# Patient Record
Sex: Female | Born: 1965 | Race: White | Hispanic: No | Marital: Married | Smoking: Never smoker
Health system: Southern US, Community
[De-identification: ages and names within clinical notes are randomized; demographics above are authoritative.]

## PROBLEM LIST (undated history)

## (undated) DIAGNOSIS — F418 Other specified anxiety disorders: Secondary | ICD-10-CM

## (undated) DIAGNOSIS — F329 Major depressive disorder, single episode, unspecified: Secondary | ICD-10-CM

## (undated) DIAGNOSIS — B009 Herpesviral infection, unspecified: Secondary | ICD-10-CM

## (undated) DIAGNOSIS — K219 Gastro-esophageal reflux disease without esophagitis: Secondary | ICD-10-CM

## (undated) DIAGNOSIS — K589 Irritable bowel syndrome without diarrhea: Secondary | ICD-10-CM

## (undated) DIAGNOSIS — D472 Monoclonal gammopathy: Secondary | ICD-10-CM

## (undated) DIAGNOSIS — R7303 Prediabetes: Secondary | ICD-10-CM

## (undated) DIAGNOSIS — G43909 Migraine, unspecified, not intractable, without status migrainosus: Secondary | ICD-10-CM

## (undated) DIAGNOSIS — L900 Lichen sclerosus et atrophicus: Secondary | ICD-10-CM

## (undated) DIAGNOSIS — G4730 Sleep apnea, unspecified: Secondary | ICD-10-CM

## (undated) DIAGNOSIS — F419 Anxiety disorder, unspecified: Secondary | ICD-10-CM

## (undated) DIAGNOSIS — L309 Dermatitis, unspecified: Secondary | ICD-10-CM

## (undated) DIAGNOSIS — F411 Generalized anxiety disorder: Secondary | ICD-10-CM

## (undated) DIAGNOSIS — N816 Rectocele: Secondary | ICD-10-CM

## (undated) DIAGNOSIS — A483 Toxic shock syndrome: Secondary | ICD-10-CM

## (undated) DIAGNOSIS — Z8619 Personal history of other infectious and parasitic diseases: Secondary | ICD-10-CM

## (undated) DIAGNOSIS — G4733 Obstructive sleep apnea (adult) (pediatric): Secondary | ICD-10-CM

## (undated) DIAGNOSIS — F32A Depression, unspecified: Secondary | ICD-10-CM

## (undated) HISTORY — DX: Irritable bowel syndrome, unspecified: K58.9

## (undated) HISTORY — PX: NASAL SEPTUM SURGERY: SHX37

## (undated) HISTORY — DX: Obstructive sleep apnea (adult) (pediatric): G47.33

## (undated) HISTORY — DX: Gastro-esophageal reflux disease without esophagitis: K21.9

## (undated) HISTORY — PX: REDUCTION MAMMAPLASTY: SUR839

## (undated) HISTORY — DX: Depression, unspecified: F32.A

## (undated) HISTORY — DX: Generalized anxiety disorder: F41.1

## (undated) HISTORY — DX: Monoclonal gammopathy: D47.2

## (undated) HISTORY — DX: Herpesviral infection, unspecified: B00.9

## (undated) HISTORY — PX: BUNIONECTOMY: SHX129

## (undated) HISTORY — DX: Other specified anxiety disorders: F41.8

## (undated) HISTORY — DX: Major depressive disorder, single episode, unspecified: F32.9

## (undated) HISTORY — DX: Hereditary hemochromatosis: E83.110

## (undated) HISTORY — DX: Anxiety disorder, unspecified: F41.9

## (undated) HISTORY — DX: Lichen sclerosus et atrophicus: L90.0

## (undated) HISTORY — DX: Toxic shock syndrome: A48.3

## (undated) HISTORY — PX: TONSILLECTOMY: SUR1361

---
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
## 1999-01-25 HISTORY — PX: CHOLECYSTECTOMY: SHX55

## 2018-11-09 ENCOUNTER — Telehealth: Payer: Self-pay | Admitting: *Deleted

## 2018-11-09 NOTE — Telephone Encounter (Signed)
Voice mail from patient requesting new patient appointment due to transfer of her hematology care to [facility] from [place]. Reports her hematologist should have sent records. She is calling to ask if we have received records yet? Would be due to be seen in January. Forwarded this message to Seth Bake, new patient scheduler.

## 2018-11-12 ENCOUNTER — Other Ambulatory Visit: Payer: Self-pay

## 2018-11-12 DIAGNOSIS — Z20822 Contact with and (suspected) exposure to covid-19: Secondary | ICD-10-CM

## 2018-11-14 LAB — NOVEL CORONAVIRUS, NAA: SARS-CoV-2, NAA: NOT DETECTED

## 2018-11-27 ENCOUNTER — Other Ambulatory Visit: Payer: Self-pay

## 2018-11-27 DIAGNOSIS — Z20822 Contact with and (suspected) exposure to covid-19: Secondary | ICD-10-CM

## 2018-11-28 LAB — NOVEL CORONAVIRUS, NAA: SARS-CoV-2, NAA: NOT DETECTED

## 2018-11-30 ENCOUNTER — Other Ambulatory Visit: Payer: Self-pay

## 2018-12-04 ENCOUNTER — Other Ambulatory Visit (HOSPITAL_COMMUNITY)
Admission: RE | Admit: 2018-12-04 | Discharge: 2018-12-04 | Disposition: A | Discharge status: Home / Self Care | Payer: BC Managed Care – PPO | Source: Ambulatory Visit | Attending: Obstetrics & Gynecology | Admitting: Obstetrics & Gynecology

## 2018-12-04 ENCOUNTER — Other Ambulatory Visit: Payer: Self-pay

## 2018-12-04 ENCOUNTER — Encounter

## 2018-12-04 ENCOUNTER — Ambulatory Visit (INDEPENDENT_AMBULATORY_CARE_PROVIDER_SITE_OTHER): Payer: BC Managed Care – PPO | Admitting: Obstetrics & Gynecology

## 2018-12-04 ENCOUNTER — Encounter: Payer: Self-pay | Admitting: Obstetrics & Gynecology

## 2018-12-04 VITALS — BP 120/68 | HR 80 | Temp 97.4°F | Resp 12 | Ht 68.5 in | Wt 208.8 lb

## 2018-12-04 DIAGNOSIS — N950 Postmenopausal bleeding: Secondary | ICD-10-CM | POA: Diagnosis not present

## 2018-12-04 DIAGNOSIS — N393 Stress incontinence (female) (male): Secondary | ICD-10-CM | POA: Diagnosis not present

## 2018-12-04 DIAGNOSIS — Z124 Encounter for screening for malignant neoplasm of cervix: Secondary | ICD-10-CM

## 2018-12-04 DIAGNOSIS — D472 Monoclonal gammopathy: Secondary | ICD-10-CM | POA: Insufficient documentation

## 2018-12-04 DIAGNOSIS — Z01419 Encounter for gynecological examination (general) (routine) without abnormal findings: Secondary | ICD-10-CM

## 2018-12-04 NOTE — Progress Notes (Addendum)
53 y.o. [id number] Married White or Caucasian female who is here as new patient.  They moved to  this year from [place] to work with Biomedical scientist.  Had not cycled for about 18 months and then had spotting in September and October.  This was proceeded by a change in her discharge prior to the bleeding.  Betsy Coder is 6 and is a Museum/gallery exhibitions officer at [employer] and plays volleyball.    H/o hemochromatosis with compound heterozygous mutation and also with monoclonal gammopathy of unknown significance.  Is going to see hematologist in January.  Is having records transferred from [place] and has touched base a hematologist in [place].  Has experienced some vulvar itching/irritatoin.  Vulvar biopsy in records was negative.  Also had endometrial biopsy in 2018 showing endocervical polyp.  Pt was unaware of this result.  No LMP recorded (lmp unknown). (Menstrual status: Irregular Periods).          Sexually active: Yes.    The current method of family planning is none.    Exercising: Yes.    walking, golf, and tennis Smoker:  no  Health Maintenance: Pap:  Unsure -- done in [place] History of abnormal Pap:  no MMG:  01/03/2018 Colonoscopy: 03/13/15 in [place], done in [place].  Negative.  Follow up 10 years. BMD:   Done in [place] TDaP:  Not UTD Pneumonia vaccine(s):  never Shingrix:   Completed Hep C testing: never Screening Labs: [place] and estradiol will be obtained   reports that she has never smoked. She has never used smokeless tobacco. She reports current alcohol use of about 3.0 - 5.0 standard drinks of alcohol per week. She reports that she does not use drugs.  Past Medical History:  Diagnosis Date  . Anxiety   . Depression   . Hemochromatosis associated with compound heterozygous mutation in HFE gene ([place])   . MGUS (monoclonal gammopathy of unknown significance)   . Toxic shock syndrome ([place])    at age 74    Past Surgical History:  Procedure Laterality Date  . BUNIONECTOMY Bilateral   .  CHOLECYSTECTOMY    . NASAL SEPTUM SURGERY    . TONSILLECTOMY      Current Outpatient Medications  Medication Sig Dispense Refill  . augmented betamethasone dipropionate (DIPROLENE-AF) 0.05 % ointment Apply topically 2 (two) times daily.    . betamethasone dipropionate 0.05 % cream Apply topically 2 (two) times daily.    Marland Kitchen buPROPion (WELLBUTRIN SR) 150 MG 12 hr tablet Take 150 mg by mouth 2 (two) times daily.    Marland Kitchen estradiol (ESTRACE) 0.5 MG tablet 0.5 mg once a week. Place one tablet vaginally once weekly    . MOMETASONE FUROATE EX Apply 0.1 % topically.    . mupirocin ointment (BACTROBAN) 2 % Place 1 application into the nose 2 (two) times daily.    . NON FORMULARY Hydrocortisone Balerate USP, 2% Ointment for lips    . omeprazole (PRILOSEC) 20 MG capsule Take 20 mg by mouth daily.    . rizatriptan (MAXALT) 10 MG tablet Take 10 mg by mouth as needed for migraine. May repeat in 2 hours if needed    . topiramate (TOPAMAX) 25 MG capsule Take 25 mg by mouth 2 (two) times daily.    Marland Kitchen triamcinolone ointment (KENALOG) 0.1 % Apply 1 application topically 2 (two) times daily.    . vitamin B-12 (CYANOCOBALAMIN) 1000 MCG tablet Take 1,000 mcg by mouth daily.    Marland Kitchen estradiol (ESTRACE) 0.1 MG/GM vaginal cream APPLY  ONE THIN LAYER TO AFFECTED AREA NIGHTLY AT BEDTIME FOR 2 WEEKS    . pimecrolimus (ELIDEL) 1 % cream Apply 1 application topically 2 (two) times daily.     No current facility-administered medications for this visit.     Family History  Problem Relation Age of Onset  . Diabetes Mother   . COPD Mother   . Congestive Heart Failure Mother   . Thyroid disease Mother   . Hemochromatosis Father   . Alcoholism Father   . Diabetes Sister   . Asthma Sister   . Asthma Brother   . Diabetes Maternal Aunt   . Breast cancer Maternal Aunt   . Diabetes Other     Review of Systems  All other systems reviewed and are negative.   Exam:   BP 120/68 (BP Location: Right Arm, Patient Position:  Sitting, Cuff Size: Normal)   Pulse 80   Temp (!) 97.4 F (36.3 C) (Temporal)   Resp 12   Ht 5' 8.5" (1.74 m)   Wt 208 lb 12.8 oz (94.7 kg)   LMP  (LMP Unknown) Comment: patient had spotting in October 2020  BMI 31.29 kg/m   Height: 5' 8.5" (174 cm)  Ht Readings from Last 3 Encounters:  12/04/18 5' 8.5" (1.74 m)   General appearance: alert, cooperative and appears stated age Head: Normocephalic, without obvious abnormality, atraumatic Neck: no adenopathy, supple, symmetrical, trachea midline and thyroid normal to inspection and palpation Lungs: clear to auscultation bilaterally Breasts: normal appearance, no masses or tenderness Heart: regular rate and rhythm Abdomen: soft, non-tender; bowel sounds normal; no masses,  no organomegaly Extremities: extremities normal, atraumatic, no cyanosis or edema Skin: Skin color, texture, turgor normal. No rashes or lesions Lymph nodes: Cervical, supraclavicular, and axillary nodes normal. No abnormal inguinal nodes palpated Neurologic: Grossly normal   Pelvic: External genitalia:  no lesions, perineal body is decreased in size due to prior vaginal delivery              Urethra:  normal appearing urethra with no masses, tenderness or lesions              Bartholins and Skenes: normal                 Vagina: normal appearing vagina with normal color and discharge, no lesions              Cervix: no lesions              Pap taken: Yes.   Bimanual Exam:  Uterus:  normal size, contour, position, consistency, mobility, non-tender              Adnexa: normal adnexa and no mass, fullness, tenderness               Rectovaginal: Confirms               Anus:  normal sphincter tone, no lesions  Chaperone was present for exam.  A:  Well Woman with normal exam Perimenopausal with bleeding the last two months H/o vulvar irritation H/o hemochromatosis.  States has records coming from [place] and then will have hematology consultation.  Declines needing  help. H/o monoclonal gammopathy of unknown significance Family hx of breast cancer in maternal aunt  P:   Mammogram guidelines reviewed.  Information about scheduling given to pt today.  Due after 01/04/2019 pap smear with HR HPV obtained today Referral to pelvic PT placed today FSH and estradiol levels obtained.  If  in menopausal range, she will need PUS and possibly endometrial biopsy Tdap needs to be updated.  Was ordered today but pt left prior to recieinvg.  Will update when she returns. Advised to use vaginal estrogen cream 1 gram pv twice weekly, stop vaginal estradiol tablet and to stop using Char[place] toilet paper

## 2018-12-05 LAB — FOLLICLE STIMULATING HORMONE: FSH: 79.6 m[IU]/mL

## 2018-12-05 LAB — ESTRADIOL: Estradiol: <5 pg/mL

## 2018-12-06 LAB — CYTOLOGY - PAP
Comment: NEGATIVE
Diagnosis: NEGATIVE
High risk HPV: NEGATIVE

## 2018-12-07 ENCOUNTER — Other Ambulatory Visit: Payer: Self-pay | Admitting: *Deleted

## 2018-12-07 DIAGNOSIS — N950 Postmenopausal bleeding: Secondary | ICD-10-CM

## 2018-12-11 ENCOUNTER — Other Ambulatory Visit: Payer: Self-pay

## 2018-12-12 ENCOUNTER — Telehealth: Payer: Self-pay | Admitting: Obstetrics & Gynecology

## 2018-12-12 NOTE — Telephone Encounter (Signed)
Call placed to convey benefits for ultrasound. Spoke with patient and conveyed benefits. Patient understands/agreeable with the benefits. Patient is aware of the cancellation policy. Appointment scheduled /

## 2018-12-13 ENCOUNTER — Encounter: Payer: Self-pay | Admitting: Obstetrics & Gynecology

## 2018-12-13 ENCOUNTER — Ambulatory Visit (INDEPENDENT_AMBULATORY_CARE_PROVIDER_SITE_OTHER): Payer: BC Managed Care – PPO | Admitting: Obstetrics & Gynecology

## 2018-12-13 ENCOUNTER — Other Ambulatory Visit: Payer: Self-pay

## 2018-12-13 ENCOUNTER — Ambulatory Visit (INDEPENDENT_AMBULATORY_CARE_PROVIDER_SITE_OTHER): Payer: BC Managed Care – PPO

## 2018-12-13 VITALS — BP 126/70 | HR 72 | Temp 96.9°F | Resp 12 | Wt 212.2 lb

## 2018-12-13 DIAGNOSIS — N950 Postmenopausal bleeding: Secondary | ICD-10-CM

## 2018-12-18 ENCOUNTER — Telehealth: Payer: Self-pay | Admitting: Obstetrics & Gynecology

## 2018-12-18 ENCOUNTER — Telehealth: Payer: Self-pay | Admitting: *Deleted

## 2018-12-18 NOTE — Telephone Encounter (Signed)
Call to patient. Provided update. Patient is aware she will be contacted once PUS images have been uploaded and reviewed by Dr. Sabra Heck. Patient thankful for update.   Routing to Dr. Lestine Box.   Encounter closed.

## 2018-12-18 NOTE — Telephone Encounter (Signed)
Patient calling to follow up after ultrasound last week 12/13/18. States the ultrasound machine was broken so unable to upload the results for Dr. Sabra Heck to review for consult.

## 2018-12-18 NOTE — Telephone Encounter (Signed)
Patient left message to inquire if records from her hematologist in [place] has been received. She is due a new patient f/u here in January. Staff message sent to new patient coordinator, Adrea w/cc to head of HIM with request to f/u and call patient.

## 2018-12-19 NOTE — Progress Notes (Signed)
Pt presented for ultrasound.  Images have not been reviewed due to technical issue with image storage.  Will have to review in person when back in office.  Pt is aware that I am out of office and images have not been electronically available.

## 2018-12-26 ENCOUNTER — Other Ambulatory Visit: Payer: Self-pay | Admitting: *Deleted

## 2018-12-26 DIAGNOSIS — N950 Postmenopausal bleeding: Secondary | ICD-10-CM

## 2018-12-27 ENCOUNTER — Other Ambulatory Visit: Payer: Self-pay

## 2018-12-27 ENCOUNTER — Ambulatory Visit (INDEPENDENT_AMBULATORY_CARE_PROVIDER_SITE_OTHER): Payer: BC Managed Care – PPO | Admitting: Obstetrics & Gynecology

## 2018-12-27 ENCOUNTER — Encounter: Payer: Self-pay | Admitting: Obstetrics & Gynecology

## 2018-12-27 ENCOUNTER — Ambulatory Visit (INDEPENDENT_AMBULATORY_CARE_PROVIDER_SITE_OTHER): Payer: BC Managed Care – PPO

## 2018-12-27 VITALS — BP 112/76 | HR 80 | Temp 97.3°F | Resp 12 | Ht 69.0 in | Wt 208.0 lb

## 2018-12-27 DIAGNOSIS — N950 Postmenopausal bleeding: Secondary | ICD-10-CM

## 2018-12-27 DIAGNOSIS — Z23 Encounter for immunization: Secondary | ICD-10-CM | POA: Diagnosis not present

## 2018-12-27 NOTE — Progress Notes (Signed)
53 y.o. [id number] Married White or Caucasian female here for pelvic ultrasound due to PMP bleeding.  She has recently moved to [place] from the [place] area.  At initial visit on 12/14/2018, she reported having intermittent spotting after going about 18 months without bleeding.  FSH is ~80 and estradiol <5.0.  Pt is aware this is in menopausal range.  Pt returned for PUS on 12/13/2018.  I was not in the office that day and planned to call her with results but due to technical issues, her images are not loaded onto Epic and hard drive of computer with ultrasound has been erased due to these difficulties.  She is here for repeat PUS.   No LMP recorded (lmp unknown). Patient is postmenopausal.  Contraception: PMP  Findings:  UTERUS: 8.3 x 5.5 x 3.5cm EMS: 3.4mm ADNEXA: Left ovary: 2.0 x 1.5 x 1.6cm with 7 x 45mm follicle       Right ovary: 2.2 x 1.0 x 1.3cm CUL DE SAC: no free fluid  Discussion:  Given endometrial thickness of 3.34mm, I feel it is ok to monitor at this point.  Pt is aware she should not be bleeding and if she does have any future bleeding, she will call because I would like to repeat her pelvic exam to help determine the cause of bleeding.  If it is endometrial, then biopsy would be done at that time.  Pt is comfortable with plan and knows to call with any future bleeding.  Questions answered.  Assessment:  PMP bleeding with 3.66mm endometrium  Plan:  If pt has future bleeding, she will call and exam with biopsy (if indicated) will be obtained at that time.  Pt is comfortable with plan.  ~15 minutes spent with patient >50% of time was in face to face discussion of above.

## 2019-01-22 ENCOUNTER — Telehealth: Payer: Self-pay | Admitting: *Deleted

## 2019-01-22 NOTE — Telephone Encounter (Signed)
Voice mail requesting update on status of her new patient appointment that is due in January 2021. Transferring her care here from [place]. Asking when is her appointment and have we received the records yet? January is her 6 month f/u being due. Sent staff message to Seth Bake, new patient referral scheduler to f/u and contact patient.

## 2019-01-29 ENCOUNTER — Other Ambulatory Visit: Payer: BC Managed Care – PPO

## 2019-01-29 ENCOUNTER — Encounter: Payer: Self-pay | Admitting: Oncology

## 2019-01-29 ENCOUNTER — Telehealth: Payer: Self-pay | Admitting: Oncology

## 2019-01-29 NOTE — Progress Notes (Signed)
[place]  [phone] [phone]     ID: Gwendolyn Ramos DOB: Oct 11, 1965  MR#: [id number]  [id number]  Patient Care Team: [name], MD as PCP - General (Family Medicine) Ginny Loomer, Virgie Dad, MD as Consulting Physician (Oncology) Chauncey Cruel, MD OTHER MD:  CHIEF COMPLAINT: MGUS; hereditary hemochromatosis  CURRENT TREATMENT: Observation   HISTORY OF CURRENT ILLNESS: Gwendolyn Ramos has a history of monoclonal gammopathy and hereditary hemochromatosis. She has been followed by Dr. Wilnette Kales since 2016 while living in [place].   She was tested for hemochromatosis because her father had that problem.  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  Dr. Bridgett Larsson has been following her every 6 months.  His most recent ferritin on June 26, 2018 was 37 and her transferrin saturation was 55%.  The patient also has an IgG kappa MGUS.  This is also being followed every 6 months.  Her most recent IgG level was 2577 with the kappa light chains at 52.4, both of those on June 26, 2018  The patient is here today to establish herself in our care  INTERVAL HISTORY: Gwendolyn Ramos was evaluated in the hematology clinic on 01/30/2019.    REVIEW OF SYSTEMS: Gwendolyn Ramos has a history of migraine headaches which are moderately well controlled.  She has some reflux symptoms.  Otherwise she denies visual changes, nausea, vomiting, stiff neck, dizziness, or gait imbalance. There has been no recent cough, phlegm production, or pleurisy, no chest pain or pressure, and no change in bowel or bladder habits. The patient denies fever, rash, bleeding, unexplained fatigue or unexplained weight loss.  She exercises by walking 3 to 4 miles most mornings and by playing golf and tennis.  A detailed review of systems was otherwise entirely negative.   PAST MEDICAL HISTORY: Past Medical History:    Diagnosis Date  . Anxiety   . Depression   . GERD (gastroesophageal reflux disease)   . Hemochromatosis associated with compound heterozygous mutation in HFE gene ([place])   . IBS (irritable bowel syndrome)   . MGUS (monoclonal gammopathy of unknown significance)   . Toxic shock syndrome ([place])    at age 3    PAST SURGICAL HISTORY: Past Surgical History:  Procedure Laterality Date  . BUNIONECTOMY Bilateral   . CHOLECYSTECTOMY  2001  . NASAL SEPTUM SURGERY    . TONSILLECTOMY      FAMILY HISTORY: Family History  Problem Relation Age of Onset  . Diabetes Mother   . COPD Mother   . Congestive Heart Failure Mother   . Thyroid disease Mother   . Hemochromatosis Father   . Alcoholism Father   . Diabetes Sister   . Asthma Sister   . Asthma Brother   . Diabetes Maternal Aunt   . Breast cancer Maternal Aunt   . Diabetes Other    Patient's father was 61 years old when he died from alcoholic cirrhosis in the setting of hemochromatosis. Patient's mother is 35 years old as of January 2021.  The patient has 3 siblings, 2 full brothers and 1 half sister.  The patient's brothers have not been tested for hemochromatosis.  The patient's sister is by different father   GYNECOLOGIC HISTORY:  No LMP recorded (lmp unknown). Patient is postmenopausal. Menarche: 54 years old Age at first live birth: 54 years old GX P 2 HRT no Hysterectomy? no BSO? 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  Her husband [name] runs [employer] and does a great deal of international traveling.  Their 2 daughters are [name], 20, studying business at [employer], and [place], [place], [place] psychology at [employer].    ADVANCED DIRECTIVES: In the absence of any documents to the contrary the patient's husband is her healthcare power of attorney   HEALTH MAINTENANCE: Social History   Tobacco Use  . Smoking status: Never Smoker  . Smokeless tobacco: Never Used   Substance Use Topics  . Alcohol use: Yes    Alcohol/week: 3.0 - 5.0 standard drinks    Types: 3 - 5 Standard drinks or equivalent per week  . Drug use: Never     Colonoscopy: 2017  PAP: Up-to-date  Bone density:    Allergies  Allergen Reactions  . Levaquin [Levofloxacin]     High fever, elevated heart rate  . Methylchloroisothiazolinone [Methylisothiazolinone]     Current Outpatient Medications  Medication Sig Dispense Refill  . buPROPion (WELLBUTRIN SR) 150 MG 12 hr tablet Take 150 mg by mouth 2 (two) times daily.    Marland Kitchen estradiol (ESTRACE) 0.1 MG/GM vaginal cream APPLY ONE THIN LAYER TO AFFECTED AREA NIGHTLY AT BEDTIME FOR 2 WEEKS    . vitamin B-12 (CYANOCOBALAMIN) 1000 MCG tablet Take 1,000 mcg by mouth daily.    Marland Kitchen augmented betamethasone dipropionate (DIPROLENE-AF) 0.05 % ointment Apply topically 2 (two) times daily.    . betamethasone dipropionate 0.05 % cream Apply topically 2 (two) times daily.    . MOMETASONE FUROATE EX Apply 0.1 % topically.    . mupirocin ointment (BACTROBAN) 2 % Place 1 application into the nose 2 (two) times daily.    . NON FORMULARY Hydrocortisone Balerate USP, 2% Ointment for lips    . omeprazole (PRILOSEC) 20 MG capsule Take 20 mg by mouth daily.    . pimecrolimus (ELIDEL) 1 % cream Apply 1 application topically 2 (two) times daily.    . rizatriptan (MAXALT) 10 MG tablet Take 10 mg by mouth as needed for migraine. May repeat in 2 hours if needed    . topiramate (TOPAMAX) 25 MG capsule Take 25 mg by mouth 2 (two) times daily.    Marland Kitchen triamcinolone ointment (KENALOG) 0.1 % Apply 1 application topically 2 (two) times daily.     No current facility-administered medications for this visit.    OBJECTIVE: Middle-aged white woman in no acute distress  Vitals:   01/30/19 1338  BP: 135/70  Pulse: 80  Resp: 18  Temp: 98.7 F (37.1 C)  SpO2: 98%     Body mass index is 31.79 kg/m.   Wt Readings from Last 3 Encounters:  01/30/19 215 lb 4.8 oz (97.7 kg)   12/27/18 208 lb (94.3 kg)  12/13/18 212 lb 3.2 oz (96.3 kg)      ECOG FS:1 - Symptomatic but completely ambulatory  Ocular: Sclerae unicteric, pupils round and equal Ear-nose-throat: Wearing a mask Lymphatic: No cervical or supraclavicular adenopathy Lungs no rales or rhonchi Heart regular rate and rhythm Abd soft, nontender, positive bowel sounds MSK no focal spinal tenderness, no joint edema Neuro: non-focal, well-oriented, appropriate affect Breasts: Deferred   LAB RESULTS:  CMP  No results found for: NA, K, CL, CO2, GLUCOSE, BUN, CREATININE, CALCIUM, PROT, ALBUMIN, AST, ALT, ALKPHOS, BILITOT, GFRNONAA, GFRAA  No results found for: TOTALPROTELP, ALBUMINELP, A1GS, A2GS, BETS, BETA2SER, GAMS, MSPIKE, SPEI  No results found for: WBC, NEUTROABS, HGB, HCT, MCV, PLT  No results found for: LABCA2  No components found for: [id number]  No results for input(s): INR in the last 168 hours.  No results found for: LABCA2  No results found for: [id number]  No results found for: [id number]  No results found for: [id number]  No results found for: CA2729  No components found for: HGQUANT  No results found for: CEA1 / No results found for: CEA1   No results found for: AFPTUMOR  No results found for: CHROMOGRNA  No results found for: KPAFRELGTCHN, LAMBDASER, KAPLAMBRATIO (kappa/lambda light chains)  No results found for: HGBA, HGBA2QUANT, HGBFQUANT, HGBSQUAN (Hemoglobinopathy evaluation)   No results found for: LDH  No results found for: IRON, TIBC, IRONPCTSAT (Iron and TIBC)  No results found for: FERRITIN  Urinalysis No results found for: COLORURINE, APPEARANCEUR, LABSPEC, PHURINE, GLUCOSEU, HGBUR, BILIRUBINUR, KETONESUR, PROTEINUR, UROBILINOGEN, NITRITE, LEUKOCYTESUR   STUDIES: No results found.   ELIGIBLE FOR AVAILABLE RESEARCH PROTOCOL: no  ASSESSMENT: 54 y.o. [place] woman with the following concerns:  (1) IgG kappa MGUS  (a) plan is to check SPEP/Lambda light  chains every 6 months  (2) hemochromatosis: Compound heterozygote: C282Y and H63D positive  (a) plan is to check ferritin and hemoglobin saturation every 6 months  (b) consider phlebotomy once ferritin greater than 150 or saturation greater than 80%  PLAN: I discussed with  Ceriah the fact that her body does not have a mechanism to stop absorbing iron once she has iron overload.  Her body like everyone else's body also has no mechanism to excrete iron other than bleeding.  She is now clearly postmenopausal (on 12/04/2018 estradiol was less than 5 and FSH 79.6).  Accordingly she is not going to have any further periods and therefore whereas before she was essentially losing iron every month now every bit of iron she eats will accumulate in her body.  The net effect of that can be severe damage to the liver, which is likely what happened to her father, but also to the heart and other organs.  Accordingly she was cautioned to eat a low iron diet, not cook in iron utensils, not eat red meat, and avoid vitamins with iron in them.  We will monitor her iron stores and iron saturation and consider phlebotomy as noted above.  She does not need anything at this point will be on observation  We also discussed her MGUS.  She understands that this means she has a clonal plasma cells all of which make the same antibody.  This is detectable on electrophoresis.  This clone may remain stable or it may progress.  Accordingly it needs follow-up and we will be checking her labs every 6 months at the same time as we do the hemochromatosis tests.  If there is steady progression in her M spike she may require treatment at some point in the future.  We decided she would have labs every February and labs plus a visit every August.  She is comfortable with this plan  I strongly recommend that she get her brothers tested for hemochromatosis so they can be treated appropriately if positive.  Her own children, both girls,  probably will not need to be tested until after they are done with childbirth.  I suspect by then hemochromatosis treatment will be very different from today since the pathophysiology of iron absorption has only just been elucidated over the past 5 years  Total time this visit 65 minutes   Chauncey Cruel, MD   01/30/2019 2:35 PM  Medical Oncology and Hematology [facility] [place], [place] [address] Tel. [phone]    Fax. [phone]   This document serves as a record of services personally performed by Lurline Del, MD. It was created on his behalf by Wilburn Mylar, a trained medical scribe. The creation of this record is based on the scribe's personal observations and the provider's statements to them.   I, Lurline Del MD, have reviewed the above documentation for accuracy and completeness, and I agree with the above.

## 2019-01-29 NOTE — Telephone Encounter (Signed)
Gwendolyn Ramos has been cld and scheduled to see Dr. Jana Hakim on 1/6 at 2pm with labs at 1:30pm. Pt aware to arrive 15 minutes early.

## 2019-01-30 ENCOUNTER — Inpatient Hospital Stay: Payer: BC Managed Care – PPO | Attending: Oncology | Admitting: Oncology

## 2019-01-30 ENCOUNTER — Other Ambulatory Visit: Payer: Self-pay

## 2019-01-30 VITALS — BP 135/70 | HR 80 | Temp 98.7°F | Resp 18 | Ht 69.0 in | Wt 215.3 lb

## 2019-01-30 DIAGNOSIS — Z79899 Other long term (current) drug therapy: Secondary | ICD-10-CM | POA: Insufficient documentation

## 2019-01-30 DIAGNOSIS — Z811 Family history of alcohol abuse and dependence: Secondary | ICD-10-CM

## 2019-01-30 DIAGNOSIS — G43909 Migraine, unspecified, not intractable, without status migrainosus: Secondary | ICD-10-CM | POA: Diagnosis not present

## 2019-01-30 DIAGNOSIS — Z8249 Family history of ischemic heart disease and other diseases of the circulatory system: Secondary | ICD-10-CM

## 2019-01-30 DIAGNOSIS — Z8349 Family history of other endocrine, nutritional and metabolic diseases: Secondary | ICD-10-CM | POA: Diagnosis not present

## 2019-01-30 DIAGNOSIS — Z833 Family history of diabetes mellitus: Secondary | ICD-10-CM

## 2019-01-30 DIAGNOSIS — Z881 Allergy status to other antibiotic agents status: Secondary | ICD-10-CM | POA: Insufficient documentation

## 2019-01-30 DIAGNOSIS — D472 Monoclonal gammopathy: Secondary | ICD-10-CM | POA: Diagnosis not present

## 2019-01-30 DIAGNOSIS — Z832 Family history of diseases of the blood and blood-forming organs and certain disorders involving the immune mechanism: Secondary | ICD-10-CM | POA: Insufficient documentation

## 2019-01-30 DIAGNOSIS — Z90722 Acquired absence of ovaries, bilateral: Secondary | ICD-10-CM | POA: Diagnosis not present

## 2019-01-30 DIAGNOSIS — Z836 Family history of other diseases of the respiratory system: Secondary | ICD-10-CM | POA: Insufficient documentation

## 2019-01-30 DIAGNOSIS — Z803 Family history of malignant neoplasm of breast: Secondary | ICD-10-CM | POA: Diagnosis not present

## 2019-01-30 DIAGNOSIS — Z7289 Other problems related to lifestyle: Secondary | ICD-10-CM | POA: Insufficient documentation

## 2019-01-31 ENCOUNTER — Telehealth: Payer: Self-pay | Admitting: Oncology

## 2019-01-31 ENCOUNTER — Encounter: Payer: Self-pay | Admitting: Obstetrics & Gynecology

## 2019-01-31 NOTE — Telephone Encounter (Signed)
I talk with patient regarding schedule  

## 2019-03-08 ENCOUNTER — Ambulatory Visit: Payer: BC Managed Care – PPO

## 2019-03-08 ENCOUNTER — Telehealth: Payer: Self-pay | Admitting: Oncology

## 2019-03-08 NOTE — Telephone Encounter (Signed)
Rescheduled per 2/12 sch msg, pt req. Called and spoke with pt, confirmed 2/15 appt

## 2019-03-11 ENCOUNTER — Other Ambulatory Visit: Payer: Self-pay

## 2019-03-11 ENCOUNTER — Inpatient Hospital Stay: Payer: BC Managed Care – PPO | Attending: Oncology

## 2019-03-11 ENCOUNTER — Inpatient Hospital Stay: Payer: BC Managed Care – PPO

## 2019-03-11 DIAGNOSIS — D472 Monoclonal gammopathy: Secondary | ICD-10-CM | POA: Diagnosis present

## 2019-03-11 LAB — CBC WITH DIFFERENTIAL/PLATELET
Abs Immature Granulocytes: 0.03 10*3/uL (ref 0.00–0.07)
Basophils Absolute: 0.1 10*3/uL (ref 0.0–0.1)
Basophils Relative: 1 %
Eosinophils Absolute: 0.2 10*3/uL (ref 0.0–0.5)
Eosinophils Relative: 2 %
HCT: 42 % (ref 36.0–46.0)
Hemoglobin: 14.1 g/dL (ref 12.0–15.0)
Immature Granulocytes: 0 %
Lymphocytes Relative: 35 %
Lymphs Abs: 3.6 10*3/uL (ref 0.7–4.0)
MCH: 31.2 pg (ref 26.0–34.0)
MCHC: 33.6 g/dL (ref 30.0–36.0)
MCV: 92.9 fL (ref 80.0–100.0)
Monocytes Absolute: 0.9 10*3/uL (ref 0.1–1.0)
Monocytes Relative: 9 %
Neutro Abs: 5.3 10*3/uL (ref 1.7–7.7)
Neutrophils Relative %: 53 %
Platelets: 308 10*3/uL (ref 150–400)
RBC: 4.52 MIL/uL (ref 3.87–5.11)
RDW: 13.9 % (ref 11.5–15.5)
WBC: 10.1 10*3/uL (ref 4.0–10.5)
nRBC: 0 % (ref 0.0–0.2)

## 2019-03-11 LAB — COMPREHENSIVE METABOLIC PANEL
ALT: 36 U/L (ref 0–44)
AST: 20 U/L (ref 15–41)
Albumin: 4.2 g/dL (ref 3.5–5.0)
Alkaline Phosphatase: 57 U/L (ref 38–126)
Anion gap: 8 (ref 5–15)
BUN: 17 mg/dL (ref 6–20)
CO2: 24 mmol/L (ref 22–32)
Calcium: 9.7 mg/dL (ref 8.9–10.3)
Chloride: 107 mmol/L (ref 98–111)
Creatinine, Ser: 0.99 mg/dL (ref 0.44–1.00)
GFR calc Af Amer: >60 mL/min (ref >60)
GFR calc non Af Amer: >60 mL/min (ref >60)
Glucose, Bld: 92 mg/dL (ref 70–99)
Potassium: 4.4 mmol/L (ref 3.5–5.1)
Sodium: 139 mmol/L (ref 135–145)
Total Bilirubin: 0.3 mg/dL (ref 0.3–1.2)
Total Protein: 8.7 g/dL — ABNORMAL HIGH (ref 6.5–8.1)

## 2019-03-11 LAB — IRON AND TIBC
Iron: 65 ug/dL (ref 41–142)
Saturation Ratios: 27 % (ref 21–57)
TIBC: 243 ug/dL (ref 236–444)
UIBC: 177 ug/dL (ref 120–384)

## 2019-03-11 LAB — FERRITIN: Ferritin: 75 ng/mL (ref 11–307)

## 2019-03-12 LAB — KAPPA/LAMBDA LIGHT CHAINS
Kappa free light chain: 42.2 mg/L — ABNORMAL HIGH (ref 3.3–19.4)
Kappa, lambda light chain ratio: 4.85 — ABNORMAL HIGH (ref 0.26–1.65)
Lambda free light chains: 8.7 mg/L (ref 5.7–26.3)

## 2019-03-14 ENCOUNTER — Other Ambulatory Visit: Payer: Self-pay | Admitting: Oncology

## 2019-03-14 ENCOUNTER — Encounter: Payer: Self-pay | Admitting: Oncology

## 2019-03-14 LAB — MULTIPLE MYELOMA PANEL, SERUM
Albumin SerPl Elph-Mcnc: 4.3 g/dL (ref 2.9–4.4)
Albumin/Glob SerPl: 1.2 (ref 0.7–1.7)
Alpha 1: 0.2 g/dL (ref 0.0–0.4)
Alpha2 Glob SerPl Elph-Mcnc: 0.6 g/dL (ref 0.4–1.0)
B-Globulin SerPl Elph-Mcnc: 0.8 g/dL (ref 0.7–1.3)
Gamma Glob SerPl Elph-Mcnc: 1.9 g/dL — ABNORMAL HIGH (ref 0.4–1.8)
Globulin, Total: 3.6 g/dL (ref 2.2–3.9)
IgA: 57 mg/dL — ABNORMAL LOW (ref 87–352)
IgG (Immunoglobin G), Serum: 2371 mg/dL — ABNORMAL HIGH (ref 586–1602)
IgM (Immunoglobulin M), Srm: 64 mg/dL (ref 26–217)
M Protein SerPl Elph-Mcnc: 1.5 g/dL — ABNORMAL HIGH
Total Protein ELP: 7.9 g/dL (ref 6.0–8.5)
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
## 2019-04-05 ENCOUNTER — Encounter: Payer: Self-pay | Admitting: Neurology

## 2019-04-08 ENCOUNTER — Other Ambulatory Visit: Payer: Self-pay

## 2019-04-08 ENCOUNTER — Telehealth (INDEPENDENT_AMBULATORY_CARE_PROVIDER_SITE_OTHER): Payer: BC Managed Care – PPO | Admitting: Neurology

## 2019-04-08 VITALS — Ht 68.0 in | Wt 203.0 lb

## 2019-04-08 DIAGNOSIS — G43709 Chronic migraine without aura, not intractable, without status migrainosus: Secondary | ICD-10-CM

## 2019-04-08 MED ORDER — UBRELVY 100 MG PO TABS: 100.0000 mg | ORAL_TABLET | ORAL | 0 refills | Status: DC | PRN

## 2019-04-08 MED ORDER — RIZATRIPTAN BENZOATE 10 MG PO TABS: 10.0000 mg | ORAL_TABLET | ORAL | 3 refills | Status: DC | PRN

## 2019-04-08 MED ORDER — TOPIRAMATE 25 MG PO TABS: 75.0000 mg | ORAL_TABLET | Freq: Every day | ORAL | 3 refills | Status: DC

## 2019-04-08 NOTE — Patient Instructions (Signed)
1.  For preventative management, increase topiramate to 75mg  at bedtime 2.  For abortive therapy, refill rizatriptan but also will have her try Ubrelvy 100mg .  She will stop Aleve 3.  Limit use of pain relievers to no more than 2 days out of week to prevent risk of rebound or medication-overuse headache. 4.  Keep headache diary 5.  Exercise, hydration, caffeine cessation, sleep hygiene, monitor for and avoid triggers 6. Follow up 4 months.
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
## 2019-04-16 ENCOUNTER — Other Ambulatory Visit: Payer: Self-pay

## 2019-04-16 ENCOUNTER — Ambulatory Visit: Payer: BC Managed Care – PPO

## 2019-04-16 ENCOUNTER — Ambulatory Visit
Admission: RE | Admit: 2019-04-16 | Discharge: 2019-04-16 | Disposition: A | Discharge status: Home / Self Care | Payer: BC Managed Care – PPO | Source: Ambulatory Visit | Attending: Oncology | Admitting: Oncology

## 2019-04-16 DIAGNOSIS — D472 Monoclonal gammopathy: Secondary | ICD-10-CM

## 2019-04-18 ENCOUNTER — Telehealth: Payer: Self-pay

## 2019-04-18 ENCOUNTER — Other Ambulatory Visit: Payer: Self-pay | Admitting: Neurology

## 2019-04-18 ENCOUNTER — Encounter: Payer: Self-pay | Admitting: *Deleted

## 2019-04-18 MED ORDER — UBRELVY 100 MG PO TABS: 1.0000 | ORAL_TABLET | ORAL | 3 refills | Status: DC | PRN

## 2019-04-18 NOTE — Telephone Encounter (Signed)
Per [facility] they will send prior authorization for cover my meds.

## 2019-04-18 NOTE — Progress Notes (Signed)
Gwendolyn Ramos (Key: [id number]) Rx #[id number] [name] 100MG  tablets   Form [employer] [place] [employer] Electronic Request Form (CB)  Patient Copay Assistance  Created 5 hours ago Sent to Plan 10 minutes ago Plan Response 10 minutes ago Submit Clinical Questions less than a minute ago Determination Favorable less than a minute ago

## 2019-04-18 NOTE — Telephone Encounter (Signed)
Received authorization for [employer] . Please let patient know. [employer]

## 2019-07-04 ENCOUNTER — Other Ambulatory Visit: Payer: Self-pay | Admitting: Neurology

## 2019-07-04 ENCOUNTER — Other Ambulatory Visit: Payer: Self-pay

## 2019-07-04 MED ORDER — UBRELVY 100 MG PO TABS: 1.0000 | ORAL_TABLET | ORAL | 3 refills | Status: DC | PRN

## 2019-07-04 NOTE — Telephone Encounter (Signed)
Refill sent to pharmacy.   

## 2019-07-04 NOTE — Telephone Encounter (Signed)
Patient called in needing a refill for her [name] sent to the pharmacy

## 2019-07-08 ENCOUNTER — Telehealth: Payer: Self-pay | Admitting: Obstetrics & Gynecology

## 2019-07-08 ENCOUNTER — Ambulatory Visit: Payer: BC Managed Care – PPO | Admitting: Obstetrics & Gynecology

## 2019-07-08 ENCOUNTER — Other Ambulatory Visit: Payer: Self-pay

## 2019-07-08 ENCOUNTER — Encounter: Payer: Self-pay | Admitting: Obstetrics & Gynecology

## 2019-07-08 VITALS — BP 128/80 | HR 84 | Temp 97.5°F | Ht 69.0 in | Wt 211.0 lb

## 2019-07-08 DIAGNOSIS — N766 Ulceration of vulva: Secondary | ICD-10-CM

## 2019-07-08 DIAGNOSIS — R21 Rash and other nonspecific skin eruption: Secondary | ICD-10-CM

## 2019-07-08 MED ORDER — GABAPENTIN 100 MG PO CAPS: 100.0000 mg | ORAL_CAPSULE | Freq: Three times a day (TID) | ORAL | 0 refills | Status: DC

## 2019-07-08 MED ORDER — VALACYCLOVIR HCL 1 G PO TABS: 1000.0000 mg | ORAL_TABLET | Freq: Two times a day (BID) | ORAL | 0 refills | Status: DC

## 2019-07-08 MED ORDER — LIDOCAINE 5 % EX OINT: 1.0000 "application " | TOPICAL_OINTMENT | Freq: Four times a day (QID) | CUTANEOUS | 0 refills | Status: DC | PRN

## 2019-07-08 NOTE — Telephone Encounter (Signed)
Patient returned call requesting to speak with triage. Call transferred from front office. Spoke with patient. Patient reports a itchy rash on upper labia/vulva. Burns when urine touches the skin. Symptoms started 07/03/19, has been getting worse over the past few days. Can't walk due to the swelling and discomfort. Has been sitting with ice and taking OTC Advil for relief. She has also tried OTC yeast cream topically and Kenalog cream, no change in symptoms. Patient currently has 2 cold sores on her mouth. Returned from [place] 1 wk ago, was in a wet bathing suit most of the time. No recent waxing or shaving. No partner changes or product changes. No vaginal d/c or drainage. Patient crying and requesting OV today. Placed patient on brief hold to review schedule with nursing supervisor. OV scheduled for today at 4:15 pm with Dr. Sabra Heck. Patient is aware she is being worked into the schedule.  Covid 19 prescreen negative, precautions reviewed.   Routing to provider for final review. Patient is agreeable to disposition. Will close encounter.

## 2019-07-08 NOTE — Telephone Encounter (Signed)
Patient is having some swelling and rash on labia. Patient would like an appointment today if possible.

## 2019-07-08 NOTE — Progress Notes (Signed)
GYNECOLOGY  VISIT  CC:   Patient states that she had vulvar itching last week. Per patient "progressively getting worse" getting swollen with "red bumps". Patient states that she also has two fever blisters and eczema flare.  HPI: 54 y.o. G34P2012 Married White or Caucasian female here for painful vaginal rash that started about a week ago.  She and husband recently returned from trip to [place].  She's has two facial fever blisters as well and has never had two at the same time.  Had some initial itching and used antifungal cream she has at home.  This hasn't really helped.  Pain seems to be worsening and there is a little discharge.  Denies vaginal bleeding.  Has some inguinal soreness as well.  GYNECOLOGIC HISTORY: No LMP recorded (lmp unknown). Patient is postmenopausal. Contraception: Postmenopausal Menopausal hormone therapy: Estrace  Patient Active Problem List   Diagnosis Date Noted  . MGUS (monoclonal gammopathy of unknown significance)   . 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 Anxiety   . Depression   . GERD (gastroesophageal reflux disease)   . 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 IBS (irritable bowel syndrome)   . MGUS (monoclonal gammopathy of unknown significance)   . Toxic shock syndrome ([place])    at age 22    Past Surgical History:  Procedure Laterality Date  . BUNIONECTOMY Bilateral   . CHOLECYSTECTOMY  2001  . NASAL SEPTUM SURGERY    . TONSILLECTOMY      MEDS:   Current Outpatient Medications on File Prior to Visit  Medication Sig Dispense Refill  . augmented betamethasone dipropionate (DIPROLENE-AF) 0.05 % ointment Apply topically 2 (two) times daily.    . betamethasone dipropionate 0.05 % cream Apply topically 2 (two) times daily.    Marland Kitchen buPROPion (WELLBUTRIN SR) 150 MG 12 hr tablet Take 150 mg by mouth 2 (two) times daily.     . clotrimazole (GYNE-LOTRIMIN 3) 2 % vaginal cream Place 1 Applicatorful vaginally at bedtime.    . NON FORMULARY Hydrocortisone Balerate USP, 2% Ointment for lips    . nystatin-triamcinolone ointment (MYCOLOG) Apply 1 application topically 2 (two) times daily.    Marland Kitchen omeprazole (PRILOSEC) 20 MG capsule Take 20 mg by mouth daily.    . rizatriptan (MAXALT) 10 MG tablet Take 1 tablet (10 mg total) by mouth as needed for migraine. May repeat in 2 hours if needed 10 tablet 3  . topiramate (TOPAMAX) 25 MG tablet Take 3 tablets (75 mg total) by mouth at bedtime. 90 tablet 3  . triamcinolone ointment (KENALOG) 0.1 % Apply 1 application topically 2 (two) times daily.    Marland Kitchen Ubrogepant (UBRELVY) 100 MG TABS Take 1 tablet by mouth as needed (May repeat in 2 hours if needed.  Maximum 2 tablets in 24 hours.). 10 tablet 3  . vitamin B-12 (CYANOCOBALAMIN) 1000 MCG tablet Take 1,000 mcg by mouth daily.    Marland Kitchen estradiol (ESTRACE) 0.1 MG/GM vaginal cream APPLY ONE THIN LAYER TO AFFECTED AREA NIGHTLY AT BEDTIME FOR 2 WEEKS (Patient not taking: Reported on 07/08/2019)    . MOMETASONE FUROATE EX Apply 0.1 % topically. (Patient not taking: Reported on 07/08/2019)    . mupirocin ointment (BACTROBAN) 2 % Place 1 application into the nose 2 (two) times daily. (Patient not taking: Reported on 07/08/2019)    . pimecrolimus (ELIDEL) 1 % cream Apply 1  application topically 2 (two) times daily. (Patient not taking: Reported on 07/08/2019)     No current facility-administered medications on file prior to visit.    ALLERGIES: Levaquin [levofloxacin] and Methylchloroisothiazolinone [methylisothiazolinone]  Family History  Problem Relation Age of Onset  . Diabetes Mother   . COPD Mother   . Congestive Heart Failure Mother   . Thyroid disease Mother   . Hemochromatosis Father   . Alcoholism Father   . Diabetes Sister   . Asthma Sister   . Asthma Brother   . Diabetes Maternal Aunt   . Breast cancer Maternal Aunt   . Diabetes  Other     SH:  Married, non smoker  Review of Systems  Genitourinary:       Vulvar irritation Vulvar rash  All other systems reviewed and are negative.   PHYSICAL EXAMINATION:    BP 128/80 (BP Location: Right Arm, Patient Position: Sitting, Cuff Size: Normal)   Pulse 84   Temp (!) 97.5 F (36.4 C) (Temporal)   Ht 5\' 9"  (1.753 m)   Wt 211 lb (95.7 kg)   LMP  (LMP Unknown) Comment: patient had spotting in October 2020  BMI 31.16 kg/m     General appearance: alert, cooperative and appears stated age Lymph:  no inguinal LAD noted  Pelvic: External genitalia:  Quarter sized region of small clustered ulcerations on inner left labia majora, c/w HSV, crusty appearing drainage at lesion also noted              Urethra:  normal appearing urethra with no masses, tenderness or lesions              Bartholins and Skenes: normal                 Vagina: normal appearing vagina with normal color and discharge, no lesions              Cervix: no lesions              Bimanual Exam:  Uterus:  normal size, contour, position, consistency, mobility, non-tender              Adnexa: no mass, fullness, tenderness              Anus:  no lesions  Chaperone, Terence Lux, CMA, was present for exam.  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

## 2019-07-09 ENCOUNTER — Other Ambulatory Visit: Payer: Self-pay | Admitting: Neurology

## 2019-07-09 LAB — HSV DNA BY PCR (REFERENCE LAB)
HSV 2 DNA: NEGATIVE
HSV-1 DNA: POSITIVE — AB

## 2019-07-11 ENCOUNTER — Telehealth: Payer: Self-pay

## 2019-07-11 LAB — WOUND CULTURE

## 2019-07-11 MED ORDER — AMOXICILLIN 500 MG PO CAPS: 500.0000 mg | ORAL_CAPSULE | Freq: Four times a day (QID) | ORAL | 0 refills | Status: AC

## 2019-07-11 MED ORDER — FLUCONAZOLE 150 MG PO TABS: 150.0000 mg | ORAL_TABLET | Freq: Once | ORAL | 0 refills | Status: AC

## 2019-07-11 NOTE — Telephone Encounter (Signed)
Spoke with pt. Pt given results and recommendations per Dr Sabra Heck. Pt agreeable and verbalized understanding. Pt agreeable to Rx. She states she is still itching. Pt states hoping the Rx will help resolve all her sx. Advised will give update to Dr Sabra Heck. Pt to return call to office if not completely resolved. Pt agreeable.   Routing to Dr Sabra Heck for review.  Encounter closed. Rx sent to pharmacy on file.

## 2019-07-11 NOTE — Telephone Encounter (Signed)
-----   Message from Megan Salon, MD sent at 07/11/2019  7:09 AM EDT ----- Please let pt know her HSV 1 testing was normal.  This is typically the HSV that comes from facial fever blisters.  She is on valtrex.  I did also do a skin culture and it is positive.  She should be treated with amoxicillin 500mg  qid x 5 days.  Also send in rx for diflucan 150mg  po x 1, if gets any itching with the amoxicillin as it can cause yeast.  I would like to see her for follow up one day next week.  Can you please let me know she is doing as well.  She is aware I thought this was HSV.  Thanks.

## 2019-07-12 ENCOUNTER — Telehealth: Payer: Self-pay

## 2019-07-12 MED ORDER — AMOXICILLIN-POT CLAVULANATE 875-125 MG PO TABS: 1.0000 | ORAL_TABLET | Freq: Two times a day (BID) | ORAL | 0 refills | Status: AC

## 2019-07-12 NOTE — Telephone Encounter (Signed)
-----   Message from Megan Salon, MD sent at 07/11/2019 11:36 PM EDT ----- Routing this to [place] who contacted pt previously about results.  Please let pt know the skin culture grew a second bacteria-- e coli.  This is in addition to the GBS.  The e coli is resistant to the amoxicillin so need ot change antibiotics to Augmentin 875 mg bid x 5 days.  I'm sorry I've had to change the antibiotics.

## 2019-07-12 NOTE — Telephone Encounter (Signed)
Spoke with pt. Pt given update on results per Dr Sabra Heck. Pt agreeable and verbalized understanding. New Rx of Augmentin sent to pharmacy on file. Pt aware to change abx today. Pt verbalized understanding.   Routing to Dr Sabra Heck for review Encounter closed.

## 2019-07-18 ENCOUNTER — Encounter: Payer: Self-pay | Admitting: Obstetrics & Gynecology

## 2019-07-19 NOTE — Telephone Encounter (Signed)
Pt sent following Mychart message:   Gwendolyn Ramos, Gwendolyn Ramos to Megan Salon, MD   SW  07/18/19 1:18 PM Hi Dr. Sabra Heck,  well, those tests couldn't have gone much worse.  I've completed all my meds. You had mentioned staying on the valtryx for 6 months.  Is this something you want me to try?  I already feel like I might have another cold sore coming on my lip, but it isn't where I normally get it so it could be a zit. I think I have cleared up on my labia, but I do still get itching sometimes, which is how I got here in the first place.  Not sure how to proceed.    Thanks.

## 2019-07-22 ENCOUNTER — Telehealth: Payer: Self-pay

## 2019-07-22 NOTE — Telephone Encounter (Signed)
Last OV 07/08/19  Spoke with pt. Pt calling to give update to Dr Sabra Heck for vuvlar rash that has been going on since beginning of June when returned from [place]. Pt states today still having slight external burning sensation with no visible lesions or bumps. Pt states took all of Valtrex Rx for 10 days and finished on 07/17/19. Pt denies any UTI sx. Pt states daughter now exhibiting same sx since trip. Made daughter new pt appt here soon with Dr Sabra Heck. Pt states needing RF on Valtrex Rx. Reviewed pt's chart and nothing noted. Advised pt to discuss at [place]. Pt agreeable.   Pt advised to have OV for follow up since still having sx. Pt states is only wanting to see Dr Sabra Heck. Declines OV this week with another provider. Pt scheduled with Dr Sabra Heck on 07/30/19 at 3:15pm as work-in appt. Pt agreeable and verbalized understanding of date and time of appt.   Routing to Dr Sabra Heck for review.  Encounter closed.

## 2019-07-22 NOTE — Telephone Encounter (Signed)
Patient is calling in regards to having some follow up questions.

## 2019-07-23 NOTE — Progress Notes (Deleted)
GYNECOLOGY  VISIT  CC:   ***  HPI: 54 y.o. G22P2012 Married White or Caucasian female here for vulvar rash f/u.  GYNECOLOGIC HISTORY: No LMP recorded (lmp unknown). Patient is postmenopausal. Contraception: post menopausal Menopausal hormone therapy: estrace  Patient Active Problem List   Diagnosis Date Noted  . MGUS (monoclonal gammopathy of unknown significance)   . 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 Anxiety   . Depression   . GERD (gastroesophageal reflux disease)   . 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 IBS (irritable bowel syndrome)   . MGUS (monoclonal gammopathy of unknown significance)   . Toxic shock syndrome ([place])    at age 78    Past Surgical History:  Procedure Laterality Date  . BUNIONECTOMY Bilateral   . CHOLECYSTECTOMY  2001  . NASAL SEPTUM SURGERY    . TONSILLECTOMY      MEDS:   Current Outpatient Medications on File Prior to Visit  Medication Sig Dispense Refill  . augmented betamethasone dipropionate (DIPROLENE-AF) 0.05 % ointment Apply topically 2 (two) times daily.    . betamethasone dipropionate 0.05 % cream Apply topically 2 (two) times daily.    Marland Kitchen buPROPion (WELLBUTRIN SR) 150 MG 12 hr tablet Take 150 mg by mouth 2 (two) times daily.    . clotrimazole (GYNE-LOTRIMIN 3) 2 % vaginal cream Place 1 Applicatorful vaginally at bedtime.    Marland Kitchen estradiol (ESTRACE) 0.1 MG/GM vaginal cream APPLY ONE THIN LAYER TO AFFECTED AREA NIGHTLY AT BEDTIME FOR 2 WEEKS (Patient not taking: Reported on 07/08/2019)    . gabapentin (NEURONTIN) 100 MG capsule Take 1 capsule (100 mg total) by mouth 3 (three) times daily. You can increase dosage to 200mg  TID. 60 capsule 0  . lidocaine (XYLOCAINE) 5 % ointment Apply 1 application topically 4 (four) times daily as needed. 1.25 g 0  . MOMETASONE FUROATE EX Apply 0.1 % topically. (Patient not  taking: Reported on 07/08/2019)    . mupirocin ointment (BACTROBAN) 2 % Place 1 application into the nose 2 (two) times daily. (Patient not taking: Reported on 07/08/2019)    . NON FORMULARY Hydrocortisone Balerate USP, 2% Ointment for lips    . nystatin-triamcinolone ointment (MYCOLOG) Apply 1 application topically 2 (two) times daily.    Marland Kitchen omeprazole (PRILOSEC) 20 MG capsule Take 20 mg by mouth daily.    . pimecrolimus (ELIDEL) 1 % cream Apply 1 application topically 2 (two) times daily. (Patient not taking: Reported on 07/08/2019)    . rizatriptan (MAXALT) 10 MG tablet Take 1 tablet (10 mg total) by mouth as needed for migraine. May repeat in 2 hours if needed 10 tablet 3  . topiramate (TOPAMAX) 25 MG tablet TAKE 3 TABLETS (75 MG TOTAL) BY MOUTH AT BEDTIME. 270 tablet 1  . triamcinolone ointment (KENALOG) 0.1 % Apply 1 application topically 2 (two) times daily.    Marland Kitchen Ubrogepant (UBRELVY) 100 MG TABS Take 1 tablet by mouth as needed (May repeat in 2 hours if needed.  Maximum 2 tablets in 24 hours.). 10 tablet 3  . valACYclovir (VALTREX) 1000 MG tablet Take 1 tablet (1,000 mg total) by mouth 2 (two) times daily. Take for 10 days 20 tablet 0  . vitamin B-12 (CYANOCOBALAMIN) 1000 MCG tablet Take 1,000 mcg by mouth daily.     No current facility-administered medications on file prior to visit.    ALLERGIES:  Levaquin [levofloxacin] and Methylchloroisothiazolinone [methylisothiazolinone]  Family History  Problem Relation Age of Onset  . Diabetes Mother   . COPD Mother   . Congestive Heart Failure Mother   . Thyroid disease Mother   . Hemochromatosis Father   . Alcoholism Father   . Diabetes Sister   . Asthma Sister   . Asthma Brother   . Diabetes Maternal Aunt   . Breast cancer Maternal Aunt   . Diabetes Other     SH:  ***  Review of Systems  PHYSICAL EXAMINATION:    LMP  (LMP Unknown) Comment: patient had spotting in October 2020    General appearance: alert, cooperative and appears  stated age Neck: no adenopathy, supple, symmetrical, trachea midline and thyroid {CHL AMB PHY EX THYROID NORM DEFAULT:[phone]::"normal to inspection and palpation"} CV:  {Exam; heart brief:31539} Lungs:  {pe lungs ob:314451::"clear to auscultation, no wheezes, rales or rhonchi, symmetric air entry"} Breasts: {Exam; breast:13139::"normal appearance, no masses or tenderness"} Abdomen: soft, non-tender; bowel sounds normal; no masses,  no organomegaly Lymph:  no inguinal LAD noted  Pelvic: External genitalia:  no lesions              Urethra:  normal appearing urethra with no masses, tenderness or lesions              Bartholins and Skenes: normal                 Vagina: normal appearing vagina with normal color and discharge, no lesions              Cervix: {CHL AMB PHY EX CERVIX NORM DEFAULT:[phone]::"no lesions"}              Bimanual Exam:  Uterus:  {CHL AMB PHY EX UTERUS NORM DEFAULT:[id number]::"normal size, contour, position, consistency, mobility, non-tender"}              Adnexa: {CHL AMB PHY EX ADNEXA NO MASS [phone]::"no mass, fullness, tenderness"}              Rectovaginal: {yes no:314532}.  Confirms.              Anus:  normal sphincter tone, no lesions  Chaperone, ***Terence Lux, CMA, was present for exam.  Assessment: ***  Plan: ***   ~{NUMBERS; -10-45 JOINT ROM:10287} minutes spent with patient >50% of time was in face to face discussion of above.

## 2019-07-25 ENCOUNTER — Ambulatory Visit: Payer: BC Managed Care – PPO | Admitting: Obstetrics and Gynecology

## 2019-07-25 ENCOUNTER — Encounter: Payer: Self-pay | Admitting: Obstetrics and Gynecology

## 2019-07-25 ENCOUNTER — Telehealth: Payer: Self-pay | Admitting: Obstetrics & Gynecology

## 2019-07-25 ENCOUNTER — Other Ambulatory Visit: Payer: Self-pay

## 2019-07-25 VITALS — BP 114/70 | HR 70 | Resp 16 | Wt 200.0 lb

## 2019-07-25 DIAGNOSIS — B009 Herpesviral infection, unspecified: Secondary | ICD-10-CM

## 2019-07-25 DIAGNOSIS — N760 Acute vaginitis: Secondary | ICD-10-CM | POA: Diagnosis not present

## 2019-07-25 MED ORDER — NYSTATIN-TRIAMCINOLONE 100000-0.1 UNIT/GM-% EX CREA: 1.0000 "application " | TOPICAL_CREAM | Freq: Two times a day (BID) | CUTANEOUS | 0 refills | Status: DC

## 2019-07-25 MED ORDER — VALACYCLOVIR HCL 500 MG PO TABS: 500.0000 mg | ORAL_TABLET | Freq: Every day | ORAL | 1 refills | Status: DC

## 2019-07-25 MED ORDER — FLUCONAZOLE 150 MG PO TABS: 150.0000 mg | ORAL_TABLET | Freq: Once | ORAL | 0 refills | Status: AC

## 2019-07-25 NOTE — Progress Notes (Signed)
GYNECOLOGY  VISIT   HPI: 54 y.o.   Married  Caucasian  female   [phone] with No LMP recorded (lmp unknown). Patient is postmenopausal.   here for vulvar rash.  Seen by Dr. Sabra Heck on 07/08/19 for vulvar rash thought to be HSV outbreak. Her HSV DNA testing was positive for HSV 1 and negative for HSV 2.  She also had a wound culture positive for E Coli and GBS.  She was treated with Amoxicillin and then Augmentin, which is now completed. Treated with Lidocaine and gabapentin for the pain.   Patient is having some stinging and itching at the top of the left labia. Some itching and burning with urination.  No dysuria. She took one Diflucan with the Amoxicillin.   She wants prophylaxis for HSV for at least 6 months.   GYNECOLOGIC HISTORY: No LMP recorded (lmp unknown). Patient is postmenopausal. Contraception: PMP Menopausal hormone therapy:  Estrace cream Last mammogram: 04-16-19 Neg/density B/BiRads1 Last pap smear: 12-04-18 Neg:Neg HR HPV, 05-04-16 Neg        OB History    Gravida  3   Para  2   Term  2   Preterm      AB  1   Living  2     SAB  1   TAB      Ectopic      Multiple      Live Births  2              Patient Active Problem List   Diagnosis Date Noted  . MGUS (monoclonal gammopathy of unknown significance)   . 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 Anxiety   . Depression   . GERD (gastroesophageal reflux disease)   . 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 HSV-1 infection   . IBS (irritable bowel syndrome)   . MGUS (monoclonal gammopathy of unknown significance)   . Toxic shock syndrome ([place])    at age 36    Past Surgical History:  Procedure Laterality Date  . BUNIONECTOMY Bilateral   . CHOLECYSTECTOMY  2001  . NASAL SEPTUM SURGERY    . TONSILLECTOMY      Current Outpatient Medications  Medication  Sig Dispense Refill  . augmented betamethasone dipropionate (DIPROLENE-AF) 0.05 % ointment Apply topically 2 (two) times daily.    . betamethasone dipropionate 0.05 % cream Apply topically 2 (two) times daily.    Marland Kitchen buPROPion (WELLBUTRIN SR) 150 MG 12 hr tablet Take 150 mg by mouth 2 (two) times daily.    Marland Kitchen gabapentin (NEURONTIN) 100 MG capsule Take 1 capsule (100 mg total) by mouth 3 (three) times daily. You can increase dosage to 200mg  TID. 60 capsule 0  . lidocaine (XYLOCAINE) 5 % ointment Apply 1 application topically 4 (four) times daily as needed. 1.25 g 0  . omeprazole (PRILOSEC) 20 MG capsule Take 20 mg by mouth daily.    Marland Kitchen topiramate (TOPAMAX) 25 MG tablet TAKE 3 TABLETS (75 MG TOTAL) BY MOUTH AT BEDTIME. 270 tablet 1  . Ubrogepant (UBRELVY) 100 MG TABS Take 1 tablet by mouth as needed (May repeat in 2 hours if needed.  Maximum 2 tablets in 24 hours.). 10 tablet 3  . vitamin B-12 (CYANOCOBALAMIN) 1000 MCG tablet Take 1,000 mcg by mouth daily.    Marland Kitchen estradiol (ESTRACE) 0.1 MG/GM vaginal cream APPLY ONE THIN LAYER  TO AFFECTED AREA NIGHTLY AT BEDTIME FOR 2 WEEKS (Patient not taking: Reported on 07/08/2019)    . nystatin-triamcinolone (MYCOLOG II) cream Apply 1 application topically 2 (two) times daily. Apply to affected area BID for up to 7 days. 60 g 0  . valACYclovir (VALTREX) 500 MG tablet Take 1 tablet (500 mg total) by mouth daily. 90 tablet 1   No current facility-administered medications for this visit.     ALLERGIES: Levaquin [levofloxacin] and Methylchloroisothiazolinone [methylisothiazolinone]  Family History  Problem Relation Age of Onset  . Diabetes Mother   . COPD Mother   . Congestive Heart Failure Mother   . Thyroid disease Mother   . Hemochromatosis Father   . Alcoholism Father   . Diabetes Sister   . Asthma Sister   . Asthma Brother   . Diabetes Maternal Aunt   . Breast cancer Maternal Aunt   . Diabetes Other     Social History   Socioeconomic History  .  Marital status: Married    Spouse name: Not on file  . Number of children: Not on file  . Years of education: Not on file  . Highest education level: Not on file  Occupational History  . Not on file  Tobacco Use  . Smoking status: Never Smoker  . Smokeless tobacco: Never Used  Vaping Use  . Vaping Use: Never used  Substance and Sexual Activity  . Alcohol use: Not Currently  . Drug use: Never  . Sexual activity: Not Currently    Birth control/protection: None  Other Topics Concern  . Not on file  Social History Narrative   Right handed    Two story home   Drinks caffeine   Social Determinants of Health   Financial Resource Strain:   . Difficulty of Paying Living Expenses:   Food Insecurity:   . Worried About Charity fundraiser in the Last Year:   . Arboriculturist in the Last Year:   Transportation Needs:   . Film/video editor (Medical):   Marland Kitchen Lack of Transportation (Non-Medical):   Physical Activity:   . Days of Exercise per Week:   . Minutes of Exercise per Session:   Stress:   . Feeling of Stress :   Social Connections:   . Frequency of Communication with Friends and Family:   . Frequency of Social Gatherings with Friends and Family:   . Attends Religious Services:   . Active Member of Clubs or Organizations:   . Attends Archivist Meetings:   Marland Kitchen Marital Status:   Intimate Partner Violence:   . Fear of Current or Ex-Partner:   . Emotionally Abused:   Marland Kitchen Physically Abused:   . Sexually Abused:     Review of Systems  Constitutional: Negative.   HENT: Negative.   Eyes: Negative.   Respiratory: Negative.   Cardiovascular: Negative.   Gastrointestinal: Negative.   Endocrine: Negative.   Genitourinary:       External vaginal itching & burning  Musculoskeletal: Negative.   Skin: Negative.   Allergic/Immunologic: Negative.   Neurological: Negative.   Hematological: Negative.   Psychiatric/Behavioral: Negative.     PHYSICAL EXAMINATION:     BP 114/70   Pulse 70   Resp 16   Wt 200 lb (90.7 kg)   LMP  (LMP Unknown) Comment: patient had spotting in October 2020  BMI 29.53 kg/m     General appearance: alert, cooperative and appears stated age   Pelvic: External genitalia:  Erythema of the vulva.  Splits in the skin on left labia majora.               Urethra:  normal appearing urethra with no masses, tenderness or lesions              Bartholins and Skenes: normal                 Vagina: normal appearing vagina with normal color and discharge, no lesions              Cervix: no lesions                Bimanual Exam:  Uterus:  normal size, contour, position, consistency, mobility, non-tender              Adnexa: no mass, fullness, tenderness             Chaperone was present for exam.  ASSESSMENT  Vulvovaginitis.  Looks classic for vulvar Candida. HSV 1.    PLAN  We discussed abx as a risk factor for yeast infection.  Affirm.  Rx for Diflucan course and Mycolog II.  Valtrex 500 mg daily.  We reviewed transmission of HSV.  Her partner will have testing done also.  Fu prn.   __20____ minutes consultation.

## 2019-07-25 NOTE — Telephone Encounter (Signed)
Spoke with pt. Pt states having worsening sx of vulvar rash. Pt was last seen with Dr Sabra Heck on 07/08/19 and was given lidocaine, valtrex due to thinking it was HSV outbreak. Pt states still having slight burning sensation with no visible bumps or lesions. Finished Valtrex 10 day course on 07/17/19. HSV 1 + on 07/08/19 +GBS and E-Coli on 07/08/19 from wound culture, was treated with Augmentin Rx.  Pt was advised to have OV from phone encounter dated 07/22/19, but declined at this time. Pt requesting to see any provider today due to worsening sx. Pt  scheduled as work-in OV with Dr Quincy Simmonds on 7/1 at 1115 am, ok per [place]. Pt agreeable and verbalized understanding of date and time of appt. CPS neg.   Routing to Dr Quincy Simmonds.  Encounter closed.

## 2019-07-25 NOTE — Telephone Encounter (Signed)
Patient has an appointment Tuesday 07/30/19 for a vulvar rash with Dr.Miller. She said "I do not think I can wait until next week and would like to see any provider available today".

## 2019-07-26 LAB — VAGINITIS/VAGINOSIS, DNA PROBE
Candida Species: NEGATIVE
Gardnerella vaginalis: NEGATIVE
Trichomonas vaginosis: NEGATIVE

## 2019-07-28 ENCOUNTER — Encounter: Payer: Self-pay | Admitting: Obstetrics and Gynecology

## 2019-07-30 ENCOUNTER — Ambulatory Visit: Payer: Self-pay | Admitting: Obstetrics & Gynecology

## 2019-08-19 ENCOUNTER — Encounter: Payer: Self-pay | Admitting: Neurology

## 2019-08-19 NOTE — Progress Notes (Addendum)
Gwendolyn Ramos (Key: [id number]) Rx #: [id number] [name] 100MG  tablets   Form [employer] [place] [employer] Electronic Request Form (CB) Created 22 hours ago Sent to Plan 21 hours ago Plan Response 21 hours ago Submit Clinical Questions 21 hours ago Determination Favorable 1 minute ago Message from Plan Effective from 08/19/2019 through 08/17/2020.

## 2019-09-09 ENCOUNTER — Inpatient Hospital Stay: Payer: BC Managed Care – PPO | Attending: Oncology

## 2019-09-09 ENCOUNTER — Other Ambulatory Visit: Payer: Self-pay

## 2019-09-09 DIAGNOSIS — Z79899 Other long term (current) drug therapy: Secondary | ICD-10-CM | POA: Diagnosis not present

## 2019-09-09 DIAGNOSIS — Z832 Family history of diseases of the blood and blood-forming organs and certain disorders involving the immune mechanism: Secondary | ICD-10-CM | POA: Insufficient documentation

## 2019-09-09 DIAGNOSIS — D472 Monoclonal gammopathy: Secondary | ICD-10-CM | POA: Diagnosis not present

## 2019-09-09 DIAGNOSIS — Z836 Family history of other diseases of the respiratory system: Secondary | ICD-10-CM | POA: Diagnosis not present

## 2019-09-09 DIAGNOSIS — Z833 Family history of diabetes mellitus: Secondary | ICD-10-CM | POA: Diagnosis not present

## 2019-09-09 DIAGNOSIS — Z6372 Alcoholism and drug addiction in family: Secondary | ICD-10-CM | POA: Insufficient documentation

## 2019-09-09 DIAGNOSIS — Z8349 Family history of other endocrine, nutritional and metabolic diseases: Secondary | ICD-10-CM | POA: Diagnosis not present

## 2019-09-09 DIAGNOSIS — Z803 Family history of malignant neoplasm of breast: Secondary | ICD-10-CM | POA: Diagnosis not present

## 2019-09-09 DIAGNOSIS — Z881 Allergy status to other antibiotic agents status: Secondary | ICD-10-CM | POA: Insufficient documentation

## 2019-09-09 DIAGNOSIS — Z9049 Acquired absence of other specified parts of digestive tract: Secondary | ICD-10-CM | POA: Diagnosis not present

## 2019-09-09 DIAGNOSIS — Z8249 Family history of ischemic heart disease and other diseases of the circulatory system: Secondary | ICD-10-CM | POA: Diagnosis not present

## 2019-09-09 LAB — CBC WITH DIFFERENTIAL/PLATELET
Abs Immature Granulocytes: 0.02 10*3/uL (ref 0.00–0.07)
Basophils Absolute: 0.1 10*3/uL (ref 0.0–0.1)
Basophils Relative: 1 %
Eosinophils Absolute: 0.2 10*3/uL (ref 0.0–0.5)
Eosinophils Relative: 2 %
HCT: 41.8 % (ref 36.0–46.0)
Hemoglobin: 14 g/dL (ref 12.0–15.0)
Immature Granulocytes: 0 %
Lymphocytes Relative: 32 %
Lymphs Abs: 2.8 10*3/uL (ref 0.7–4.0)
MCH: 31.4 pg (ref 26.0–34.0)
MCHC: 33.5 g/dL (ref 30.0–36.0)
MCV: 93.7 fL (ref 80.0–100.0)
Monocytes Absolute: 1 10*3/uL (ref 0.1–1.0)
Monocytes Relative: 12 %
Neutro Abs: 4.5 10*3/uL (ref 1.7–7.7)
Neutrophils Relative %: 53 %
Platelets: 282 10*3/uL (ref 150–400)
RBC: 4.46 MIL/uL (ref 3.87–5.11)
RDW: 14.1 % (ref 11.5–15.5)
WBC: 8.7 10*3/uL (ref 4.0–10.5)
nRBC: 0 % (ref 0.0–0.2)

## 2019-09-09 LAB — COMPREHENSIVE METABOLIC PANEL
ALT: 26 U/L (ref 0–44)
AST: 25 U/L (ref 15–41)
Albumin: 3.9 g/dL (ref 3.5–5.0)
Alkaline Phosphatase: 63 U/L (ref 38–126)
Anion gap: 9 (ref 5–15)
BUN: 23 mg/dL — ABNORMAL HIGH (ref 6–20)
CO2: 21 mmol/L — ABNORMAL LOW (ref 22–32)
Calcium: 10.2 mg/dL (ref 8.9–10.3)
Chloride: 107 mmol/L (ref 98–111)
Creatinine, Ser: 0.96 mg/dL (ref 0.44–1.00)
GFR calc Af Amer: >60 mL/min (ref >60)
GFR calc non Af Amer: >60 mL/min (ref >60)
Glucose, Bld: 94 mg/dL (ref 70–99)
Potassium: 4.1 mmol/L (ref 3.5–5.1)
Sodium: 137 mmol/L (ref 135–145)
Total Bilirubin: 0.4 mg/dL (ref 0.3–1.2)
Total Protein: 8.5 g/dL — ABNORMAL HIGH (ref 6.5–8.1)

## 2019-09-09 LAB — FERRITIN: Ferritin: 90 ng/mL (ref 11–307)

## 2019-09-09 LAB — IRON AND TIBC
Iron: 68 ug/dL (ref 41–142)
Saturation Ratios: 26 % (ref 21–57)
TIBC: 260 ug/dL (ref 236–444)
UIBC: 192 ug/dL (ref 120–384)

## 2019-09-10 LAB — KAPPA/LAMBDA LIGHT CHAINS
Kappa free light chain: 57.4 mg/L — ABNORMAL HIGH (ref 3.3–19.4)
Kappa, lambda light chain ratio: 7.65 — ABNORMAL HIGH (ref 0.26–1.65)
Lambda free light chains: 7.5 mg/L (ref 5.7–26.3)

## 2019-09-12 LAB — MULTIPLE MYELOMA PANEL, SERUM
Albumin SerPl Elph-Mcnc: 4.1 g/dL (ref 2.9–4.4)
Albumin/Glob SerPl: 1.1 (ref 0.7–1.7)
Alpha 1: 0.2 g/dL (ref 0.0–0.4)
Alpha2 Glob SerPl Elph-Mcnc: 0.7 g/dL (ref 0.4–1.0)
B-Globulin SerPl Elph-Mcnc: 1 g/dL (ref 0.7–1.3)
Gamma Glob SerPl Elph-Mcnc: 2.2 g/dL — ABNORMAL HIGH (ref 0.4–1.8)
Globulin, Total: 4.1 g/dL — ABNORMAL HIGH (ref 2.2–3.9)
IgA: 61 mg/dL — ABNORMAL LOW (ref 87–352)
IgG (Immunoglobin G), Serum: 2427 mg/dL — ABNORMAL HIGH (ref 586–1602)
IgM (Immunoglobulin M), Srm: 70 mg/dL (ref 26–217)
M Protein SerPl Elph-Mcnc: 1.7 g/dL — ABNORMAL HIGH
Total Protein ELP: 8.2 g/dL (ref 6.0–8.5)

## 2019-09-15 NOTE — Progress Notes (Signed)
[place]  [phone] [phone]     ID: Gwendolyn Ramos DOB: [birth year]  MR#: [id number]  JYN#:[id number]  Patient Care Team: [name], MD as PCP - General (Family Medicine) Helyne Genther, Virgie Dad, MD as Consulting Physician (Oncology) Megan Salon, MD as Consulting Physician (Gynecology) Pieter Partridge, DO as Consulting Physician (Neurology) Chauncey Cruel, MD OTHER MD:  CHIEF COMPLAINT: MGUS; hereditary hemochromatosis  CURRENT TREATMENT: Observation   INTERVAL HISTORY: [name] returns today for follow up of her MGUS and hereditary hemochromatosis. She was evaluated in the hematology clinic on 01/30/2019.   We are checking lab work every 6 months, doing visits once a year.  We are following both her ferritin and her M protein and kappa lambda light chains ratio. Results for CONCEPCION, GILLOTT (MRN [id number]) as of 09/16/2019 15:43  Ref. Range 03/11/2019 12:16 09/09/2019 14:34  M Protein SerPl Elph-Mcnc Latest Ref Range: Not Observed g/dL 1.5 (H) 1.7 (H)  Results for BRAELEIGH, PYPER (MRN [id number]) as of 09/16/2019 15:43  Ref. Range 03/11/2019 12:16 09/09/2019 14:34  Kappa, lamda light chain ratio Latest Ref Range: 0.26 - 1.65  4.85 (H) 7.65 (H)  Results for BEANCA, KIESTER (MRN [id number]) as of 09/16/2019 15:43  Ref. Range 03/11/2019 12:16 09/09/2019 14:34  Ferritin Latest Ref Range: 11 - 307 ng/mL 75 90  Results for RENELLE, STEGENGA (MRN [id number]) as of 09/16/2019 15:43  Ref. Range 03/11/2019 12:16 09/09/2019 14:34  Saturation Ratios Latest Ref Range: 21 - 57 % 27 26    REVIEW OF SYSTEMS: Breiana is following the up TIVA diet and has managed to lose about 20 pounds.  She is very involved with her 2 daughters who are 45 and 69 and we discussed some of the issues involved.  In general however they are doing well.  A detailed review of systems today was otherwise noncontributory  HISTORY OF CURRENT  ILLNESS: From the original intake note:  Gwendolyn Ramos has a history of monoclonal gammopathy and hereditary hemochromatosis. She has been followed by Dr. Wilnette Kales since 2016 while living in [place].   She was tested for hemochromatosis because her father had that problem.  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  Dr. Bridgett Larsson has been following her every 6 months.  His most recent ferritin on June 26, 2018 was 13 and her transferrin saturation was 55%.  The patient also has an IgG kappa MGUS.  This is also being followed every 6 months.  Her most recent IgG level was 2577 with the kappa light chains at 52.4, both of those on June 26, 2018  The patient is here today to establish herself in our care   PAST MEDICAL HISTORY: Past Medical History:  Diagnosis Date  . Anxiety   . Depression   . GERD (gastroesophageal reflux disease)   . 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 HSV-1 infection   . IBS (irritable bowel syndrome)   . MGUS (monoclonal gammopathy of unknown significance)   . Toxic shock syndrome ([place])    at age 42    PAST SURGICAL HISTORY: Past Surgical History:  Procedure Laterality Date  . BUNIONECTOMY Bilateral   . CHOLECYSTECTOMY  2001  . NASAL SEPTUM SURGERY    . TONSILLECTOMY      FAMILY HISTORY: Family History  Problem Relation Age of Onset  .  Diabetes Mother   . COPD Mother   . Congestive Heart Failure Mother   . Thyroid disease Mother   . Hemochromatosis Father   . Alcoholism Father   . Diabetes Sister   . Asthma Sister   . Asthma Brother   . Diabetes Maternal Aunt   . Breast cancer Maternal Aunt   . Diabetes Other    Patient's father was 55 years old when he died from alcoholic cirrhosis in the setting of hemochromatosis. Patient's mother is 69 years old as of January 2021.  The patient has 3 siblings, 2 full  brothers and 1 half sister.  The patient's brothers have not been tested for hemochromatosis.  The patient's sister is by different father   GYNECOLOGIC HISTORY:  No LMP recorded (lmp unknown). Patient is postmenopausal. Menarche: 54 years old Age at first live birth: 54 years old Stanley P 2 HRT no Hysterectomy? no BSO? 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  Her husband [name] runs [employer] and does a great deal of international traveling.  Their 2 daughters are [name], 20, studying business at [employer], and [name], [place], [name] psychology at [employer].    ADVANCED DIRECTIVES: In the absence of any documents to the contrary the patient's husband is her healthcare power of attorney   HEALTH MAINTENANCE: Social History   Tobacco Use  . Smoking status: Never Smoker  . Smokeless tobacco: Never Used  Vaping Use  . Vaping Use: Never used  Substance Use Topics  . Alcohol use: Not Currently  . Drug use: Never     Colonoscopy: 2017  PAP: Up-to-date  Bone density:    Allergies  Allergen Reactions  . Levaquin [Levofloxacin]     High fever, elevated heart rate  . Methylchloroisothiazolinone [Methylisothiazolinone]     Current Outpatient Medications  Medication Sig Dispense Refill  . augmented betamethasone dipropionate (DIPROLENE-AF) 0.05 % ointment Apply topically 2 (two) times daily.    . betamethasone dipropionate 0.05 % cream Apply topically 2 (two) times daily.    Marland Kitchen buPROPion (WELLBUTRIN SR) 150 MG 12 hr tablet Take 150 mg by mouth 2 (two) times daily.    Marland Kitchen estradiol (ESTRACE) 0.1 MG/GM vaginal cream APPLY ONE THIN LAYER TO AFFECTED AREA NIGHTLY AT BEDTIME FOR 2 WEEKS (Patient not taking: Reported on 07/08/2019)    . gabapentin (NEURONTIN) 100 MG capsule Take 1 capsule (100 mg total) by mouth 3 (three) times daily. You can increase dosage to 200mg  TID. 60 capsule 0  . lidocaine (XYLOCAINE) 5 % ointment Apply 1 application  topically 4 (four) times daily as needed. 1.25 g 0  . nystatin-triamcinolone (MYCOLOG II) cream Apply 1 application topically 2 (two) times daily. Apply to affected area BID for up to 7 days. 60 g 0  . omeprazole (PRILOSEC) 20 MG capsule Take 20 mg by mouth daily.    Marland Kitchen topiramate (TOPAMAX) 25 MG tablet TAKE 3 TABLETS (75 MG TOTAL) BY MOUTH AT BEDTIME. 270 tablet 1  . Ubrogepant (UBRELVY) 100 MG TABS Take 1 tablet by mouth as needed (May repeat in 2 hours if needed.  Maximum 2 tablets in 24 hours.). 10 tablet 3  . valACYclovir (VALTREX) 500 MG tablet Take 1 tablet (500 mg total) by mouth daily. 90 tablet 1  . vitamin B-12 (CYANOCOBALAMIN) 1000 MCG tablet Take 1,000 mcg by mouth daily.     No current facility-administered medications for this visit.    OBJECTIVE: White woman who appears  well  Vitals:   09/16/19 1530  BP: 123/75  Pulse: 70  Resp: 20  Temp: (!) 96.5 F (35.8 C)  SpO2: 99%     Body mass index is 29.02 kg/m.   Wt Readings from Last 3 Encounters:  09/16/19 196 lb 8 oz (89.1 kg)  07/25/19 200 lb (90.7 kg)  07/08/19 211 lb (95.7 kg)      ECOG FS:1 - Symptomatic but completely ambulatory  Sclerae unicteric, EOMs intact Wearing a mask No cervical or supraclavicular adenopathy Lungs no rales or rhonchi Heart regular rate and rhythm Abd soft, nontender, positive bowel sounds MSK no focal spinal tenderness, no upper extremity lymphedema Neuro: nonfocal, well oriented, appropriate affect Breasts: Deferred   LAB RESULTS:  CMP     Component Value Date/Time   NA 137 09/09/2019 1434   K 4.1 09/09/2019 1434   CL 107 09/09/2019 1434   CO2 21 (L) 09/09/2019 1434   GLUCOSE 94 09/09/2019 1434   BUN 23 (H) 09/09/2019 1434   CREATININE 0.96 09/09/2019 1434   CALCIUM 10.2 09/09/2019 1434   PROT 8.5 (H) 09/09/2019 1434   ALBUMIN 3.9 09/09/2019 1434   AST 25 09/09/2019 1434   ALT 26 09/09/2019 1434   ALKPHOS 63 09/09/2019 1434   BILITOT 0.4 09/09/2019 1434   GFRNONAA  >60 09/09/2019 1434   GFRAA >60 09/09/2019 1434    Lab Results  Component Value Date   TOTALPROTELP 8.2 09/09/2019    Lab Results  Component Value Date   WBC 8.7 09/09/2019   NEUTROABS 4.5 09/09/2019   HGB 14.0 09/09/2019   HCT 41.8 09/09/2019   MCV 93.7 09/09/2019   PLT 282 09/09/2019    No results found for: LABCA2  No components found for: NATFTD322  No results for input(s): INR in the last 168 hours.  No results found for: LABCA2  No results found for: GUR427  No results found for: CWC376  No results found for: EGB151  No results found for: CA2729  No components found for: HGQUANT  No results found for: CEA1 / No results found for: CEA1   No results found for: AFPTUMOR  No results found for: [facility]  Lab Results  Component Value Date   KPAFRELGTCHN 57.4 (H) 09/09/2019   LAMBDASER 7.5 09/09/2019   KAPLAMBRATIO 7.65 (H) 09/09/2019   (kappa/lambda light chains)  No results found for: HGBA, HGBA2QUANT, HGBFQUANT, HGBSQUAN (Hemoglobinopathy evaluation)   No results found for: LDH  Lab Results  Component Value Date   IRON 68 09/09/2019   TIBC 260 09/09/2019   IRONPCTSAT 26 09/09/2019   (Iron and TIBC)  Lab Results  Component Value Date   FERRITIN 90 09/09/2019    Urinalysis No results found for: COLORURINE, APPEARANCEUR, LABSPEC, PHURINE, GLUCOSEU, HGBUR, BILIRUBINUR, KETONESUR, PROTEINUR, UROBILINOGEN, NITRITE, LEUKOCYTESUR   STUDIES: No results found.   ELIGIBLE FOR AVAILABLE RESEARCH PROTOCOL: no  ASSESSMENT: 54 y.o. [place] woman with the following concerns:  (1) IgG kappa MGUS  (a) plan is to check SPEP/Lambda light chains every 6 months  (2) hemochromatosis: Compound heterozygote: C282Y and H63D positive  (a) plan is to check ferritin and hemoglobin saturation every 6 months  (b) consider phlebotomy once ferritin greater than 150 or saturation greater than 80%   PLAN: Ariza is doing well overall and we discussed  her numbers in detail.  She understands there has been a very minimal progression and if the rate of increase in her M protein's and kappa lambda ratio continues at the  same rate it will be about 2-5 years before she will cross a 3.0 g barrier and become officially "smoldering" myeloma.  At that point we could consider some type of therapy although even then it would not be mandatory.  We are also following her iron.  This is going to vary of course with diet but right now there is no indication for any intervention  We are going to continue as before.  She will have lab work in 6 and 12 months and she will return to see me a little bit after the 27-month lab work.  There is any unusual finding in the labs in the 77-month visit then she will call or I will call her  Total encounter time 25 minutes.Chauncey Cruel, MD   09/16/2019 3:44 PM [facility] [place],  [address] Tel. [phone]    Fax. [phone]   This document serves as a record of services personally performed by Lurline Del, MD. It was created on his behalf by Wilburn Mylar, a trained medical scribe. The creation of this record is based on the scribe's personal observations and the provider's statements to them.   I, Lurline Del MD, have reviewed the above documentation for accuracy and completeness, and I agree with the above.   *Total Encounter Time as defined by the Centers for Medicare and Medicaid Services includes, in addition to the face-to-face time of a patient visit (documented in the note above) non-face-to-face time: obtaining and reviewing outside history, ordering and reviewing medications, tests or procedures, care coordination (communications with other health care professionals or caregivers) and documentation in the medical record.

## 2019-09-16 ENCOUNTER — Inpatient Hospital Stay: Payer: BC Managed Care – PPO | Admitting: Oncology

## 2019-09-16 ENCOUNTER — Other Ambulatory Visit: Payer: Self-pay

## 2019-09-16 VITALS — BP 123/75 | HR 70 | Temp 96.5°F | Resp 20 | Ht 69.0 in | Wt 196.5 lb

## 2019-09-16 DIAGNOSIS — D472 Monoclonal gammopathy: Secondary | ICD-10-CM | POA: Diagnosis not present

## 2019-12-30 ENCOUNTER — Other Ambulatory Visit: Payer: Self-pay | Admitting: Neurology

## 2020-01-02 ENCOUNTER — Other Ambulatory Visit: Payer: Self-pay | Admitting: Neurology

## 2020-01-02 ENCOUNTER — Telehealth: Payer: Self-pay | Admitting: Neurology

## 2020-01-02 MED ORDER — TOPIRAMATE 25 MG PO TABS: 75.0000 mg | ORAL_TABLET | Freq: Every day | ORAL | 0 refills | Status: DC

## 2020-01-02 NOTE — Telephone Encounter (Signed)
topamax refill sent in

## 2020-01-02 NOTE — Telephone Encounter (Signed)
Patient called in stating a refill of her Topamax. She scheduled a follow up for 05/07/20. She would like a refill be sent in to [facility] on [address].

## 2020-01-04 ENCOUNTER — Other Ambulatory Visit: Payer: Self-pay | Admitting: Obstetrics and Gynecology

## 2020-01-06 NOTE — Telephone Encounter (Signed)
Medication refill request: Valtrex Last AEX:  12/04/18 Dr. Sabra Heck Last OV: 07/25/19 Next AEX: none Last MMG (if hormonal medication request): n/a Refill authorized: today, please advise

## 2020-01-13 ENCOUNTER — Other Ambulatory Visit: Payer: Self-pay | Admitting: Neurology

## 2020-02-27 ENCOUNTER — Ambulatory Visit: Payer: BC Managed Care – PPO

## 2020-03-11 ENCOUNTER — Other Ambulatory Visit (HOSPITAL_COMMUNITY)
Admission: RE | Admit: 2020-03-11 | Discharge: 2020-03-11 | Disposition: A | Discharge status: Home / Self Care | Payer: BC Managed Care – PPO | Source: Ambulatory Visit | Attending: Obstetrics & Gynecology | Admitting: Obstetrics & Gynecology

## 2020-03-11 ENCOUNTER — Ambulatory Visit (INDEPENDENT_AMBULATORY_CARE_PROVIDER_SITE_OTHER): Payer: BC Managed Care – PPO | Admitting: Obstetrics & Gynecology

## 2020-03-11 ENCOUNTER — Other Ambulatory Visit: Payer: Self-pay

## 2020-03-11 ENCOUNTER — Encounter (HOSPITAL_BASED_OUTPATIENT_CLINIC_OR_DEPARTMENT_OTHER): Payer: Self-pay | Admitting: Obstetrics & Gynecology

## 2020-03-11 VITALS — BP 124/72 | HR 72 | Ht 69.0 in | Wt 196.0 lb

## 2020-03-11 DIAGNOSIS — L900 Lichen sclerosus et atrophicus: Secondary | ICD-10-CM

## 2020-03-11 DIAGNOSIS — N816 Rectocele: Secondary | ICD-10-CM

## 2020-03-11 DIAGNOSIS — B009 Herpesviral infection, unspecified: Secondary | ICD-10-CM | POA: Diagnosis not present

## 2020-03-11 DIAGNOSIS — K219 Gastro-esophageal reflux disease without esophagitis: Secondary | ICD-10-CM

## 2020-03-11 DIAGNOSIS — D472 Monoclonal gammopathy: Secondary | ICD-10-CM

## 2020-03-11 DIAGNOSIS — Z01419 Encounter for gynecological examination (general) (routine) without abnormal findings: Secondary | ICD-10-CM | POA: Diagnosis not present

## 2020-03-11 DIAGNOSIS — N898 Other specified noninflammatory disorders of vagina: Secondary | ICD-10-CM

## 2020-03-11 DIAGNOSIS — N393 Stress incontinence (female) (male): Secondary | ICD-10-CM

## 2020-03-11 DIAGNOSIS — Z803 Family history of malignant neoplasm of breast: Secondary | ICD-10-CM

## 2020-03-11 HISTORY — DX: Gastro-esophageal reflux disease without esophagitis: K21.9

## 2020-03-11 MED ORDER — VALACYCLOVIR HCL 500 MG PO TABS: 500.0000 mg | ORAL_TABLET | Freq: Every day | ORAL | 4 refills | Status: DC

## 2020-03-11 NOTE — Progress Notes (Signed)
55 y.o. [id number] Married White or Caucasian female here for annual exam.    Denies vaginal bleeding.  Reports she's seen Dr. Particia Nearing at [facility].  Was diagnosed with vulvar lichen sclerosus. Has been on betamethasone with improved.  She will have follow in May.  Tacrolimus was also prescribed but has caused increased irritation.    Denies vaginal bleeding.  Does report increased whitish discharge over the past few days.  Reminds her of discharge when she would ovulated.  Has concerns about how patent her vagina is.  Feels this is interfering with intercourse.  Feels she can see bulge at the end of the day.  Wonders if reasonable to consider repair.  Does have incontinence.  Saw Ileana Roup, PT, at [facility].  This did help but still does leak on occasion with laugh, sneeze, lift, strain.    No LMP recorded (lmp unknown). Patient is postmenopausal.          Sexually active: Yes.    The current method of family planning is post menopausal status.    Exercising: Yes.    walk 3 miles three times weekly Smoker:  no  Health Maintenance: Pap:  11/2018, neg with neg HR HPV History of abnormal Pap:  no MMG:  04/16/2019 Colonoscopy:  03/13/15, In [place], is in EPIC BMD:   n/a TDaP:  12/27/2018 Pneumonia vaccine(s):  n/a Shingrix:   completed Hep C testing: will plan with blood work  Screening Labs: done with Dr. Laurann Montana   reports that she has never smoked. She has never used smokeless tobacco. She reports previous alcohol use. She reports that she does not use drugs.  Past Medical History:  Diagnosis Date  . Anxiety   . Depression   . GERD (gastroesophageal reflux disease)   . 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 HSV-1 infection   . IBS (irritable bowel syndrome)   . MGUS (monoclonal gammopathy of unknown significance)   . Toxic shock syndrome ([place])    at age 89    Past Surgical History:  Procedure Laterality Date  .  BUNIONECTOMY Bilateral   . CHOLECYSTECTOMY  2001  . NASAL SEPTUM SURGERY    . TONSILLECTOMY      Current Outpatient Medications  Medication Sig Dispense Refill  . augmented betamethasone dipropionate (DIPROLENE-AF) 0.05 % ointment Apply topically 2 (two) times daily.    . betamethasone dipropionate 0.05 % cream Apply topically 2 (two) times daily.    Marland Kitchen buPROPion (WELLBUTRIN SR) 150 MG 12 hr tablet Take 150 mg by mouth 2 (two) times daily.    Marland Kitchen lidocaine (XYLOCAINE) 5 % ointment Apply 1 application topically 4 (four) times daily as needed. 1.25 g 0  . nystatin-triamcinolone (MYCOLOG II) cream Apply 1 application topically 2 (two) times daily. Apply to affected area BID for up to 7 days. 60 g 0  . omeprazole (PRILOSEC) 20 MG capsule Take 20 mg by mouth daily.    Marland Kitchen topiramate (TOPAMAX) 25 MG tablet Take 3 tablets (75 mg total) by mouth at bedtime. 270 tablet 0  . UBRELVY 100 MG TABS TAKE 1 TABLET BY MOUTH AS NEEDED (MAY REPEAT IN 2 HOURS IF NEEDED. MAXIMUM 2 TABLETS IN 24 HOURS.). 10 tablet 3  . vitamin B-12 (CYANOCOBALAMIN) 1000 MCG tablet Take 1,000 mcg by mouth daily.    . tacrolimus (PROTOPIC) 0.1 % ointment Apply topically.    . valACYclovir (VALTREX) 500 MG tablet Take 1 tablet (500 mg total) by  mouth daily. 90 tablet 4   No current facility-administered medications for this visit.    Family History  Problem Relation Age of Onset  . Diabetes Mother   . COPD Mother   . Congestive Heart Failure Mother   . Thyroid disease Mother   . Hemochromatosis Father   . Alcoholism Father   . Diabetes Sister   . Asthma Sister   . Asthma Brother   . Diabetes Maternal Aunt   . Breast cancer Maternal Aunt   . Diabetes Other     Review of Systems  Genitourinary: Positive for vaginal discharge.  All other systems reviewed and are negative.   Exam:   BP 124/72   Pulse 72   Ht 5\' 9"  (1.753 m)   Wt 196 lb (88.9 kg)   LMP  (LMP Unknown) Comment: patient had spotting in October 2020  SpO2  99%   BMI 28.94 kg/m   Height: 5\' 9"  (175.3 cm)  General appearance: alert, cooperative and appears stated age Head: Normocephalic, without obvious abnormality, atraumatic Neck: no adenopathy, supple, symmetrical, trachea midline and thyroid normal to inspection and palpation Lungs: clear to auscultation bilaterally Breasts: normal appearance, no masses or tenderness Heart: regular rate and rhythm Abdomen: soft, non-tender; bowel sounds normal; no masses,  no organomegaly Extremities: extremities normal, atraumatic, no cyanosis or edema Skin: Skin color, texture, turgor normal. No rashes or lesions Lymph nodes: Cervical, supraclavicular, and axillary nodes normal. No abnormal inguinal nodes palpated Neurologic: Grossly normal   Pelvic: External genitalia:  no lesions but hypopigmentation noted near clitoral hood              Urethra:  normal appearing urethra with no masses, tenderness or lesions              Bartholins and Skenes: normal                 Vagina: normal appearing vagina with normal color and discharge, no lesions, third degree rectocele noted with Valsalva              Cervix: no lesions              Pap taken: No. Bimanual Exam:  Uterus:  normal size, contour, position, consistency, mobility, non-tender              Adnexa: normal adnexa and no mass, fullness, tenderness               Rectovaginal: Confirms               Anus:  normal sphincter tone, no lesions  Chaperone, Britt Bottom, CMA, was present for exam.  Assessment/Plan: 1. Well woman exam with routine gynecological exam - Pap 11/2018, neg with neg HR HPV.  Not indicated today. - MMG 04/16/2019 - Colonoscopy 03/13/15 - BMD not due - lab work done with Dr. [name], PCP -Vaccines up to date  2. Lichen sclerosus - has rx for betamethasone from Dr. Particia Nearing and follow up planned 05/2020  3. HSV (herpes simplex virus) infection - valACYclovir (VALTREX) 500 MG tablet; Take 1 tablet (500 mg total) by  mouth daily.  Dispense: 90 tablet; Refill: 4  4. Rectocele and Stress incontinence - Ambulatory referral to Urogynecology  5. Vaginal discharge - Cervicovaginal ancillary only( [place])  7. MGUS (monoclonal gammopathy of unknown significance) - followed by Dr. Jana Hakim  8. 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 Jana Hakim  9. Family  history of breast cancer (in maternal aunt) - Doing yearly MMG

## 2020-03-11 NOTE — Patient Instructions (Signed)
If has outbreak, take valtrex 500mg  twice daily x 3 days and then restart.

## 2020-03-12 LAB — CERVICOVAGINAL ANCILLARY ONLY
Bacterial Vaginitis (gardnerella): NEGATIVE
Candida Glabrata: NEGATIVE
Candida Vaginitis: NEGATIVE
Comment: NEGATIVE
Comment: NEGATIVE
Comment: NEGATIVE

## 2020-03-18 ENCOUNTER — Other Ambulatory Visit: Payer: Self-pay

## 2020-03-18 ENCOUNTER — Other Ambulatory Visit: Payer: Self-pay | Admitting: Family Medicine

## 2020-03-18 ENCOUNTER — Inpatient Hospital Stay: Payer: BC Managed Care – PPO | Attending: Oncology

## 2020-03-18 DIAGNOSIS — Z1231 Encounter for screening mammogram for malignant neoplasm of breast: Secondary | ICD-10-CM

## 2020-03-18 DIAGNOSIS — D472 Monoclonal gammopathy: Secondary | ICD-10-CM

## 2020-03-18 LAB — CBC WITH DIFFERENTIAL/PLATELET
Abs Immature Granulocytes: 0.02 10*3/uL (ref 0.00–0.07)
Basophils Absolute: 0.1 10*3/uL (ref 0.0–0.1)
Basophils Relative: 1 %
Eosinophils Absolute: 0.2 10*3/uL (ref 0.0–0.5)
Eosinophils Relative: 3 %
HCT: 42 % (ref 36.0–46.0)
Hemoglobin: 14.1 g/dL (ref 12.0–15.0)
Immature Granulocytes: 0 %
Lymphocytes Relative: 36 %
Lymphs Abs: 2.7 10*3/uL (ref 0.7–4.0)
MCH: 31.3 pg (ref 26.0–34.0)
MCHC: 33.6 g/dL (ref 30.0–36.0)
MCV: 93.3 fL (ref 80.0–100.0)
Monocytes Absolute: 0.8 10*3/uL (ref 0.1–1.0)
Monocytes Relative: 10 %
Neutro Abs: 3.6 10*3/uL (ref 1.7–7.7)
Neutrophils Relative %: 50 %
Platelets: 297 10*3/uL (ref 150–400)
RBC: 4.5 MIL/uL (ref 3.87–5.11)
RDW: 14.4 % (ref 11.5–15.5)
WBC: 7.3 10*3/uL (ref 4.0–10.5)
nRBC: 0 % (ref 0.0–0.2)

## 2020-03-18 LAB — COMPREHENSIVE METABOLIC PANEL
ALT: 29 U/L (ref 0–44)
AST: 21 U/L (ref 15–41)
Albumin: 4.2 g/dL (ref 3.5–5.0)
Alkaline Phosphatase: 55 U/L (ref 38–126)
Anion gap: 8 (ref 5–15)
BUN: 13 mg/dL (ref 6–20)
CO2: 20 mmol/L — ABNORMAL LOW (ref 22–32)
Calcium: 9.5 mg/dL (ref 8.9–10.3)
Chloride: 109 mmol/L (ref 98–111)
Creatinine, Ser: 0.96 mg/dL (ref 0.44–1.00)
GFR, Estimated: >60 mL/min (ref >60)
Glucose, Bld: 99 mg/dL (ref 70–99)
Potassium: 4.1 mmol/L (ref 3.5–5.1)
Sodium: 137 mmol/L (ref 135–145)
Total Bilirubin: 0.3 mg/dL (ref 0.3–1.2)
Total Protein: 8.6 g/dL — ABNORMAL HIGH (ref 6.5–8.1)

## 2020-03-18 LAB — IRON AND TIBC
Iron: 61 ug/dL (ref 41–142)
Saturation Ratios: 26 % (ref 21–57)
TIBC: 239 ug/dL (ref 236–444)
UIBC: 178 ug/dL (ref 120–384)

## 2020-03-18 LAB — FERRITIN: Ferritin: 76 ng/mL (ref 11–307)

## 2020-03-19 LAB — KAPPA/LAMBDA LIGHT CHAINS
Kappa free light chain: 39.3 mg/L — ABNORMAL HIGH (ref 3.3–19.4)
Kappa, lambda light chain ratio: 5.61 — ABNORMAL HIGH (ref 0.26–1.65)
Lambda free light chains: 7 mg/L (ref 5.7–26.3)

## 2020-03-20 LAB — MULTIPLE MYELOMA PANEL, SERUM
Albumin SerPl Elph-Mcnc: 4.1 g/dL (ref 2.9–4.4)
Albumin/Glob SerPl: 1.1 (ref 0.7–1.7)
Alpha 1: 0.2 g/dL (ref 0.0–0.4)
Alpha2 Glob SerPl Elph-Mcnc: 0.7 g/dL (ref 0.4–1.0)
B-Globulin SerPl Elph-Mcnc: 0.9 g/dL (ref 0.7–1.3)
Gamma Glob SerPl Elph-Mcnc: 2.3 g/dL — ABNORMAL HIGH (ref 0.4–1.8)
Globulin, Total: 4.1 g/dL — ABNORMAL HIGH (ref 2.2–3.9)
IgA: 56 mg/dL — ABNORMAL LOW (ref 87–352)
IgG (Immunoglobin G), Serum: 2629 mg/dL — ABNORMAL HIGH (ref 586–1602)
IgM (Immunoglobulin M), Srm: 73 mg/dL (ref 26–217)
M Protein SerPl Elph-Mcnc: 1.7 g/dL — ABNORMAL HIGH
Total Protein ELP: 8.2 g/dL (ref 6.0–8.5)

## 2020-03-30 ENCOUNTER — Other Ambulatory Visit: Payer: Self-pay | Admitting: Neurology

## 2020-04-16 NOTE — Progress Notes (Signed)
[facility] New Patient Evaluation and Consultation  Referring Provider: Megan Salon, MD PCP: [name], MD Date of Service: 04/21/2020  SUBJECTIVE Chief Complaint: New Patient (Initial Visit) (Dr Sabra Heck referral)  History of Present Illness: Gwendolyn Ramos is a 55 y.o. White or Caucasian female seen in consultation at the request of Dr. Sabra Heck for evaluation of prolapse and incontinence.    Review of records significant for: Pt complains of vaginal bulge and patent vagina. Can feel bulge later in day. Also having leakage with cough/ sneeze. Has a history of vulvar lichen sclerosus, followed by Dermatology.   Urinary Symptoms: Leaks urine with cough/ sneeze, laughing, exercise, lifting, during sex, with a full bladder and with movement to the bathroom. SUI > UUI. Urgency has not been significant lately, not often.  Leaks several time(s) per day.  Pad use: 0- 1 liners/ mini-pads per day.   She is bothered by her UI symptoms. Has done physical therapy about a year ago. Was not consistent about doing the exercises.   Day time voids 6-8.  Nocturia: 2-3 times per night to void. Voiding dysfunction: she does not empty her bladder well.  does not use a catheter to empty bladder.  When urinating, she feels a weak stream, dribbling after finishing and the need to urinate multiple times in a row Drinks: 2 cups coffee and water per day  UTIs: 0 UTI's in the last year.   Denies history of blood in urine and kidney or bladder stones  Pelvic Organ Prolapse Symptoms:                  She Admits to a feeling of a bulge the vaginal area.  She Admits to seeing a bulge.  This bulge is not bothersome.  Bowel Symptom: Bowel movements: 1-2 time(s) per day Stool consistency: hard or soft  Straining: yes.  Splinting: yes, sometimes Incomplete evacuation: yes.  She Admits to accidental bowel leakage / fecal incontinence  Occurs: if she is outside and can't get to a  bathroom  Consistency with leakage: soft  Bowel regimen: none Last colonoscopy: Date 2017, Results negative  Sexual Function Sexually active: yes.  Sexual orientation: heterosexual Pain with sex: Yes rarely, occurs deep in the pelvis, has discomfort due to prolapse, has tearing at perineum  Pelvic Pain Denies pelvic pain  Past Medical History:  Past Medical History:  Diagnosis Date  . Anxiety   . Depression   . GERD (gastroesophageal reflux disease)   . 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 HSV-1 infection   . IBS (irritable bowel syndrome)   . Lichen sclerosus   . MGUS (monoclonal gammopathy of unknown significance)   . Toxic shock syndrome ([place])    at age 63     Past Surgical History:   Past Surgical History:  Procedure Laterality Date  . BUNIONECTOMY Bilateral   . CHOLECYSTECTOMY  2001  . NASAL SEPTUM SURGERY    . TONSILLECTOMY       Past OB/GYN History: OB History  Gravida Para Term Preterm AB Living  3 2 2   1 2   SAB IAB Ectopic Multiple Live Births  1       2    # Outcome Date GA Lbr Len/2nd Weight Sex Delivery Anes PTL Lv  3 SAB           2 Term    9 lb 9 oz (4.338 kg) F Vag-Spont  LIV  1 Term    8 lb 3 oz (3.714 kg) F Vag-Spont   LIV   1st delivery had a 4th degree lac and retained placenta Menopausal: Yes, at age 61, Denies vaginal bleeding since menopause Last pap smear was 2021- negative.  Any history of abnormal pap smears: no.   Medications: She has a current medication list which includes the following prescription(s): bupropion, omeprazole, topiramate, ubrelvy, valacyclovir, vitamin b-12, augmented betamethasone dipropionate, betamethasone dipropionate, lidocaine, nystatin-triamcinolone, and tacrolimus.   Allergies: Patient is allergic to levaquin [levofloxacin] and methylchloroisothiazolinone [methylisothiazolinone].   Social History:  Social History   Tobacco Use  . Smoking status: Never  Smoker  . Smokeless tobacco: Never Used  Vaping Use  . Vaping Use: Never used  Substance Use Topics  . Alcohol use: Not Currently  . Drug use: Never    Relationship status: married She lives with husband.   She is not employed. Regular exercise: Yes: walking, 3.5 miles per day  Family History:   Family History  Problem Relation Age of Onset  . Diabetes Mother   . COPD Mother   . Congestive Heart Failure Mother   . Thyroid disease Mother   . Hemochromatosis Father   . Alcoholism Father   . Diabetes Sister   . Asthma Sister   . Asthma Brother   . Diabetes Maternal Aunt   . Breast cancer Maternal Aunt   . Diabetes Other      Review of Systems: Review of Systems  Constitutional: Negative for fever, malaise/fatigue and weight loss.  Respiratory: Negative for cough, shortness of breath and wheezing.   Cardiovascular: Negative for chest pain, palpitations and leg swelling.  Gastrointestinal: Negative for abdominal pain and blood in stool.  Genitourinary: Negative for dysuria.  Musculoskeletal: Negative for myalgias.  Skin: Negative for rash.  Neurological: Positive for dizziness and headaches.  Endo/Heme/Allergies: Does not bruise/bleed easily.  Psychiatric/Behavioral: Positive for depression. The patient is nervous/anxious.      OBJECTIVE Physical Exam: Vitals:   04/21/20 1106  BP: 137/75  Pulse: 80  Weight: 200 lb (90.7 kg)  Height: 5\' 9"  (1.753 m)    Physical Exam Constitutional:      General: She is not in acute distress. Pulmonary:     Effort: Pulmonary effort is normal.  Abdominal:     General: There is no distension.     Palpations: Abdomen is soft.     Tenderness: There is no abdominal tenderness. There is no rebound.  Musculoskeletal:        General: No swelling. Normal range of motion.  Skin:    General: Skin is warm and dry.     Findings: No rash.  Neurological:     Mental Status: She is alert and oriented to person, place, and time.   Psychiatric:        Mood and Affect: Mood normal.        Behavior: Behavior normal.      GU / Detailed Urogynecologic Evaluation:  Pelvic Exam: Normal external female genitalia; Bartholin's and Skene's glands normal in appearance; urethral meatus normal in appearance, no urethral masses or discharge.   CST: positive  Speculum exam reveals normal vaginal mucosa with atrophy. Cervix normal appearance. Uterus normal single, nontender. Adnexa no mass, fullness, tenderness.    Pelvic floor strength I/V, puborectalis II/V external anal sphincter III/V  Pelvic floor musculature: Right levator non-tender, Right obturator non-tender, Left levator non-tender, Left obturator non-tender  POP-Q:   POP-Q  -2  Aa   -2                                           Ba  -8.5                                              C   4                                            Gh  3.5                                            Pb  11                                            tvl   0                                            Ap  0                                            Bp  -9.5                                              D     Rectal Exam:  Perineum with dovetail sign, scarring at introitus. Normal sphincter tone, large distal rectocele, enterocoele not present, no rectal masses, noted dyssynergia when asking the patient to bear down.  Post-Void Residual (PVR) by Bladder Scan: In order to evaluate bladder emptying, we discussed obtaining a postvoid residual and she agreed to this procedure.  Procedure: The ultrasound unit was placed on the patient's abdomen in the suprapubic region after the patient had voided. A PVR of 3 ml was obtained by bladder scan.  Laboratory Results: POC urine: negative  I visualized the urine specimen, noting the specimen to be clear yellow  ASSESSMENT AND PLAN Ms. Dearcos is a 55 y.o. with:  1. Prolapse of  posterior vaginal wall   2. Perineal insufficiency   3. SUI (stress urinary incontinence, female)   4. Urinary urgency    1. Stage I anterior, Stage II posterior, Stage I apical prolapse - For treatment of pelvic organ prolapse, we discussed options for management including expectant management, conservative management, and surgical management, such as Kegels, a pessary, pelvic floor physical therapy, and specific surgical procedures. - She is interested in proceeding with surgery: posterior repair and perineorrhaphy  2. SUI For treatment of stress urinary incontinence,  non-surgical options include expectant management, weight loss, physical therapy, as  well as a pessary.  Surgical options include a midurethral sling, Burch urethropexy, and transurethral injection of a bulking agent. - She would like a sling at the time of prolapse repair. Will have her undergo urodynamic testing due to mixed incontinence.   3. Urgency - not as frequent as SUI symptoms, not bothersome at this time  Return for urodynamic testing.    Jaquita Folds, MD   Medical Decision Making:  - Reviewed/ ordered a clinical laboratory tes - Reviewed/ ordered medicine test - Review and summation of prior records

## 2020-04-20 ENCOUNTER — Other Ambulatory Visit: Payer: Self-pay | Admitting: Neurology

## 2020-04-21 ENCOUNTER — Ambulatory Visit (INDEPENDENT_AMBULATORY_CARE_PROVIDER_SITE_OTHER): Payer: BC Managed Care – PPO | Admitting: Obstetrics and Gynecology

## 2020-04-21 ENCOUNTER — Encounter: Payer: Self-pay | Admitting: Obstetrics and Gynecology

## 2020-04-21 ENCOUNTER — Other Ambulatory Visit: Payer: Self-pay

## 2020-04-21 VITALS — BP 137/75 | HR 80 | Ht 69.0 in | Wt 200.0 lb

## 2020-04-21 DIAGNOSIS — R3915 Urgency of urination: Secondary | ICD-10-CM

## 2020-04-21 DIAGNOSIS — N8189 Other female genital prolapse: Secondary | ICD-10-CM

## 2020-04-21 DIAGNOSIS — N393 Stress incontinence (female) (male): Secondary | ICD-10-CM | POA: Diagnosis not present

## 2020-04-21 DIAGNOSIS — N816 Rectocele: Secondary | ICD-10-CM | POA: Diagnosis not present

## 2020-04-21 LAB — POCT URINALYSIS DIPSTICK
Appearance: NORMAL
Bilirubin, UA: NEGATIVE
Blood, UA: NEGATIVE
Glucose, UA: NEGATIVE
Ketones, UA: NEGATIVE
Leukocytes, UA: NEGATIVE
Nitrite, UA: NEGATIVE
Protein, UA: NEGATIVE
Spec Grav, UA: 1.02 (ref 1.010–1.025)
Urobilinogen, UA: 0.2 E.U./dL
pH, UA: 5 (ref 5.0–8.0)

## 2020-04-21 NOTE — Patient Instructions (Signed)
You have a stage 2 (out of 4) prolapse.  We discussed the fact that it is not life threatening but there are several treatment options. For treatment of pelvic organ prolapse, we discussed options for management including expectant management, conservative management, and surgical management, such as Kegels, a pessary, pelvic floor physical therapy, and specific surgical procedures.      URODYNAMICS (UDS) TEST INFORMATION  IMPORTANT: Please try to arrive with a comfortably full bladder!    What is UDS? Urodynamics is a bladder test used to evaluate how your bladder and urethra (tube you urinate out of) work to help find out the cause of your bladder symptoms and evaluate your bladder function in order to make the best treatment plan for you.   What to expect? A nurse will perform the test and will be with you during the entire exam. First we will have to empty your bladder on a special toilet.  After you have emptied your bladder, very small catheters (plastic tubing) will be placed into your bladder and into your vagina (or rectum). These special small catheters measure pressure to help measure your bladder function.  Your bladder will be gently filled with water and you will be asked to cough and strain at several different points during the test.   You will then be asked to empty your bladder in the special toilet with the catheters in place. Most patients can urinate (pee) easily with the catheters in place since the catheters are so small. In total this procedure lasts about 45 minutes to 1 hour.  After your test is completed, you will return (or possibly be seen the same day) to review the results, talk about treatment options and make a plan moving forward.  

## 2020-04-23 ENCOUNTER — Ambulatory Visit
Admission: RE | Admit: 2020-04-23 | Discharge: 2020-04-23 | Disposition: A | Discharge status: Home / Self Care | Payer: BC Managed Care – PPO | Source: Ambulatory Visit

## 2020-04-23 ENCOUNTER — Other Ambulatory Visit: Payer: Self-pay

## 2020-04-23 DIAGNOSIS — Z1231 Encounter for screening mammogram for malignant neoplasm of breast: Secondary | ICD-10-CM

## 2020-05-04 NOTE — Progress Notes (Signed)
NEUROLOGY FOLLOW UP OFFICE NOTE  Gwendolyn Ramos [id number]  Assessment/Plan:   Migraine without aura, without status migrainosus, not intractable  1.  Migraine prevention:  Topiramate 75mg  at bedtime 2.  Migraine rescue:  Ubrelvy 100mg  3.  Limit use of pain relievers to no more than 2 days out of week to prevent risk of rebound or medication-overuse headache. 4.  Keep headache diary 5.  Follow up one year  Subjective:  Gwendolyn Ramos is a 55 year old female with MGUS who follows up for migraines.  UPDATE: Last seen in March 2021.  Increased topiramate.  Prescribed [name] which is effective Intensity:  Moderate (rarely severe) Duration:  If [name] taken immediately, then quickly, otherwise may need to repeat dose and aborts in 2 hours. Frequency:  2 a month Frequency of abortive medication: no other analgesics/NSAIDs or triptans Current NSAIDS:  none Current analgesics:  none Current triptans:  none Current ergotamine:  none Current anti-emetic:  none Current muscle relaxants:  none Current anti-anxiolytic:  none Current sleep aide:  none Current Antihypertensive medications:  none Current Antidepressant medications:  Wellbutrin Current Anticonvulsant medications:  topiramate 75mg  at bedtime Current anti-CGRP:  Ubrelvy 100mg  Current Vitamins/Herbal/Supplements:  B12 Current Antihistamines/Decongesrants:  none Other therapy:  Aroma therapy.  She does receive Botox for cosmetic reasons. Hormone/birth control:  none  Caffeine:  1 to 2 cups of coffee daily Diet:  At least 64 oz water daily. Does not skip meals Exercise:  Routine.  Walk, golf, tennis Depression:  Yes but stable; Anxiety:  no Other pain:  no Sleep hygiene:  good  HISTORY: Onset:  High school and college Location:  1.  Diffuse; 2.  Unilateral either side; 3.  Bilateral retro-orbital.  Sometimes associated neck pain. Quality:  1.  Pressure; 2.  Throbbing; 3.  Stabbing Initial Intensity:   1.  Mild-moderate; 2.  Severe; 3.  Severe.  She denies new headache, thunderclap headache or severe headache that wakes her from sleep. Aura:  no Associated symptoms:  Photophobia, phonophobia, osmophobia, dizziness, sometimes blurred vision.  No recent nausea or vomiting.  She denies associated unilateral numbness or weakness. Initial Duration:  Hour to all day Initial Frequency:  Daily headache (severe headaches occur 1 to 2 times a month) Frequency of abortive medication: She pre-treats with Aleve to prevent progression into migraine.  Takes Aleve 4 to 5 days a week. Triggers:  Valsalva, hitting head, menopause; neck pain; perfumes or gasoline, hunger, sugar/sweets Relieving factors:  Aroma therapy; hot shower Activity:  Aggravates.   Past NSAIDS:  Advil, Aleve (upsets stomach) Past analgesics:  Tylenol with codeine; Tylenol Past abortive triptans:  Imitrex, Maxalt Past abortive ergotamine:  none Past muscle relaxants:  Soma; Flexeril Past anti-emetic:  none Past antihypertensive medications:  none Past antidepressant medications:  none Past anticonvulsant medications:  none Past anti-CGRP:  none Past vitamins/Herbal/Supplements:  none Past antihistamines/decongestants:  none Other past therapies:  none   Family history of headache:  Mother (migraines); daughter (migraines)  PAST MEDICAL HISTORY: Past Medical History:  Diagnosis Date  . Anxiety   . Depression   . GERD (gastroesophageal reflux disease)   . 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 HSV-1 infection   . IBS (irritable bowel syndrome)   . Lichen sclerosus   . MGUS (monoclonal gammopathy of unknown significance)   . Toxic shock syndrome ([facility])    at age 89    MEDICATIONS: Current Outpatient Medications on File Prior to  Visit  Medication Sig Dispense Refill  . augmented betamethasone dipropionate (DIPROLENE-AF) 0.05 % ointment Apply topically 2 (two) times daily.  (Patient not taking: Reported on 04/21/2020)    . betamethasone dipropionate 0.05 % cream Apply topically 2 (two) times daily. (Patient not taking: Reported on 04/21/2020)    . buPROPion (WELLBUTRIN SR) 150 MG 12 hr tablet Take 150 mg by mouth 2 (two) times daily.    Marland Kitchen lidocaine (XYLOCAINE) 5 % ointment Apply 1 application topically 4 (four) times daily as needed. (Patient not taking: Reported on 04/21/2020) 1.25 g 0  . nystatin-triamcinolone (MYCOLOG II) cream Apply 1 application topically 2 (two) times daily. Apply to affected area BID for up to 7 days. (Patient not taking: Reported on 04/21/2020) 60 g 0  . omeprazole (PRILOSEC) 20 MG capsule Take 20 mg by mouth daily.    . tacrolimus (PROTOPIC) 0.1 % ointment Apply topically. (Patient not taking: Reported on 04/21/2020)    . topiramate (TOPAMAX) 25 MG tablet TAKE 3 TABLETS BY MOUTH AT BEDTIME. 90 tablet 0  . UBRELVY 100 MG TABS TAKE 1 TABLET BY MOUTH AS NEEDED (MAY REPEAT IN 2 HOURS IF NEEDED. MAXIMUM 2 TABLETS IN 24 HOURS.). 10 tablet 3  . valACYclovir (VALTREX) 500 MG tablet Take 1 tablet (500 mg total) by mouth daily. 90 tablet 4  . vitamin B-12 (CYANOCOBALAMIN) 1000 MCG tablet Take 1,000 mcg by mouth daily.     No current facility-administered medications on file prior to visit.    ALLERGIES: Allergies  Allergen Reactions  . Levaquin [Levofloxacin]     High fever, elevated heart rate  . Methylchloroisothiazolinone [Methylisothiazolinone]     FAMILY HISTORY: Family History  Problem Relation Age of Onset  . Diabetes Mother   . COPD Mother   . Congestive Heart Failure Mother   . Thyroid disease Mother   . Hemochromatosis Father   . Alcoholism Father   . Diabetes Sister   . Asthma Sister   . Asthma Brother   . Diabetes Maternal Aunt   . Breast cancer Maternal Aunt   . Diabetes Other       Objective:  Blood pressure 117/80, pulse 83, height 5\' 9"  (1.753 m), weight 202 lb (91.6 kg), SpO2 97 %. General: No acute distress.   Patient appears well-groomed.   Head:  Normocephalic/atraumatic Eyes:  Fundi examined but not visualized Neck: supple, no paraspinal tenderness, full range of motion Heart:  Regular rate and rhythm Lungs:  Clear to auscultation bilaterally Back: No paraspinal tenderness Neurological Exam: alert and oriented to person, place, and time. Attention span and concentration intact, recent and remote memory intact, fund of knowledge intact.  Speech fluent and not dysarthric, language intact.  Reports 10% reduced sensation to right V2, otherwise CN II-XII intact. Bulk and tone normal, muscle strength 5/5 throughout.  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  Deep tendon reflexes 2+ throughout, toes downgoing.  Finger to nose and heel to shin testing intact.  Gait normal, Romberg negative.     Metta Clines, DO  CC: [name], MD

## 2020-05-05 NOTE — Progress Notes (Signed)
[facility] Urodynamics Procedure  Referring Physician: [name], MD Date of Procedure: 05/07/2020  Gwendolyn Ramos is a 55 y.o. female who presents for urodynamic evaluation. Indication(s) for study: mixed incontinence  Vital Signs: BP 122/81   Pulse 80   Ht 5\' 9"  (1.753 m)   Wt 202 lb (91.6 kg)   LMP  (LMP Unknown) Comment: patient had spotting in October 2020  BMI 29.83 kg/m   Laboratory Results: A catheterized urine specimen revealed:  POC urine: negative  Voiding Diary: Did not complete a voiding diary.  Procedure Timeout:  The correct patient was verified and the correct procedure was verified. The patient was in the correct position and safety precautions were reviewed based on at the patient's history.  Urodynamic Procedure A 65F dual lumen urodynamics catheter was placed under sterile conditions into the patient's bladder. A 65F catheter was placed into the rectum in order to measure abdominal pressure. EMG patches were placed in the appropriate position.  All connections were confirmed and calibrations/adjusted made. Saline was instilled into the bladder through the dual lumen catheters.  Cough/valsalva pressures were measured periodically during filling.  Patient was allowed to void.  The bladder was then emptied of its residual.  UROFLOW: Revealed a Qmax of 11.9 mL/sec.  She voided 82 mL and had a residual of 12 mL.  It was a normal pattern and represented normal habits though interpretation limited due to low voided volume.  CMG: This was performed with sterile water in the sitting position at a fill rate of 20 mL/min.    First sensation of fullness was 30 mLs,  First urge was 51 mLs,  Strong urge was 128 mLs and  Capacity was 602 mLs  Stress incontinence was demonstrated Lowest positive Barrier CLPP was 85 cmH20 at 378 ml, in the standing position.  Highest negative Barrier VLPP was 86 cmH20 at 378 ml. No leakage noted in the  sitting position.   Detrusor function was normal, with no phasic contractions seen.    Compliance:  normal. End fill detrusor pressure was -7cmH20.    UPP: MUCP with/ without barrier reduction was 68 cm of water.    MICTURITION STUDY: Voiding was performed with reduction using scopettes in the sitting position.  Pdet at Qmax was 17 cm of water.  Qmax was 18 mL/sec.  It was a normal pattern.  She voided 578 mL and had a residual of 25 mL.  It was a volitional void, sustained detrusor contraction was present and abdominal straining was not present  EMG: This was performed with patches.  She had voluntary contractions, recruitment with fill was present and urethral sphincter was relaxed with void.  The details of the procedure with the study tracings have been scanned into EPIC.   Urodynamic Impression:  1. Sensation was increased; capacity was normal 2. Stress Incontinence was demonstrated at normal pressures; 3. Detrusor Overactivity was not demonstrated. 4. Emptying was normal with a normal PVR, a sustained detrusor contraction present,  abdominal straining not present, normal urethral sphincter activity on EMG.  Plan: - The patient will follow up  to discuss the findings and treatment options.   Jaquita Folds, MD

## 2020-05-07 ENCOUNTER — Encounter: Payer: Self-pay | Admitting: Obstetrics and Gynecology

## 2020-05-07 ENCOUNTER — Encounter: Payer: Self-pay | Admitting: Neurology

## 2020-05-07 ENCOUNTER — Other Ambulatory Visit: Payer: Self-pay

## 2020-05-07 ENCOUNTER — Ambulatory Visit (INDEPENDENT_AMBULATORY_CARE_PROVIDER_SITE_OTHER): Payer: BC Managed Care – PPO | Admitting: Obstetrics and Gynecology

## 2020-05-07 ENCOUNTER — Ambulatory Visit: Payer: BC Managed Care – PPO | Admitting: Neurology

## 2020-05-07 VITALS — BP 122/81 | HR 80 | Ht 69.0 in | Wt 202.0 lb

## 2020-05-07 VITALS — BP 117/80 | HR 83 | Ht 69.0 in | Wt 202.0 lb

## 2020-05-07 DIAGNOSIS — G43009 Migraine without aura, not intractable, without status migrainosus: Secondary | ICD-10-CM | POA: Diagnosis not present

## 2020-05-07 DIAGNOSIS — R3915 Urgency of urination: Secondary | ICD-10-CM

## 2020-05-07 DIAGNOSIS — N393 Stress incontinence (female) (male): Secondary | ICD-10-CM | POA: Diagnosis not present

## 2020-05-07 MED ORDER — TOPIRAMATE 25 MG PO TABS: 75.0000 mg | ORAL_TABLET | Freq: Every day | ORAL | 1 refills | Status: DC

## 2020-05-07 MED ORDER — UBRELVY 100 MG PO TABS: 1.0000 | ORAL_TABLET | ORAL | 5 refills | Status: DC | PRN

## 2020-05-07 NOTE — Progress Notes (Signed)
Gwendolyn Ramos ([name]) Rx #: [id number] Gwendolyn Ramos 100MG  tablets   Form [employer] [place] [employer] Electronic Request Form (CB) Created 32 minutes ago Sent to Plan 2 minutes ago Plan Response 2 minutes ago Submit Clinical Questions less than a minute ago Determination Favorable less than a minute ago Message from Plan Effective from 05/07/2020 through 07/29/2020.

## 2020-05-07 NOTE — Patient Instructions (Signed)
Taking Care of Yourself after Urodynamics, Cystoscopy, Coaptite Injection, or Botox Injection    .  Drink plenty of water for a day or two following your procedure. Try to have about 8 ounces (one cup) at a time, and do this 6 times or more per day unless you have fluid restrictitons  .  AVOID irritative beverages such as coffee, tea, soda, alcoholic or citrus drinks for a day or two, as this may cause burning with urination.  . For the first 1-2 days after the procedure, your urine may be pink or red in color. You may have some blood in your urine as a normal side effect of the procedure. Large amounts of bleeding or difficulty urinating are NOT normal. Call the nurse line if this happens or go to the nearest Emergency Room if the bleeding is heavy or you cannot urinate at all and it is after hours. If you had a Coaptite injection in the urethra and need to be catheterized afterward, ask for a pediatric catheter to be used (size 10 or 12-French) so the Coaptite material is not pushed out of place.    Dennis Bast may experience some discomfort or a burning sensation with urination after having this procedure. You can use over the counter Azo or pyridium to help with burning and follow the instructions on the packaging. If it does not improve within 1-2 days, or other symptoms appear (fever, chills, or difficulty urinating) call the office to speak to a nurse.   .  You may return to normal daily activities such as work, school, driving, exercising and housework on the day of the procedure.  .  If you need a return appointment, the front desk staff will arrange it when you check out.

## 2020-05-07 NOTE — Patient Instructions (Signed)
Refilled topiramate and [name]

## 2020-05-13 ENCOUNTER — Encounter: Payer: Self-pay | Admitting: Obstetrics and Gynecology

## 2020-05-21 NOTE — Progress Notes (Signed)
[facility] Return Visit  SUBJECTIVE  History of Present Illness: Gwendolyn Ramos is a 55 y.o. female seen in follow-up after urodynamic testing.   Urodynamic Impression:  1. Sensation was increased; capacity was normal 2. Stress Incontinence was demonstrated at normal pressures; 3. Detrusor Overactivity was not demonstrated. 4. Emptying was normal with a normal PVR, a sustained detrusor contraction present,  abdominal straining not present, normal urethral sphincter activity on EMG.  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   Past Surgical History: She  has a past surgical history that includes Bunionectomy (Bilateral); Tonsillectomy; Nasal septum surgery; and Cholecystectomy (2001).   Medications: She has a current medication list which includes the following prescription(s): betamethasone dipropionate, bupropion, omeprazole, topiramate, ubrelvy, valacyclovir, vitamin b-12, augmented betamethasone dipropionate, lidocaine, nystatin-triamcinolone, and tacrolimus.   Allergies: Patient is allergic to levaquin [levofloxacin] and methylchloroisothiazolinone [methylisothiazolinone].   Social History: Patient  reports that she has never smoked. She has never used smokeless tobacco. She reports previous alcohol use. She reports that she does not use drugs.      OBJECTIVE     Physical Exam: Vitals:   05/22/20 1302  BP: (!) 157/76  Pulse: 86  Weight: 202 lb (91.6 kg)  Height: 5\' 9"  (1.753 m)   Gen: No apparent distress, A&O x 3.  Detailed Urogynecologic Evaluation:  Deferred. Prior exam showed (04/21/20):  POP-Q  -2                                             Aa   -2                                           Ba  -8.5                                              C   4                                            Gh  3.5                                            Pb  11                                            tvl   0  Ap  0                                            Bp  -9.5                                              D      ASSESSMENT AND PLAN    Gwendolyn Ramos is a 55 y.o. with:  1. Prolapse of posterior vaginal wall   2. Perineal insufficiency   3. SUI (stress urinary incontinence, female)     Plan for surgery: Exam under anesthesia, posterior repair, perineorrhaphy, midurethral sling and cystoscopy  - We reviewed the patient's specific anatomic and functional findings, with the assistance of diagrams, and together finalized the above procedure. The planned surgical procedures were discussed along with the surgical risks outlined below, which were also provided on a detailed handout. Additional treatment options including expectant management, conservative management, medical management were discussed where appropriate.  We reviewed the benefits and risks of each treatment option.   We discussed that this may not help with straining with defecation and that after the surgery she may benefit from pelvic floor physical therapy. We also reviewed the importance of adhering to a bowel regimen.   General Surgical Risks: For all procedures, there are risks of bleeding, infection, damage to surrounding organs including but not limited to bowel, bladder, blood vessels, ureters and nerves, and need for further surgery if an injury were to occur. These risks are all low with minimally invasive surgery.    If surgery is vaginal, there is also a low risk of possible conversion to laparoscopy or open abdominal incision where indicated. Very rare risks include blood transfusion, blood  clot, heart attack, pneumonia, or death.   There is also a risk of short-term postoperative urinary retention with need to use a catheter. About half of patients need to go home from surgery with a catheter, which is then later removed in the office. The risk of long-term need for a catheter is very low. There is also a risk of worsening of overactive bladder.   Sling: The effectiveness of a midurethral vaginal mesh sling is approximately 85%, and thus, there will be times when you may leak urine after surgery, especially if your bladder is full or if you have a strong cough. There is a balance between making the sling tight enough to treat your leakage but not too tight so that you have long-term difficulty emptying your bladder. A mesh sling will not directly treat overactive bladder/urge incontinence and may worsen it.  There is an FDA safety notification on vaginal mesh procedures for prolapse but NOT mesh slings. We have extensive experience and training with mesh placement and we have close postoperative follow up to identify any potential complications from mesh. It is important to realize that this mesh is a permanent implant that cannot be easily removed. There are rare risks of mesh exposure (2-4%), pain with intercourse (0-7%), and infection (<1%). The risk of mesh exposure if more likely in a woman with risks for poor healing (prior radiation, poorly controlled diabetes, or immunocompromised). The risk of new or worsened chronic pain after mesh implant is more common in women with baseline chronic pain and/or  poorly controlled anxiety or depression. Approximately 2-4% of patients will experience longer-term post-operative voiding dysfunction that may require surgical revision of the sling. We also reviewed that postoperatively, her stream may not be as strong as before surgery.   Prolapse (with or without mesh): Risk factors for surgical failure  include things that put pressure on your pelvis  and the surgical repair, including obesity, chronic cough, and heavy lifting or straining (including lifting children or adults, straining on the toilet, or lifting heavy objects such as furniture or anything weighing >25 lbs. Risks of recurrence is 20-30% with vaginal native tissue repair and a less than 10% with sacrocolpopexy with mesh.    - For preop Visit:  She is required to have a visit within 30 days of her surgery.    - Medical clearance: not required. Has been followed by Oncology for MGUS but has not had any significant changes in her blood work. - Anticoagulant use: No - Medicaid Hysterectomy form: No - Accepts blood transfusion: Yes - Expected length of stay: outpatient  Request sent for surgery scheduling.   Jaquita Folds, MD  Time spent: I spent 30 minutes dedicated to the care of this patient on the date of this encounter to include pre-visit review of records, face-to-face time with the patient discussing surgery and post visit documentation.

## 2020-05-22 ENCOUNTER — Encounter: Payer: Self-pay | Admitting: Obstetrics and Gynecology

## 2020-05-22 ENCOUNTER — Other Ambulatory Visit: Payer: Self-pay

## 2020-05-22 ENCOUNTER — Ambulatory Visit (INDEPENDENT_AMBULATORY_CARE_PROVIDER_SITE_OTHER): Payer: BC Managed Care – PPO | Admitting: Obstetrics and Gynecology

## 2020-05-22 VITALS — BP 157/76 | HR 86 | Ht 69.0 in | Wt 202.0 lb

## 2020-05-22 DIAGNOSIS — N393 Stress incontinence (female) (male): Secondary | ICD-10-CM | POA: Diagnosis not present

## 2020-05-22 DIAGNOSIS — N8189 Other female genital prolapse: Secondary | ICD-10-CM

## 2020-05-22 DIAGNOSIS — N816 Rectocele: Secondary | ICD-10-CM | POA: Diagnosis not present

## 2020-06-02 ENCOUNTER — Ambulatory Visit: Payer: BC Managed Care – PPO | Admitting: Obstetrics and Gynecology

## 2020-07-29 ENCOUNTER — Telehealth: Payer: Self-pay | Admitting: Oncology

## 2020-07-29 NOTE — Progress Notes (Signed)
Gwendolyn Ramos (Key: [id number]) [name] 100MG  tablets   Form Blue Building control surveyor Form (CB) Created 2 minutes ago Sent to Plan 1 minute ago Plan Response 1 minute ago Submit Clinical Questions Determination Clinical Questions Are Ready Fill out the questions below and click "Send to Plan." The plan requires answers to the clinical questions for this electronic prior authorization.

## 2020-07-29 NOTE — Telephone Encounter (Signed)
R/s appt per 7/6 sch msg. Pt aware.  

## 2020-07-30 NOTE — Progress Notes (Signed)
Gwendolyn Ramos denied cause patient has no insurance coverage. SHU:[id number]

## 2020-08-03 NOTE — Progress Notes (Signed)
PA had to start on paper. Per Covermymeds unable to look up pt with this key

## 2020-08-05 NOTE — Progress Notes (Signed)
Ubrelvy 100mg  approved 07/29/20 until 07/28/21 reference number  [id number]

## 2020-08-24 DIAGNOSIS — U071 COVID-19: Secondary | ICD-10-CM

## 2020-08-24 DIAGNOSIS — Z8616 Personal history of COVID-19: Secondary | ICD-10-CM

## 2020-08-24 HISTORY — DX: Personal history of COVID-19: Z86.16

## 2020-08-24 HISTORY — DX: COVID-19: U07.1

## 2020-08-25 ENCOUNTER — Encounter: Payer: BC Managed Care – PPO | Admitting: Obstetrics and Gynecology

## 2020-09-03 ENCOUNTER — Other Ambulatory Visit: Payer: Self-pay

## 2020-09-03 ENCOUNTER — Inpatient Hospital Stay: Payer: BC Managed Care – PPO | Attending: Oncology

## 2020-09-03 DIAGNOSIS — Z8249 Family history of ischemic heart disease and other diseases of the circulatory system: Secondary | ICD-10-CM | POA: Diagnosis not present

## 2020-09-03 DIAGNOSIS — Z811 Family history of alcohol abuse and dependence: Secondary | ICD-10-CM | POA: Insufficient documentation

## 2020-09-03 DIAGNOSIS — Z803 Family history of malignant neoplasm of breast: Secondary | ICD-10-CM | POA: Insufficient documentation

## 2020-09-03 DIAGNOSIS — Z79899 Other long term (current) drug therapy: Secondary | ICD-10-CM | POA: Insufficient documentation

## 2020-09-03 DIAGNOSIS — K219 Gastro-esophageal reflux disease without esophagitis: Secondary | ICD-10-CM | POA: Insufficient documentation

## 2020-09-03 DIAGNOSIS — Z836 Family history of other diseases of the respiratory system: Secondary | ICD-10-CM | POA: Insufficient documentation

## 2020-09-03 DIAGNOSIS — Z8349 Family history of other endocrine, nutritional and metabolic diseases: Secondary | ICD-10-CM | POA: Diagnosis not present

## 2020-09-03 DIAGNOSIS — D472 Monoclonal gammopathy: Secondary | ICD-10-CM | POA: Insufficient documentation

## 2020-09-03 DIAGNOSIS — Z833 Family history of diabetes mellitus: Secondary | ICD-10-CM | POA: Insufficient documentation

## 2020-09-03 LAB — COMPREHENSIVE METABOLIC PANEL
ALT: 34 U/L (ref 0–44)
AST: 28 U/L (ref 15–41)
Albumin: 4.1 g/dL (ref 3.5–5.0)
Alkaline Phosphatase: 66 U/L (ref 38–126)
Anion gap: 8 (ref 5–15)
BUN: 12 mg/dL (ref 6–20)
CO2: 20 mmol/L — ABNORMAL LOW (ref 22–32)
Calcium: 10 mg/dL (ref 8.9–10.3)
Chloride: 111 mmol/L (ref 98–111)
Creatinine, Ser: 0.98 mg/dL (ref 0.44–1.00)
GFR, Estimated: >60 mL/min (ref >60)
Glucose, Bld: 108 mg/dL — ABNORMAL HIGH (ref 70–99)
Potassium: 4.2 mmol/L (ref 3.5–5.1)
Sodium: 139 mmol/L (ref 135–145)
Total Bilirubin: 0.4 mg/dL (ref 0.3–1.2)
Total Protein: 8.7 g/dL — ABNORMAL HIGH (ref 6.5–8.1)

## 2020-09-03 LAB — IRON AND TIBC
Iron: 97 ug/dL (ref 41–142)
Saturation Ratios: 37 % (ref 21–57)
TIBC: 261 ug/dL (ref 236–444)
UIBC: 164 ug/dL (ref 120–384)

## 2020-09-03 LAB — CBC WITH DIFFERENTIAL/PLATELET
Abs Immature Granulocytes: 0.03 10*3/uL (ref 0.00–0.07)
Basophils Absolute: 0.1 10*3/uL (ref 0.0–0.1)
Basophils Relative: 1 %
Eosinophils Absolute: 0.2 10*3/uL (ref 0.0–0.5)
Eosinophils Relative: 3 %
HCT: 42.1 % (ref 36.0–46.0)
Hemoglobin: 14.4 g/dL (ref 12.0–15.0)
Immature Granulocytes: 0 %
Lymphocytes Relative: 32 %
Lymphs Abs: 2.8 10*3/uL (ref 0.7–4.0)
MCH: 31.4 pg (ref 26.0–34.0)
MCHC: 34.2 g/dL (ref 30.0–36.0)
MCV: 91.7 fL (ref 80.0–100.0)
Monocytes Absolute: 1 10*3/uL (ref 0.1–1.0)
Monocytes Relative: 12 %
Neutro Abs: 4.6 10*3/uL (ref 1.7–7.7)
Neutrophils Relative %: 52 %
Platelets: 310 10*3/uL (ref 150–400)
RBC: 4.59 MIL/uL (ref 3.87–5.11)
RDW: 14.1 % (ref 11.5–15.5)
WBC: 8.8 10*3/uL (ref 4.0–10.5)
nRBC: 0 % (ref 0.0–0.2)

## 2020-09-03 LAB — FERRITIN: Ferritin: 82 ng/mL (ref 11–307)

## 2020-09-04 LAB — KAPPA/LAMBDA LIGHT CHAINS
Kappa free light chain: 43.9 mg/L — ABNORMAL HIGH (ref 3.3–19.4)
Kappa, lambda light chain ratio: 5.29 — ABNORMAL HIGH (ref 0.26–1.65)
Lambda free light chains: 8.3 mg/L (ref 5.7–26.3)

## 2020-09-08 ENCOUNTER — Other Ambulatory Visit: Payer: Self-pay

## 2020-09-08 LAB — MULTIPLE MYELOMA PANEL, SERUM
Albumin SerPl Elph-Mcnc: 4.1 g/dL (ref 2.9–4.4)
Albumin/Glob SerPl: 1.1 (ref 0.7–1.7)
Alpha 1: 0.2 g/dL (ref 0.0–0.4)
Alpha2 Glob SerPl Elph-Mcnc: 0.7 g/dL (ref 0.4–1.0)
B-Globulin SerPl Elph-Mcnc: 0.9 g/dL (ref 0.7–1.3)
Gamma Glob SerPl Elph-Mcnc: 2.1 g/dL — ABNORMAL HIGH (ref 0.4–1.8)
Globulin, Total: 4 g/dL — ABNORMAL HIGH (ref 2.2–3.9)
IgA: 57 mg/dL — ABNORMAL LOW (ref 87–352)
IgG (Immunoglobin G), Serum: 2456 mg/dL — ABNORMAL HIGH (ref 586–1602)
IgM (Immunoglobulin M), Srm: 76 mg/dL (ref 26–217)
M Protein SerPl Elph-Mcnc: 1.8 g/dL — ABNORMAL HIGH
Total Protein ELP: 8.1 g/dL (ref 6.0–8.5)

## 2020-09-14 NOTE — Progress Notes (Signed)
[facility]  [phone] [phone]     ID: Chidera Kuan DOB: 15-Apr-1965  MR#: [id number]  [id number]  Patient Care Team: [name], MD as PCP - General (Family Medicine) Magrinat, Virgie Dad, MD as Consulting Physician (Oncology) Megan Salon, MD as Consulting Physician (Gynecology) Pieter Partridge, DO as Consulting Physician (Neurology) Aurea Graff OTHER MD:  I connected with Melida Quitter on 09/14/20 at  9:00 AM EDT by video enabled telemedicine visit and verified that I am speaking with the correct person using two identifiers.   I discussed the limitations, risks, security and privacy concerns of performing an evaluation and management service by telemedicine and the availability of in-person appointments. I also discussed with the patient that there may be a patient responsible charge related to this service. The patient expressed understanding and agreed to proceed.   Other persons participating in the visit and their role in the encounter: None  Patient's location: Home Provider's location: [place]: MGUS; hereditary hemochromatosis  CURRENT TREATMENT: Observation   INTERVAL HISTORY: Danyalle was contacted today for follow up of her MGUS and hereditary hemochromatosis.   We have been checking lab work every 6 months, doing visits once a year.  Results for QUETZALLI, LAFFERTY (MRN [id number]) as of 09/15/2020 09:18  Ref. Range 07/08/2019 17:01 07/08/2019 17:01 07/08/2019 17:01 07/08/2019 17:01 07/08/2019 17:01 07/08/2019 17:03 07/25/2019 12:08 09/09/2019 14:34 03/11/2020 12:16 03/18/2020 10:16 04/21/2020 11:13 04/23/2020 13:10 09/03/2020 08:15 09/03/2020 08:16  M Protein SerPl Elph-Mcnc Latest Ref Range: Not Observed g/dL        1.7 (H)  1.7 (H)   1.8 (H)   Results for MARIADEL, RHINEHART (MRN [id number]) as of 09/15/2020 09:18  Ref. Range 07/08/2019 17:01 07/08/2019 17:01  07/08/2019 17:01 07/08/2019 17:01 07/08/2019 17:01 07/08/2019 17:03 07/25/2019 12:08 09/09/2019 14:34 03/11/2020 12:16 03/18/2020 10:16 04/21/2020 11:13 04/23/2020 13:10 09/03/2020 08:15 09/03/2020 08:16  IgG (Immunoglobin G), Serum Latest Ref Range: 586 - 1,602 mg/dL        2,427 (H)  2,629 (H)   2,456 (H)   Results for JOLETT, SNEED (MRN [id number]) as of 09/15/2020 09:18  Ref. Range 07/08/2019 17:01 07/08/2019 17:01 07/08/2019 17:01 07/08/2019 17:01 07/08/2019 17:01 07/08/2019 17:03 07/25/2019 12:08 09/09/2019 14:34 03/11/2020 12:16 03/18/2020 10:16 04/21/2020 11:13 04/23/2020 13:10 09/03/2020 08:15 09/03/2020 08:16  Kappa, lambda light chain ratio Latest Ref Range: 0.26 - 1.65         7.65 (H)  5.61 (H)   5.29 (H)     REVIEW OF SYSTEMS: Caitrin did not know she had COVID but did test positive.  She essentially had no symptoms but later developed sinusitis.  She is being treated for that.  She walks 3 miles every morning.  She used to go to the gym twice a week as well but because of COVID as she is not currently doing that.  A detailed review of systems today was otherwise entirely benign   COVID 19 VACCINATION STATUS: Status post Pfizer x4 and COVID x2   HISTORY OF CURRENT ILLNESS: From the original intake note:  Arlisha Cerbone has a history of monoclonal gammopathy and hereditary hemochromatosis. She has been followed by Dr. Wilnette Kales since 2016 while living in [place].   She was tested for hemochromatosis because her father had that problem.  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  Dr. Bridgett Larsson has been following her every 6 months.  His most recent ferritin on June 26, 2018 was 64 and her transferrin saturation was 55%.  The patient also has an IgG kappa MGUS.  This is also being followed every 6 months.  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 ([place])    HSV-1 infection    IBS (irritable bowel syndrome)    Lichen sclerosus    MGUS (monoclonal gammopathy of unknown significance)    Toxic shock syndrome ([place])    at age 17    PAST SURGICAL HISTORY: Past Surgical History:  Procedure Laterality Date   BUNIONECTOMY Bilateral    CHOLECYSTECTOMY  2001   NASAL SEPTUM SURGERY     TONSILLECTOMY      FAMILY HISTORY: Family History  Problem Relation Age of Onset   Diabetes Mother    COPD Mother    Congestive Heart Failure Mother    Thyroid disease Mother    Hemochromatosis Father    Alcoholism Father    Diabetes Sister    Asthma Sister    Asthma Brother    Diabetes Maternal Aunt    Breast cancer Maternal Aunt    Diabetes Other   Patient's father was 3 years old when he died from alcoholic cirrhosis in the setting of hemochromatosis. Patient's mother is 85 years old as of January 2021.  The patient has 3 siblings, 2 full brothers and 1 half sister.  The patient's brothers have not been tested for hemochromatosis.  The patient's sister is by different father   GYNECOLOGIC HISTORY:  No LMP recorded (lmp unknown). Patient is postmenopausal. Menarche: 55 years old Age at first live birth: 55 years old [place] P 2 HRT no Hysterectomy? no BSO? 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  Her husband [name] runs [employer] and does a great deal of international traveling.  Their 2 daughters are [name], 20, studying business at [employer], and [place], [place], [place] psychology at [employer].    ADVANCED DIRECTIVES: In the absence of any documents to the contrary the patient's husband is her healthcare power of attorney   HEALTH  MAINTENANCE: Social History   Tobacco Use   Smoking status: Never   Smokeless tobacco: Never  Vaping Use   Vaping Use: Never used  Substance Use Topics   Alcohol use: Not Currently   Drug use: Never     Colonoscopy: 2017  PAP: Up-to-date  Bone density:    Allergies  Allergen Reactions   Levaquin [Levofloxacin]     High fever, elevated heart rate   Methylchloroisothiazolinone [Methylisothiazolinone]     Current Outpatient Medications  Medication Sig Dispense Refill   augmented betamethasone dipropionate (DIPROLENE-AF) 0.05 % ointment Apply topically 2 (two) times daily. (Patient not taking: No sig reported)     betamethasone dipropionate 0.05 % cream Apply topically 2 (two) times daily.     buPROPion (WELLBUTRIN SR) 150 MG 12 hr tablet Take 150 mg by mouth 2 (two) times daily.     lidocaine (XYLOCAINE) 5 % ointment Apply 1 application topically 4 (  four) times daily as needed. (Patient not taking: No sig reported) 1.25 g 0   nystatin-triamcinolone (MYCOLOG II) cream Apply 1 application topically 2 (two) times daily. Apply to affected area BID for up to 7 days. (Patient not taking: No sig reported) 60 g 0   omeprazole (PRILOSEC) 20 MG capsule Take 20 mg by mouth daily.     tacrolimus (PROTOPIC) 0.1 % ointment Apply topically. (Patient not taking: No sig reported)     topiramate (TOPAMAX) 25 MG tablet Take 3 tablets (75 mg total) by mouth at bedtime. 270 tablet 1   Ubrogepant (UBRELVY) 100 MG TABS Take 1 tablet by mouth as needed (May repeat in 2 hours if needed.  Maximum 2 tablets in 24 hours.). 10 tablet 5   valACYclovir (VALTREX) 500 MG tablet Take 1 tablet (500 mg total) by mouth daily. 90 tablet 4   vitamin B-12 (CYANOCOBALAMIN) 1000 MCG tablet Take 1,000 mcg by mouth daily.     No current facility-administered medications for this visit.    OBJECTIVE: White woman who appears well  There were no vitals filed for this visit.    There is no height or weight on file to  calculate BMI.   Wt Readings from Last 3 Encounters:  05/22/20 202 lb (91.6 kg)  05/07/20 202 lb (91.6 kg)  05/07/20 202 lb (91.6 kg)      ECOG FS:1 - Symptomatic but completely ambulatory  Telemedicine visit 09/15/2020    LAB RESULTS:  CMP     Component Value Date/Time   NA 139 09/03/2020 0815   K 4.2 09/03/2020 0815   CL 111 09/03/2020 0815   CO2 20 (L) 09/03/2020 0815   GLUCOSE 108 (H) 09/03/2020 0815   BUN 12 09/03/2020 0815   CREATININE 0.98 09/03/2020 0815   CALCIUM 10.0 09/03/2020 0815   PROT 8.7 (H) 09/03/2020 0815   ALBUMIN 4.1 09/03/2020 0815   AST 28 09/03/2020 0815   ALT 34 09/03/2020 0815   ALKPHOS 66 09/03/2020 0815   BILITOT 0.4 09/03/2020 0815   GFRNONAA >60 09/03/2020 0815   GFRAA >60 09/09/2019 1434    Lab Results  Component Value Date   TOTALPROTELP 8.1 09/03/2020    Lab Results  Component Value Date   WBC 8.8 09/03/2020   NEUTROABS 4.6 09/03/2020   HGB 14.4 09/03/2020   HCT 42.1 09/03/2020   MCV 91.7 09/03/2020   PLT 310 09/03/2020    No results found for: LABCA2  No components found for: [id number]  No results for input(s): INR in the last 168 hours.  No results found for: LABCA2  No results found for: [id number]  No results found for: [id number]  No results found for: [id number]  No results found for: CA2729  No components found for: HGQUANT  No results found for: CEA1 / No results found for: CEA1   No results found for: AFPTUMOR  No results found for: [facility]  Lab Results  Component Value Date   KPAFRELGTCHN 43.9 (H) 09/03/2020   LAMBDASER 8.3 09/03/2020   KAPLAMBRATIO 5.29 (H) 09/03/2020   (kappa/lambda light chains)  No results found for: HGBA, HGBA2QUANT, HGBFQUANT, HGBSQUAN (Hemoglobinopathy evaluation)   No results found for: LDH  Lab Results  Component Value Date   IRON 97 09/03/2020   TIBC 261 09/03/2020   IRONPCTSAT 37 09/03/2020   (Iron and TIBC)  Lab Results  Component Value Date   FERRITIN 82  09/03/2020    Urinalysis    Component Value Date/Time  BILIRUBINUR neg 04/21/2020 1113   PROTEINUR Negative 04/21/2020 1113   UROBILINOGEN 0.2 04/21/2020 1113   NITRITE neg 04/21/2020 1113   LEUKOCYTESUR Negative 04/21/2020 1113    STUDIES: No results found.   ELIGIBLE FOR AVAILABLE RESEARCH PROTOCOL: no  ASSESSMENT: 55 y.o. [place] woman with the following concerns:  (1) IgG kappa MGUS  (a) plan is to check SPEP/Lambda light chains every 6 months  (2) hemochromatosis: Compound heterozygote: C282Y and H63D positive  (a) plan is to check ferritin and hemoglobin saturation every 6 months  (b) consider phlebotomy once ferritin greater than 150 or saturation greater than 80%   PLAN: Devin had COVID (though she did not know it) today we checked her lab work.  Accordingly we have to take that a slight increase in her M spike with a grain of salt.  Nevertheless looking back over the year that has been a slight increase from 1.5-1.8.  The kappa lambda ratio is more reassuring.  I think it would be worthwhile to recheck everything in 3 months.  She will have a bone survey and lab work before seeing the mid November.  At that point very likely we can go back to every 55-monthfollow-up  She knows to call for any other issue that may develop before the next visit.   KAurea Graff  09/14/2020 9:16 PM Medical Oncology and Hematology [id number] [id number]. [phone]   Fax. [phone]  This document serves as a record of services personally performed by GLurline Del MD. It was created on his behalf by KWilburn Mylar a trained medical scribe. The creation of this record is based on the scribe's personal observations and the provider's statements to them.   I, GLurline DelMD, have reviewed the above documentation for accuracy and completeness, and I agree with the above.   *Total Encounter Time as  defined by the Centers for Medicare and Medicaid Services includes, in addition to the face-to-face time of a patient visit (documented in the note above) non-face-to-face time: obtaining and reviewing outside history, ordering and reviewing medications, tests or procedures, care coordination (communications with other health care professionals or caregivers) and documentation in the medical record.

## 2020-09-15 ENCOUNTER — Inpatient Hospital Stay (HOSPITAL_BASED_OUTPATIENT_CLINIC_OR_DEPARTMENT_OTHER): Payer: BC Managed Care – PPO | Admitting: Oncology

## 2020-09-15 ENCOUNTER — Other Ambulatory Visit: Payer: Self-pay

## 2020-09-15 ENCOUNTER — Telehealth (INDEPENDENT_AMBULATORY_CARE_PROVIDER_SITE_OTHER): Payer: BC Managed Care – PPO | Admitting: Obstetrics and Gynecology

## 2020-09-15 ENCOUNTER — Ambulatory Visit: Payer: Self-pay | Admitting: Oncology

## 2020-09-15 DIAGNOSIS — Z01818 Encounter for other preprocedural examination: Secondary | ICD-10-CM

## 2020-09-15 DIAGNOSIS — D472 Monoclonal gammopathy: Secondary | ICD-10-CM | POA: Diagnosis not present

## 2020-09-15 MED ORDER — OXYCODONE HCL 5 MG PO TABS: 5.0000 mg | ORAL_TABLET | ORAL | 0 refills | Status: DC | PRN

## 2020-09-15 MED ORDER — ACETAMINOPHEN 500 MG PO TABS: 500.0000 mg | ORAL_TABLET | Freq: Four times a day (QID) | ORAL | 0 refills | Status: DC | PRN

## 2020-09-15 MED ORDER — POLYETHYLENE GLYCOL 3350 17 GM/SCOOP PO POWD: 17.0000 g | Freq: Every day | ORAL | 0 refills | Status: DC

## 2020-09-15 MED ORDER — IBUPROFEN 600 MG PO TABS: 600.0000 mg | ORAL_TABLET | Freq: Four times a day (QID) | ORAL | 0 refills | Status: DC | PRN

## 2020-09-15 NOTE — Progress Notes (Signed)
[facility] Pre-Operative exam- Video visit  Patient location: at home in [facility]  Subjective Chief Complaint: Gwendolyn Ramos presents for a preoperative encounter.   History of Present Illness: Gwendolyn Ramos is a 55 y.o. female who presents for preoperative visit.  She is scheduled to undergo Exam under anesthesia, posterior repair, perineorrhaphy, midurethral sling and cystoscopy on 09/24/20.  Her symptoms include vaginal bulge and incontinence, and she was was found to have Stage I anterior, Stage II posterior, Stage I apical prolapse.  Urodynamic Impression:  1. Sensation was increased; capacity was normal 2. Stress Incontinence was demonstrated at normal pressures; 3. Detrusor Overactivity was not demonstrated. 4. Emptying was normal with a normal PVR, a sustained detrusor contraction present,  abdominal straining not present, normal urethral sphincter activity on EMG.  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   Family History  Problem Relation Age of Onset   Diabetes Mother    COPD Mother    Congestive Heart Failure Mother    Thyroid disease Mother    Hemochromatosis Father    Alcoholism Father    Diabetes Sister    Asthma Sister    Asthma Brother    Diabetes Maternal Aunt    Breast cancer Maternal Aunt    Diabetes Other     Social History   Tobacco Use   Smoking status:  Never   Smokeless tobacco: Never  Vaping Use   Vaping Use: Never used  Substance Use Topics   Alcohol use: Not Currently   Drug use: Never     Review of Systems was negative for a full 10 system review except as noted in the History of Present Illness.   Current Outpatient Medications:    augmented betamethasone dipropionate (DIPROLENE-AF) 0.05 % ointment, Apply topically 2 (two) times daily. (Patient not taking: No sig reported), Disp: , Rfl:    betamethasone dipropionate 0.05 % cream, Apply topically 2 (two) times daily., Disp: , Rfl:    buPROPion (WELLBUTRIN SR) 150 MG 12 hr tablet, Take 150 mg by mouth 2 (two) times daily., Disp: , Rfl:    lidocaine (XYLOCAINE) 5 % ointment, Apply 1 application topically 4 (four) times daily as needed. (Patient not taking: No sig reported), Disp: 1.25 g, Rfl: 0   nystatin-triamcinolone (MYCOLOG II) cream, Apply 1 application topically 2 (two) times daily. Apply to affected area BID for up to 7 days. (Patient not taking: No sig reported), Disp: 60 g, Rfl: 0   omeprazole (PRILOSEC) 20 MG capsule, Take 20 mg by mouth daily., Disp: , Rfl:    tacrolimus (PROTOPIC) 0.1 % ointment, Apply topically. (Patient not taking: No sig reported), Disp: , Rfl:    topiramate (TOPAMAX) 25 MG tablet, Take 3 tablets (75 mg total) by mouth at bedtime., Disp: 270 tablet, Rfl: 1   Ubrogepant (UBRELVY) 100 MG TABS, Take 1 tablet  by mouth as needed (May repeat in 2 hours if needed.  Maximum 2 tablets in 24 hours.)., Disp: 10 tablet, Rfl: 5   valACYclovir (VALTREX) 500 MG tablet, Take 1 tablet (500 mg total) by mouth daily., Disp: 90 tablet, Rfl: 4   vitamin B-12 (CYANOCOBALAMIN) 1000 MCG tablet, Take 1,000 mcg by mouth daily., Disp: , Rfl:    Objective  Gen: No acute distress  Previous Pelvic Exam showed: (04/21/20)   POP-Q   -2                                            Aa   -2                                           Ba   -8.5                                               C    4                                            Gh   3.5                                            Pb   11                                            tvl    0                                            Ap   0                                            Bp   -9.5                                              D         Assessment/ Plan  Assessment: The patient is a 55 y.o. year old scheduled to undergo Exam under anesthesia, posterior repair, perineorrhaphy, midurethral sling and cystoscopy. Verbal consent was obtained for these procedures.  Plan: General Surgical Consent: The patient has previously been counseled on alternative treatments, and the decision by the patient and provider was to proceed with the procedure listed above.  For all procedures, there are risks of bleeding, infection, damage to surrounding organs including but not limited to bowel, bladder, blood  vessels, ureters and nerves, and need for further surgery if an injury were to occur. These risks are all low with minimally invasive surgery.   There are risks of numbness and weakness at any body site or buttock/rectal pain.  It is possible that baseline pain can be worsened by surgery, either with or without mesh. If surgery is vaginal, there is also a low risk of possible conversion to laparoscopy or open abdominal incision where indicated. Very rare risks include blood transfusion, blood clot, heart attack, pneumonia, or death.   There is also a risk of short-term postoperative urinary retention with need to use a catheter. About half of patients need to go home from surgery with a catheter, which is then later removed in the office. The risk of long-term need for a catheter is very low. There is also a risk of worsening of overactive bladder.   Sling: The effectiveness of a midurethral vaginal mesh sling is approximately 85%, and thus, there will be times when you may leak urine after surgery,  especially if your bladder is full or if you have a strong cough. There is a balance between making the sling tight enough to treat your leakage but not too tight so that you have long-term difficulty emptying your bladder. A mesh sling will not directly treat overactive bladder/urge incontinence and may worsen it.  There is an FDA safety notification on vaginal mesh procedures for prolapse but NOT mesh slings. We have extensive experience and training with mesh placement and we have close postoperative follow up to identify any potential complications from mesh. It is important to realize that this mesh is a permanent implant that cannot be easily removed. There are rare risks of mesh exposure (2-4%), pain with intercourse (0-7%), and infection (<1%). The risk of mesh exposure if more likely in a woman with risks for poor healing (prior radiation, poorly controlled diabetes, or immunocompromised). The risk of new or worsened chronic pain after mesh implant is more common in women with baseline chronic pain and/or poorly controlled anxiety or depression. Approximately 2-4% of patients will experience longer-term post-operative voiding dysfunction that may require surgical revision of the sling. We also reviewed that postoperatively, her stream may not be as strong as before surgery.    Prolapse (with or without mesh): Risk factors for surgical failure  include things that put pressure on your pelvis and the surgical repair, including obesity, chronic cough, and heavy lifting or straining (including lifting children or adults, straining on the toilet, or lifting heavy objects such as furniture or anything weighing >25 lbs. Risks of recurrence is 20-30% with vaginal native tissue repair and a less than 10% with sacrocolpopexy with mesh.    We discussed consent for blood products. Risks for blood transfusion include allergic reactions, other reactions that can affect different body organs and managed accordingly,  transmission of infectious diseases such as HIV or Hepatitis. However, the blood is screened. Patient consents for blood products.  Pre-operative instructions:  She was instructed to not take Aspirin/NSAIDs x 7days prior to surgery.  Antibiotic prophylaxis was ordered as indicated.  Cathter use: Patient will go home with foley if needed after post-operative voiding trial.  Post-operative instructions:  She was provided with specific post-operative instructions, including precautions and signs/symptoms for which we would recommend contacting us, in addition to daytime and after-hours contact phone numbers. This was provided on a handout.   Post-operative medications: Prescriptions for motrin, tylenol, miralax, and oxycodone were sent to her pharmacy.  Discussed using ibuprofen and tylenol on a schedule to limit use of narcotics.   Laboratory testing:  We will check labs: CBC and type and screen.   Preoperative clearance:  She does not require surgical clearance.    Post-operative follow-up:  A post-operative appointment will be made for 6 weeks from the date of surgery. If she needs a post-operative nurse visit for a voiding trial, that will be set up after she leaves the hospital.    Patient will call the clinic or use MyChart should anything change or any new issues arise.   Jaquita Folds, MD

## 2020-09-15 NOTE — Patient Instructions (Signed)

## 2020-09-17 ENCOUNTER — Telehealth: Payer: Self-pay

## 2020-09-17 NOTE — Telephone Encounter (Signed)
Gwendolyn Ramos is a 55 y.o. female called in to the dept and LM on the VM for a call back. Pt said she has a sinus infection that is not clearing up. Pt was put on a new antibiotic and because it is a 10 day course it will run into her surgery date.  Pt would like to know if her surgery should e canceled.

## 2020-09-17 NOTE — Telephone Encounter (Signed)
Its fine for her to continue on the antibiotics through her surgery. She does not need to cancel.

## 2020-09-21 ENCOUNTER — Encounter (HOSPITAL_BASED_OUTPATIENT_CLINIC_OR_DEPARTMENT_OTHER): Payer: Self-pay | Admitting: Obstetrics and Gynecology

## 2020-09-21 NOTE — Telephone Encounter (Signed)
Spoke to the pt. She had some concerns and requested to speak to Dr. Wannetta Sender. Pt was transferred

## 2020-10-05 ENCOUNTER — Ambulatory Visit: Payer: BC Managed Care – PPO | Admitting: Oncology

## 2020-10-10 NOTE — H&P (Signed)
[facility] Pre-Operative H&P   Subjective Chief Complaint: Gwendolyn Ramos presents for a preoperative encounter.   History of Present Illness: Gwendolyn Ramos is a 55 y.o. female who presents for preoperative visit.  She is scheduled to undergo Exam under anesthesia, posterior repair, perineorrhaphy, midurethral sling and cystoscopy on 09/24/20.  Her symptoms include vaginal bulge and incontinence, and she was was found to have Stage I anterior, Stage II posterior, Stage I apical prolapse.  Urodynamic Impression:  1. Sensation was increased; capacity was normal 2. Stress Incontinence was demonstrated at normal pressures; 3. Detrusor Overactivity was not demonstrated. 4. Emptying was normal with a normal PVR, a sustained detrusor contraction present,  abdominal straining not present, normal urethral sphincter activity on EMG.  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   Family History  Problem Relation Age of Onset   Diabetes Mother    COPD Mother    Congestive Heart Failure Mother    Thyroid disease Mother    Hemochromatosis Father    Alcoholism Father    Diabetes Sister    Asthma Sister    Asthma Brother    Diabetes  Maternal Aunt    Breast cancer Maternal Aunt    Diabetes Other     Social History   Tobacco Use   Smoking status: Never   Smokeless tobacco: Never  Vaping Use   Vaping Use: Never used  Substance Use Topics   Alcohol use: Not Currently   Drug use: Never     Review of Systems was negative for a full 10 system review except as noted in the History of Present Illness.  No current facility-administered medications for this encounter.  Current Outpatient Medications:    acetaminophen (TYLENOL) 500 MG tablet, Take 1 tablet (500 mg total) by mouth every 6 (six) hours as needed (pain). (Patient not taking: Reported on 09/21/2020), Disp: 30 tablet, Rfl: 0   augmented betamethasone dipropionate (DIPROLENE-AF) 0.05 % ointment, Apply topically 2 (two) times daily., Disp: , Rfl:    betamethasone dipropionate 0.05 % cream, Apply topically 2 (two) times daily., Disp: , Rfl:    buPROPion (WELLBUTRIN SR) 150 MG 12 hr tablet, Take 150 mg by mouth 2 (two) times daily., Disp: , Rfl:    ibuprofen (ADVIL) 600 MG tablet, Take 1 tablet (600 mg total) by mouth every 6 (six) hours as needed. (Patient not taking: Reported on 09/21/2020), Disp: 30 tablet, Rfl: 0   lidocaine (XYLOCAINE) 5 % ointment, Apply 1 application topically 4 (four) times daily as needed., Disp: 1.25 g, Rfl: 0   nystatin-triamcinolone (MYCOLOG II) cream, Apply 1 application topically 2 (two) times daily. Apply to affected area  BID for up to 7 days., Disp: 60 g, Rfl: 0   omeprazole (PRILOSEC) 20 MG capsule, Take 20 mg by mouth daily., Disp: , Rfl:    oxyCODONE (OXY IR/ROXICODONE) 5 MG immediate release tablet, Take 1 tablet (5 mg total) by mouth every 4 (four) hours as needed for severe pain. (Patient not taking: Reported on 09/21/2020), Disp: 10 tablet, Rfl: 0   polyethylene glycol powder (GLYCOLAX/MIRALAX) 17 GM/SCOOP powder, Take 17 g by mouth daily. Drink 17g (1 scoop) dissolved in water per day. (Patient not taking: Reported on 09/21/2020),  Disp: 255 g, Rfl: 0   tacrolimus (PROTOPIC) 0.1 % ointment, Apply topically., Disp: , Rfl:    topiramate (TOPAMAX) 25 MG tablet, Take 3 tablets (75 mg total) by mouth at bedtime., Disp: 270 tablet, Rfl: 1   Ubrogepant (UBRELVY) 100 MG TABS, Take 1 tablet by mouth as needed (May repeat in 2 hours if needed.  Maximum 2 tablets in 24 hours.)., Disp: 10 tablet, Rfl: 5   valACYclovir (VALTREX) 500 MG tablet, Take 1 tablet (500 mg total) by mouth daily., Disp: 90 tablet, Rfl: 4   vitamin B-12 (CYANOCOBALAMIN) 1000 MCG tablet, Take 1,000 mcg by mouth daily., Disp: , Rfl:    Objective  Gen: No acute distress  Previous Pelvic Exam showed: (04/21/20)   POP-Q   -2                                            Aa   -2                                           Ba   -8.5                                              C    4                                            Gh   3.5                                            Pb   11                                            tvl    0                                            Ap   0  Bp   -9.5                                              D         Assessment/ Plan  Assessment: The patient is a 55 y.o. year old with stage II POP and SUI  Plan:  Exam under anesthesia, posterior repair, perineorrhaphy, midurethral sling and cystoscopy.   Jaquita Folds, MD

## 2020-10-21 ENCOUNTER — Other Ambulatory Visit: Payer: Self-pay

## 2020-10-21 ENCOUNTER — Encounter (HOSPITAL_BASED_OUTPATIENT_CLINIC_OR_DEPARTMENT_OTHER): Payer: Self-pay | Admitting: Obstetrics and Gynecology

## 2020-10-21 NOTE — Progress Notes (Signed)
Spoke w/ via phone for pre-op interview---patient Lab needs dos----CBC, type and screen               Lab results------none COVID test -----patient states asymptomatic no test needed Arrive at -------1100 NPO after MN NO Solid Food.  Clear liquids from MN until---1000 Med rec completed Medications to take morning of surgery -----none (Patient states that she takes all her medications in the evening.) Diabetic medication -----n/a Patient instructed no nail polish to be worn day of surgery Patient instructed to bring photo id and insurance card day of surgery Patient aware to have Driver (ride ) / caregiver    for 24 hours after surgery - husband Gwendolyn Ramos Patient Special Instructions -----n/a Pre-Op special Istructions -----n/a Patient verbalized understanding of instructions that were given at this phone interview. Patient denies shortness of breath, chest pain, fever, cough at this phone interview.

## 2020-10-26 ENCOUNTER — Ambulatory Visit (HOSPITAL_BASED_OUTPATIENT_CLINIC_OR_DEPARTMENT_OTHER)
Admission: RE | Admit: 2020-10-26 | Discharge: 2020-10-26 | Disposition: A | Discharge status: Home / Self Care | Payer: BC Managed Care – PPO | Attending: Obstetrics and Gynecology | Admitting: Obstetrics and Gynecology

## 2020-10-26 ENCOUNTER — Encounter (HOSPITAL_BASED_OUTPATIENT_CLINIC_OR_DEPARTMENT_OTHER): Payer: Self-pay | Admitting: Obstetrics and Gynecology

## 2020-10-26 ENCOUNTER — Ambulatory Visit (HOSPITAL_BASED_OUTPATIENT_CLINIC_OR_DEPARTMENT_OTHER): Payer: BC Managed Care – PPO | Admitting: Anesthesiology

## 2020-10-26 ENCOUNTER — Encounter: Payer: Self-pay | Admitting: Obstetrics and Gynecology

## 2020-10-26 ENCOUNTER — Telehealth: Payer: Self-pay | Admitting: Obstetrics and Gynecology

## 2020-10-26 ENCOUNTER — Encounter (HOSPITAL_BASED_OUTPATIENT_CLINIC_OR_DEPARTMENT_OTHER)
Admission: RE | Disposition: A | Discharge status: Home / Self Care | Payer: Self-pay | Source: Home / Self Care | Attending: Obstetrics and Gynecology

## 2020-10-26 ENCOUNTER — Other Ambulatory Visit: Payer: Self-pay

## 2020-10-26 DIAGNOSIS — Z833 Family history of diabetes mellitus: Secondary | ICD-10-CM | POA: Insufficient documentation

## 2020-10-26 DIAGNOSIS — Z8249 Family history of ischemic heart disease and other diseases of the circulatory system: Secondary | ICD-10-CM | POA: Insufficient documentation

## 2020-10-26 DIAGNOSIS — Z803 Family history of malignant neoplasm of breast: Secondary | ICD-10-CM | POA: Insufficient documentation

## 2020-10-26 DIAGNOSIS — N393 Stress incontinence (female) (male): Secondary | ICD-10-CM | POA: Diagnosis not present

## 2020-10-26 DIAGNOSIS — Z79899 Other long term (current) drug therapy: Secondary | ICD-10-CM | POA: Insufficient documentation

## 2020-10-26 DIAGNOSIS — N813 Complete uterovaginal prolapse: Secondary | ICD-10-CM | POA: Diagnosis not present

## 2020-10-26 DIAGNOSIS — N816 Rectocele: Secondary | ICD-10-CM

## 2020-10-26 DIAGNOSIS — Z825 Family history of asthma and other chronic lower respiratory diseases: Secondary | ICD-10-CM | POA: Insufficient documentation

## 2020-10-26 DIAGNOSIS — Z791 Long term (current) use of non-steroidal anti-inflammatories (NSAID): Secondary | ICD-10-CM | POA: Diagnosis not present

## 2020-10-26 DIAGNOSIS — Z8349 Family history of other endocrine, nutritional and metabolic diseases: Secondary | ICD-10-CM | POA: Diagnosis not present

## 2020-10-26 HISTORY — DX: Personal history of other infectious and parasitic diseases: Z86.19

## 2020-10-26 HISTORY — DX: Migraine, unspecified, not intractable, without status migrainosus: G43.909

## 2020-10-26 HISTORY — DX: Dermatitis, unspecified: L30.9

## 2020-10-26 HISTORY — PX: BLADDER SUSPENSION: SHX72

## 2020-10-26 HISTORY — DX: Rectocele: N81.6

## 2020-10-26 HISTORY — PX: RECTOCELE REPAIR: SHX761

## 2020-10-26 HISTORY — PX: CYSTOSCOPY: SHX5120

## 2020-10-26 LAB — CBC
HCT: 42.3 % (ref 36.0–46.0)
Hemoglobin: 14.2 g/dL (ref 12.0–15.0)
MCH: 32.1 pg (ref 26.0–34.0)
MCHC: 33.6 g/dL (ref 30.0–36.0)
MCV: 95.5 fL (ref 80.0–100.0)
Platelets: 313 10*3/uL (ref 150–400)
RBC: 4.43 MIL/uL (ref 3.87–5.11)
RDW: 14.1 % (ref 11.5–15.5)
WBC: 9.7 10*3/uL (ref 4.0–10.5)
nRBC: 0 % (ref 0.0–0.2)

## 2020-10-26 LAB — TYPE AND SCREEN
ABO/RH(D): B POS
Antibody Screen: NEGATIVE

## 2020-10-26 LAB — ABO/RH: ABO/RH(D): B POS

## 2020-10-26 SURGERY — COLPORRHAPHY, POSTERIOR, FOR RECTOCELE REPAIR
Anesthesia: General | Site: Vagina

## 2020-10-26 MED ORDER — LIDOCAINE 2% (20 MG/ML) 5 ML SYRINGE
INTRAMUSCULAR | Status: AC
  Filled 2020-10-26: qty 5

## 2020-10-26 MED ORDER — FENTANYL CITRATE (PF) 250 MCG/5ML IJ SOLN
INTRAMUSCULAR | Status: AC
  Filled 2020-10-26: qty 5

## 2020-10-26 MED ORDER — PROPOFOL 10 MG/ML IV BOLUS
INTRAVENOUS | Status: DC | PRN
  Administered 2020-10-26: 30 mg via INTRAVENOUS
  Administered 2020-10-26: 170 mg via INTRAVENOUS

## 2020-10-26 MED ORDER — MIDAZOLAM HCL 2 MG/2ML IJ SOLN
INTRAMUSCULAR | Status: AC
  Filled 2020-10-26: qty 2

## 2020-10-26 MED ORDER — MIDAZOLAM HCL 2 MG/2ML IJ SOLN
INTRAMUSCULAR | Status: DC | PRN
  Administered 2020-10-26: 2 mg via INTRAVENOUS

## 2020-10-26 MED ORDER — POVIDONE-IODINE 10 % EX SWAB: 2.0000 "application " | Freq: Once | CUTANEOUS | Status: DC

## 2020-10-26 MED ORDER — LACTATED RINGERS IV SOLN: INTRAVENOUS | Status: DC

## 2020-10-26 MED ORDER — FENTANYL CITRATE (PF) 100 MCG/2ML IJ SOLN
INTRAMUSCULAR | Status: DC | PRN
  Administered 2020-10-26 (×3): 50 ug via INTRAVENOUS

## 2020-10-26 MED ORDER — DEXAMETHASONE SODIUM PHOSPHATE 10 MG/ML IJ SOLN
INTRAMUSCULAR | Status: AC
  Filled 2020-10-26: qty 1

## 2020-10-26 MED ORDER — PROPOFOL 10 MG/ML IV BOLUS
INTRAVENOUS | Status: AC
  Filled 2020-10-26: qty 20

## 2020-10-26 MED ORDER — ONDANSETRON HCL 4 MG/2ML IJ SOLN
INTRAMUSCULAR | Status: AC
  Filled 2020-10-26: qty 2

## 2020-10-26 MED ORDER — ONDANSETRON HCL 4 MG/2ML IJ SOLN
INTRAMUSCULAR | Status: DC | PRN
  Administered 2020-10-26: 4 mg via INTRAVENOUS

## 2020-10-26 MED ORDER — OXYCODONE HCL 5 MG PO TABS
ORAL_TABLET | ORAL | Status: AC
  Filled 2020-10-26: qty 1

## 2020-10-26 MED ORDER — PHENAZOPYRIDINE HCL 100 MG PO TABS
200.0000 mg | ORAL_TABLET | ORAL | Status: AC
Start: 2020-10-26 — End: 2020-10-26
  Administered 2020-10-26: 200 mg via ORAL

## 2020-10-26 MED ORDER — DEXAMETHASONE SODIUM PHOSPHATE 10 MG/ML IJ SOLN
INTRAMUSCULAR | Status: DC | PRN
Start: 2020-10-26 — End: 2020-10-26
  Administered 2020-10-26: 5 mg via INTRAVENOUS

## 2020-10-26 MED ORDER — LIDOCAINE 2% (20 MG/ML) 5 ML SYRINGE
INTRAMUSCULAR | Status: DC | PRN
  Administered 2020-10-26: 80 mg via INTRAVENOUS

## 2020-10-26 MED ORDER — CEFAZOLIN SODIUM-DEXTROSE 2-4 GM/100ML-% IV SOLN
INTRAVENOUS | Status: AC
  Filled 2020-10-26: qty 100

## 2020-10-26 MED ORDER — PHENAZOPYRIDINE HCL 100 MG PO TABS
ORAL_TABLET | ORAL | Status: AC
  Filled 2020-10-26: qty 2

## 2020-10-26 MED ORDER — OXYCODONE HCL 5 MG PO TABS
5.0000 mg | ORAL_TABLET | Freq: Once | ORAL | Status: AC
  Administered 2020-10-26: 5 mg via ORAL

## 2020-10-26 MED ORDER — LIDOCAINE-EPINEPHRINE 1 %-1:100000 IJ SOLN
INTRAMUSCULAR | Status: DC | PRN
  Administered 2020-10-26: 20 mL

## 2020-10-26 MED ORDER — CEFAZOLIN SODIUM-DEXTROSE 2-4 GM/100ML-% IV SOLN
2.0000 g | INTRAVENOUS | Status: AC
Start: 2020-10-27 — End: 2020-10-26
  Administered 2020-10-26: 2 g via INTRAVENOUS

## 2020-10-26 SURGICAL SUPPLY — 45 items
ADH SKN CLS APL DERMABOND .7 (GAUZE/BANDAGES/DRESSINGS) ×2
AGENT HMST KT MTR STRL THRMB (HEMOSTASIS)
APL ESCP 34 STRL LF DISP (HEMOSTASIS)
APPLICATOR SURGIFLO ENDO (HEMOSTASIS) IMPLANT
BLADE SURG 15 STRL LF DISP TIS (BLADE) ×2 IMPLANT
BLADE SURG 15 STRL SS (BLADE) ×3
CATH ROBINSON RED A/P 14FR (CATHETERS) ×3 IMPLANT
DECANTER SPIKE VIAL GLASS SM (MISCELLANEOUS) ×3 IMPLANT
DERMABOND ADVANCED (GAUZE/BANDAGES/DRESSINGS) ×1
DERMABOND ADVANCED .7 DNX12 (GAUZE/BANDAGES/DRESSINGS) ×2 IMPLANT
DEVICE CAPIO SLIM SINGLE (INSTRUMENTS) IMPLANT
ELECT REM PT RETURN 9FT ADLT (ELECTROSURGICAL) ×3
ELECTRODE REM PT RTRN 9FT ADLT (ELECTROSURGICAL) ×2 IMPLANT
GAUZE 4X4 16PLY ~~LOC~~+RFID DBL (SPONGE) ×3 IMPLANT
GLOVE SURG ENC MOIS LTX SZ6 (GLOVE) ×3 IMPLANT
GLOVE SURG UNDER POLY LF SZ6.5 (GLOVE) ×3 IMPLANT
GOWN STRL REUS W/TWL LRG LVL3 (GOWN DISPOSABLE) ×3 IMPLANT
HIBICLENS CHG 4% 4OZ (MISCELLANEOUS) ×3 IMPLANT
KIT TURNOVER CYSTO (KITS) ×3 IMPLANT
MANIFOLD NEPTUNE II (INSTRUMENTS) ×3 IMPLANT
NEEDLE HYPO 22GX1.5 SAFETY (NEEDLE) ×3 IMPLANT
NEEDLE MAYO 6 CRC TAPER PT (NEEDLE) IMPLANT
NS IRRIG 1000ML POUR BTL (IV SOLUTION) ×3 IMPLANT
PACK CYSTO (CUSTOM PROCEDURE TRAY) IMPLANT
PACK VAGINAL MINOR WOMEN LF (CUSTOM PROCEDURE TRAY) ×3 IMPLANT
PACK VAGINAL WOMENS (CUSTOM PROCEDURE TRAY) ×3 IMPLANT
RETRACTOR LONE STAR DISPOSABLE (INSTRUMENTS) ×3 IMPLANT
RETRACTOR STAY HOOK 5MM (MISCELLANEOUS) ×3 IMPLANT
SET IRRIG Y TYPE TUR BLADDER L (SET/KITS/TRAYS/PACK) ×3 IMPLANT
SUCTION FRAZIER HANDLE 10FR (MISCELLANEOUS) ×1
SUCTION TUBE FRAZIER 10FR DISP (MISCELLANEOUS) ×2 IMPLANT
SURGIFLO W/THROMBIN 8M KIT (HEMOSTASIS) IMPLANT
SUT ABS MONO DBL WITH NDL 48IN (SUTURE) IMPLANT
SUT MON AB 2-0 SH 27 (SUTURE) ×3 IMPLANT
SUT VIC AB 0 CT1 27 (SUTURE) ×9
SUT VIC AB 0 CT1 27XBRD ANTBC (SUTURE) ×6 IMPLANT
SUT VIC AB 2-0 SH 27 (SUTURE)
SUT VIC AB 2-0 SH 27XBRD (SUTURE) IMPLANT
SUT VICRYL 2-0 SH 8X27 (SUTURE) ×3 IMPLANT
SYR BULB EAR ULCER 3OZ GRN STR (SYRINGE) ×3 IMPLANT
SYS SLING ADV FIT BLUE TRNSVAG (Sling) ×3 IMPLANT
TOWEL OR 17X26 10 PK STRL BLUE (TOWEL DISPOSABLE) ×3 IMPLANT
TRAY FOLEY W/BAG SLVR 14FR (SET/KITS/TRAYS/PACK) ×3 IMPLANT
TRAY FOLEY W/BAG SLVR 14FR LF (SET/KITS/TRAYS/PACK) ×3 IMPLANT
TUBING TUR DISP (UROLOGICAL SUPPLIES) ×3 IMPLANT

## 2020-10-26 NOTE — Interval H&P Note (Signed)
History and Physical Interval Note:  10/26/2020 1:55 PM  Gwendolyn Ramos  has presented today for surgery, with the diagnosis of posterior vaginal prolapse; stress incontinence.  The various methods of treatment have been discussed with the patient and family. After consideration of risks, benefits and other options for treatment, the patient has consented to  Procedure(s) with comments: POSTERIOR REPAIR (RECTOCELE) with perineorrhaphy (N/A) TRANSVAGINAL TAPE (TVT) PROCEDURE (N/A) CYSTOSCOPY (N/A) as a surgical intervention.   Vitals:   10/26/20 1212  BP: (!) 145/95  Pulse: 67  Resp: 14  Temp: 98.6 F (37 C)  SpO2: 100%    Gen: NAD CV: S1 S2 RRR Lungs: Clear to auscultation bilaterally Abd: soft, nontender   The patient's history has been reviewed, patient examined, no change in status, stable for surgery.  I have reviewed the patient's chart and labs.  Questions were answered to the patient's satisfaction.     Jaquita Folds

## 2020-10-26 NOTE — Discharge Instructions (Addendum)
POST OPERATIVE INSTRUCTIONS  General Instructions Recovery (not bed rest) will last approximately 6 weeks Walking is encouraged, but refrain from strenuous exercise/ housework/ heavy lifting. No lifting >10lbs  Nothing in the vagina- NO intercourse, tampons or douching Bathing:  Do not submerge in water (NO swimming, bath, hot tub, etc) until after your postop visit. You can shower starting the day after surgery.  No driving until you are not taking narcotic pain medicine and until your pain is well enough controlled that you can slam on the breaks or make sudden movements if needed.   Taking your medications Please take your acetaminophen and ibuprofen on a schedule for the first 48 hours. Take 600mg ibuprofen, then take 500mg acetaminophen 3 hours later, then continue to alternate ibuprofen and acetaminophen. That way you are taking each type of medication every 6 hours. Take the prescribed narcotic (oxycodone, tramadol, etc) as needed, with a maximum being every 4 hours.  Take a stool softener daily to keep your stools soft and preventing you from straining. If you have diarrhea, you decrease your stool softener. This is explained more below. We have prescribed you Miralax.  Reasons to Call the Nurse (see last page for phone numbers) Heavy Bleeding (changing your pad every 1-2 hours) Persistent nausea/vomiting Fever (100.4 degrees or more) Incision problems (pus or other fluid coming out, redness, warmth, increased pain)  Things to Expect After Surgery Mild to Moderate pain is normal during the first day or two after surgery. If prescribed, take Ibuprofen or Tylenol first and use the stronger medicine for "break-through" pain. You can overlap these medicines because they work differently.   Constipation   To Prevent Constipation:  Eat a well-balanced diet including protein, grains, fresh fruit and vegetables.  Drink plenty of fluids. Walk regularly.  Depending on specific instructions  from your physician: take Miralax daily and additionally you can add a stool softener (colace/ docusate) and fiber supplement. Continue as long as you're on pain medications.   To Treat Constipation:  If you do not have a bowel movement in 2 days after surgery, you can take 2 Tbs of Milk of Magnesia 1-2 times a day until you have a bowel movement. If diarrhea occurs, decrease the amount or stop the laxative. If no results with Milk of Magnesia, you can drink a bottle of magnesium citrate which you can purchase over the counter.  Fatigue:  This is a normal response to surgery and will improve with time.  Plan frequent rest periods throughout the day.  Gas Pain:  This is very common but can also be very painful! Drink warm liquids such as herbal teas, bouillon or soup. Walking will help you pass more gas.  Mylicon or Gas-X can be taken over the counter.  Leaking Urine:  Varying amounts of leakage may occur after surgery.  This should improve with time. Your bladder needs at least 3 months to recover from surgery. If you leak after surgery, be sure to mention this to your doctor at your post-op visit. If you were taking medications for overactive bladder prior to surgery, be sure to restart the medications immediately after surgery.  Incisions: If you have incisions on your abdomen, the skin glue will dissolve on its own over time. It is ok to gently rinse with soap and water over these incisions but do not scrub.  Catheter Approximately 50% of patients are unable to urinate after surgery and need to go home with a catheter. This allows your bladder to   rest so it can return to full function. If you go home with a catheter, the office will call to set up a voiding trial a few days after surgery. For most patients, by this visit, they are able to urinate on their own. Long term catheter use is rare.   Return to Work  As work demands and recovery times vary widely, it is hard to predict when you will want  to return to work. If you have a desk job with no strenuous physical activity, and if you would like to return sooner than generally recommended, discuss this with your provider or call our office.   Post op concerns  For non-emergent issues, please call the Urogynecology Nurse. Please leave a message and someone will contact you within one business day.  You can also send a message through MyChart.   AFTER HOURS (After 5:00 PM and on weekends):  For urgent matters that cannot wait until the next business day. Call our office [phone] and connect to the doctor on call.  Please reserve this for important issues.   **FOR ANY TRUE EMERGENCY ISSUES CALL 911 OR GO TO THE NEAREST EMERGENCY ROOM.** Please inform our office or the doctor on call of any emergency.     APPOINTMENTS: Call [phone]  Post Anesthesia Home Care Instructions  Activity: Get plenty of rest for the remainder of the day. A responsible adult should stay with you for 24 hours following the procedure.  For the next 24 hours, DO NOT: -Drive a car -Operate machinery -Drink alcoholic beverages -Take any medication unless instructed by your physician -Make any legal decisions or sign important papers.  Meals: Start with liquid foods such as gelatin or soup. Progress to regular foods as tolerated. Avoid greasy, spicy, heavy foods. If nausea and/or vomiting occur, drink only clear liquids until the nausea and/or vomiting subsides. Call your physician if vomiting continues.  Special Instructions/Symptoms: Your throat may feel dry or sore from the anesthesia or the breathing tube placed in your throat during surgery. If this causes discomfort, gargle with warm salt water. The discomfort should disappear within 24 hours.  If you had a scopolamine patch placed behind your ear for the management of post- operative nausea and/or vomiting:  1. The medication in the patch is effective for 72 hours, after which it should be  removed.  Wrap patch in a tissue and discard in the trash. Wash hands thoroughly with soap and water. 2. You may remove the patch earlier than 72 hours if you experience unpleasant side effects which may include dry mouth, dizziness or visual disturbances. 3. Avoid touching the patch. Wash your hands with soap and water after contact with the patch.   

## 2020-10-26 NOTE — Anesthesia Procedure Notes (Signed)
Procedure Name: LMA Insertion Date/Time: 10/26/2020 2:18 PM Performed by: Lollie Sails, CRNA Pre-anesthesia Checklist: Patient identified, Emergency Drugs available, Suction available, Patient being monitored and Timeout performed Patient Re-evaluated:Patient Re-evaluated prior to induction Oxygen Delivery Method: Circle system utilized Preoxygenation: Pre-oxygenation with 100% oxygen Induction Type: IV induction Ventilation: Mask ventilation without difficulty LMA: LMA inserted LMA Size: 4.0 Number of attempts: 1 Placement Confirmation: positive ETCO2 and breath sounds checked- equal and bilateral Tube secured with: Tape Dental Injury: Teeth and Oropharynx as per pre-operative assessment

## 2020-10-26 NOTE — Transfer of Care (Signed)
Immediate Anesthesia Transfer of Care Note  Patient: Rita Vialpando [facility]  Procedure(s) Performed: POSTERIOR REPAIR (RECTOCELE) with perineorrhaphy (Vagina ) TRANSVAGINAL TAPE (TVT) PROCEDURE (Vagina ) CYSTOSCOPY (Bladder)  Patient Location: PACU  Anesthesia Type:General  Level of Consciousness: awake, drowsy and responds to stimulation  Airway & Oxygen Therapy: Patient Spontanous Breathing and Patient connected to nasal cannula oxygen  Post-op Assessment: Report given to RN and Post -op Vital signs reviewed and stable  Post vital signs: Reviewed and stable  Last Vitals:  Vitals Value Taken Time  BP    Temp    Pulse 88 10/26/20 1531  Resp 20 10/26/20 1531  SpO2 97 % 10/26/20 1531  Vitals shown include unvalidated device data.  Last Pain:  Vitals:   10/26/20 1212  TempSrc: Oral         Complications: No notable events documented.

## 2020-10-26 NOTE — Anesthesia Preprocedure Evaluation (Addendum)
Anesthesia Evaluation  Patient identified by MRN, date of birth, ID band Patient awake    Reviewed: Allergy & Precautions, H&P , NPO status , Patient's Chart, lab work & pertinent test results, reviewed documented beta blocker date and time   Airway Mallampati: I  TM Distance: >3 FB Neck ROM: full    Dental no notable dental hx. (+) Teeth Intact, Dental Advisory Given, Implants   Pulmonary neg pulmonary ROS,    Pulmonary exam normal breath sounds clear to auscultation       Cardiovascular Exercise Tolerance: Good negative cardio ROS   Rhythm:regular Rate:Normal     Neuro/Psych  Headaches, PSYCHIATRIC DISORDERS Anxiety Depression    GI/Hepatic Neg liver ROS, GERD  Medicated,  Endo/Other  negative endocrine ROS  Renal/GU negative Renal ROS  negative genitourinary   Musculoskeletal   Abdominal   Peds  Hematology negative hematology ROS (+)   Anesthesia Other Findings   Reproductive/Obstetrics negative OB ROS                            Anesthesia Physical Anesthesia Plan  ASA: 2  Anesthesia Plan: General   Post-op Pain Management:    Induction: Intravenous  PONV Risk Score and Plan: 3 and Ondansetron, Dexamethasone and Treatment may vary due to age or medical condition  Airway Management Planned: Oral ETT and LMA  Additional Equipment: None  Intra-op Plan:   Post-operative Plan: Extubation in OR  Informed Consent: I have reviewed the patients History and Physical, chart, labs and discussed the procedure including the risks, benefits and alternatives for the proposed anesthesia with the patient or authorized representative who has indicated his/her understanding and acceptance.     Dental Advisory Given  Plan Discussed with: CRNA and Anesthesiologist  Anesthesia Plan Comments: (  )       Anesthesia Quick Evaluation

## 2020-10-26 NOTE — Op Note (Signed)
Operative Note  Preoperative Diagnosis: posterior vaginal prolapse and stress urinary incontinence  Postoperative Diagnosis: same  Procedures performed:  Posterior repair and perineorrhaphy, midurethral sling and cystoscopy  Implants:  Implant Name Type Inv. Item Serial No. Manufacturer Lot No. LRB No. Used Action  SYS SLING ADV FIT BLUE TRNSVAG - [id number] Sling SYS SLING ADV FIT BLUE TRNSVAG  [place] [id number] N/A 1 Implanted    Attending Surgeon: Sherlene Shams, MD  Anesthesia: General LMA  Findings: 1. Stage III Pelvic organ prolapse on vaginal exam  2. On cystoscopy, normal bladder and urethra and brisk ureteral efflux present.    Specimens: * No specimens in log *  Estimated blood loss: 100 mL  IV fluids: 720 mL  Urine output: 557 mL  Complications: none  Procedure in Detail:  After informed consent was obtained, the patient was taken to the operating room where she was placed under anesthesia.  She was then placed in the dorsal lithotomy position with allen stirrups and prepped and draped in the usual sterile fashion.  A strap was placed across her upper abdomen on top of her gown so it was not in direct contact with her skin.  Care was taken to avoid hyperflexion or hyperextension of her upper and lower extremities. A foley catheter was placed.  A lonestar self-retraining retractor was placed with 4 stay hooks. The mid urethral area was located on the anterior vaginal wall.  Two Allis clamps were placed at the level of the midurethra. 1% lidocaine with epinephrine was injected into the vaginal mucosa. A vertical incision was made between the two clamps using a 15-blade scalpel.  Using sharp dissection, Metzenbaum scissors were used to make a periurethral tunnel from the vaginal incision towards the pubic rami bilaterally for the future sling tracts. The bladder was ensured to be empty. The trocar and attached sling were introduced into the right side of the  periurethral vaginal incision, just inferior to the pubic symphysis on the right side. The trocar was guided through the endopelvic fascia and directly vertically.  While hugging the cephalad surface of the pubic bone, the trocar was guided out through the abdomen 2 fingerbreadths lateral to midline at the level of the pubic symphysis on the ipsilateral side. The trocar was placed on the left side in a similar fashion.  A 70-degree cystoscope was introduced, and 360-degree inspection revealed no trauma or trocars in the bladder, with brisk bilateral ureteral efflux.  The bladder was drained and the cystoscope was removed.  The Foley catheter was reinserted.  The sling was brought to lie beneath the mid-urethra.  A needle driver was placed behind the sling to ensure no tension.   The plastic sheath was removed from the sling and the distal ends of the sling were trimmed just below the level of the skin incisions. Hemostasis was noted.  Tension-free positioning of the sling was confirmed. Vaginal inspection revealed no vaginotomy or sling perforations of the mucosa. The vaginal mucosal edges were reapproximated using 2-0 Vicryl.     Attention was then turned to the posterior vagina.  Two Allis clamps were placed in the midline of the posterior vaginal wall defect.  1% lidocaine with epinephrine was injected into the vaginal mucosa. A vertical incision was made between these clamps with a 15 blade scalpel and a diamond shape was made over the perineal body encompassing prior scar.  The rectovaginal septum was then dissected off the vaginal mucosa bilaterally.  No enterocele was noted.  The  rectovaginal septum was then reapproximated with plicating sutures of 2-0 Vicryl.  After placement of the first plication stitch two fingers were inserted into the vaginal to confirm adequate caliber.  The last distal stitch incorporated the perineal body in a U stitch fashion.  After plication, the excess vaginal mucosa was  trimmed and the vaginal mucosa was reapproximated using 2-0 Vicryl sutures in a running fashion. The perineal body was reapproximated with 0-vicryl interrupted suture. The perineal skin was then closed with 2-0 vicryl.  The vagina was copiously irrigated and hemostasis was noted.  Vaginal packing was not placed.  A rectal examination was normal and confirmed no sutures within the rectum.  The patient tolerated the procedure well.  She was awakened from anesthesia and transferred to the recovery room in stable condition. All counts were correct x 2.    Jaquita Folds, MD

## 2020-10-26 NOTE — Anesthesia Postprocedure Evaluation (Signed)
Anesthesia Post Note  Patient: Gwendolyn Ramos [facility]  Procedure(s) Performed: POSTERIOR REPAIR (RECTOCELE) with perineorrhaphy (Vagina ) TRANSVAGINAL TAPE (TVT) PROCEDURE (Vagina ) CYSTOSCOPY (Bladder)     Patient location during evaluation: PACU Anesthesia Type: General Level of consciousness: awake and alert Pain management: pain level controlled Vital Signs Assessment: post-procedure vital signs reviewed and stable Respiratory status: spontaneous breathing, nonlabored ventilation, respiratory function stable and patient connected to nasal cannula oxygen Cardiovascular status: blood pressure returned to baseline and stable Postop Assessment: no apparent nausea or vomiting Anesthetic complications: no   No notable events documented.  Last Vitals:  Vitals:   10/26/20 1212  BP: (!) 145/95  Pulse: 67  Resp: 14  Temp: 37 C  SpO2: 100%    Last Pain:  Vitals:   10/26/20 1212  TempSrc: Oral                 Topanga Alvelo

## 2020-10-26 NOTE — Telephone Encounter (Signed)
Gwendolyn Ramos Gwendolyn Ramos underwent posterior repair, perineorrhaphy, midurethral sling and cystoscopy on 10/26/20.   Gwendolyn Ramos passed her voiding trial.  366ml was backfilled into the bladder Voided 224ml  PVR by bladder scan was 20ml.   Gwendolyn Ramos was discharged without a catheter. Please call her for a routine post op check. Thanks!  Gwendolyn Folds, MD

## 2020-10-27 ENCOUNTER — Encounter (HOSPITAL_BASED_OUTPATIENT_CLINIC_OR_DEPARTMENT_OTHER): Payer: Self-pay | Admitting: Obstetrics and Gynecology

## 2020-10-27 ENCOUNTER — Encounter: Payer: Self-pay | Admitting: *Deleted

## 2020-10-27 NOTE — Telephone Encounter (Signed)
Post- Op Call  Gwendolyn Ramos underwent posterior repair, perineorrhaphy, midurethral sling and cystoscopy on 10/26/20 with Dr Wannetta Sender. The patient reports that her pain is controlled. She is taking ibuprofen and has taken an oxycodone. She reports vaginal bleeding that is minimal. She has not had a bowel movement and is taking miralax for a bowel regimen. She was discharged without a catheter and is voiding without problems. Advised to call with any concerns.                            Blenda Nicely, RN

## 2020-10-29 ENCOUNTER — Other Ambulatory Visit: Payer: Self-pay | Admitting: Neurology

## 2020-11-06 ENCOUNTER — Telehealth: Payer: Self-pay | Admitting: Oncology

## 2020-11-06 ENCOUNTER — Telehealth: Payer: Self-pay | Admitting: Obstetrics and Gynecology

## 2020-11-06 NOTE — Telephone Encounter (Signed)
Pt called stating that she has started feeling some discomfort on the inner right side of vaginal vault. She states that she did have a large bowel movement, it was not hard in consistent, but a large volume. She noticed some bleeding from the vagina after that. Pt states that she did not strain when having bowel movement. She describes the bleeding as spotting now. Advised that spotting can be normal but if she starts to actively bleed to seek medical attention or call after hours line. Advised that if discomfort does not improve over the weekend to let us know Monday and we can see about getting her in for evaluation. Pt verbalized understanding.

## 2020-11-06 NOTE — Telephone Encounter (Signed)
Scheduled per 10/14 in basket, pt has been called and confirmed appt 

## 2020-11-11 ENCOUNTER — Encounter: Payer: BC Managed Care – PPO | Admitting: Obstetrics and Gynecology

## 2020-11-20 ENCOUNTER — Ambulatory Visit (INDEPENDENT_AMBULATORY_CARE_PROVIDER_SITE_OTHER): Payer: BC Managed Care – PPO | Admitting: Obstetrics and Gynecology

## 2020-11-20 ENCOUNTER — Other Ambulatory Visit: Payer: Self-pay

## 2020-11-20 ENCOUNTER — Encounter: Payer: Self-pay | Admitting: Obstetrics and Gynecology

## 2020-11-20 VITALS — BP 119/84 | HR 80 | Wt 210.0 lb

## 2020-11-20 DIAGNOSIS — Z9889 Other specified postprocedural states: Secondary | ICD-10-CM

## 2020-11-20 NOTE — Progress Notes (Signed)
[facility]  Date of Visit: 11/20/2020  History of Present Illness: Gwendolyn Ramos is a 55 y.o. female scheduled today for a post-operative visit.   Surgery: s/p Posterior repair and perineorrhaphy, midurethral sling and cystoscopy on 10/26/20  She passed her postoperative void trial.   Today she reports she has noticed intermittent bright red blood with wiping. Most of the time it is only a small amount, there was one episode where she had a clot.    Medications: She has a current medication list which includes the following prescription(s): augmented betamethasone dipropionate, betamethasone dipropionate, bupropion, omeprazole, topiramate, ubrelvy, valacyclovir, vitamin b-12, acetaminophen, ibuprofen, oxycodone, and polyethylene glycol powder.   Allergies: Patient is allergic to levaquin [levofloxacin] and methylchloroisothiazolinone [methylisothiazolinone].   Physical Exam: BP 119/84   Pulse 80   Wt 210 lb (95.3 kg)   LMP  (LMP Unknown) Comment: patient had spotting in October 2020  BMI 31.01 kg/m    Pelvic Examination:  Vagina: Suture at introitus not intact, superficial wound separation present. No active bleeding. Sutures are present at incision line on the posterior vaginal and suburethrally. No visible or palpable mesh.  ---------------------------------------------------------  Assessment and Plan:  1. Post-operative state     - small wound separation at the introitus, no active bleeding present - Advised to use water bottle after using the bathroom instead of toilet paper to decrease irritation.  - Continue to limit activity until healing complete  Follow up as scheduled in 3 weeks  Jaquita Folds, MD

## 2020-11-24 ENCOUNTER — Encounter: Payer: Self-pay | Admitting: Obstetrics and Gynecology

## 2020-11-24 ENCOUNTER — Telehealth: Payer: Self-pay | Admitting: Obstetrics and Gynecology

## 2020-11-24 NOTE — Telephone Encounter (Signed)
Ms Gwendolyn Ramos called regarding vaginal bleeding that has increased today. Noticed it around the urethra area. Has some spotting near the perineum which has improved since last week. Also was having some mild pressure in the pelvis, maybe like a urinary tract infection. She sent a picture through mychart of her pad.   Checked pad currently while on the phone and only small amount of blood is present. Advised her to monitor and to notify if symptoms of bleeding are getting worse, or if urinary symptoms worsen.   Jaquita Folds, MD

## 2020-11-25 ENCOUNTER — Other Ambulatory Visit: Payer: Self-pay

## 2020-11-25 ENCOUNTER — Ambulatory Visit (INDEPENDENT_AMBULATORY_CARE_PROVIDER_SITE_OTHER): Payer: BC Managed Care – PPO | Admitting: Obstetrics and Gynecology

## 2020-11-25 ENCOUNTER — Encounter: Payer: Self-pay | Admitting: Obstetrics and Gynecology

## 2020-11-25 VITALS — BP 146/93 | HR 83 | Wt 210.0 lb

## 2020-11-25 DIAGNOSIS — R350 Frequency of micturition: Secondary | ICD-10-CM

## 2020-11-25 DIAGNOSIS — N952 Postmenopausal atrophic vaginitis: Secondary | ICD-10-CM

## 2020-11-25 DIAGNOSIS — R3915 Urgency of urination: Secondary | ICD-10-CM

## 2020-11-25 LAB — POCT URINALYSIS DIPSTICK
Appearance: ABNORMAL
Bilirubin, UA: NEGATIVE
Glucose, UA: NEGATIVE
Ketones, UA: NEGATIVE
Nitrite, UA: NEGATIVE
Protein, UA: NEGATIVE
Spec Grav, UA: 1.015 (ref 1.010–1.025)
Urobilinogen, UA: 0.2 E.U./dL
pH, UA: 6 (ref 5.0–8.0)

## 2020-11-25 MED ORDER — ESTRADIOL 0.1 MG/GM VA CREA: TOPICAL_CREAM | VAGINAL | 11 refills | Status: DC

## 2020-11-25 NOTE — Progress Notes (Signed)
[facility]  Date of Visit: 11/25/2020  History of Present Illness: Gwendolyn Ramos is a 55 y.o. female scheduled today for a post-operative visit.   Surgery: s/p Posterior repair and perineorrhaphy, midurethral sling and cystoscopy on 10/26/20  She passed her postoperative void trial.   Continues to have bright red bleeding. Has been limiting her activity and it is still there. Does not seem to be coming from the perineal area, but higher up.   Medications: She has a current medication list which includes the following prescription(s): estradiol, acetaminophen, betamethasone dipropionate, bupropion, ibuprofen, omeprazole, oxycodone, polyethylene glycol powder, topiramate, ubrelvy, valacyclovir, and vitamin b-12.   Allergies: Patient is allergic to levaquin [levofloxacin] and methylchloroisothiazolinone [methylisothiazolinone].   Physical Exam: BP (!) 146/93   Pulse 83   Wt 210 lb (95.3 kg)   LMP  (LMP Unknown) Comment: patient had spotting in October 2020  BMI 31.01 kg/m    Pelvic Examination:  Vagina: Suture at introitus not intact, superficial wound separation present. No active bleeding. Posterior vaginal sutures intact, but bleeding present distally on the posterior vagina. No bleeding from suburethral sutures. No mesh noted.   ---------------------------------------------------------  Assessment and Plan:  1. Urinary frequency   2. Vaginal atrophy   3. Urinary urgency     - small wound separation at the introitus, no active bleeding present - small amount of bleeding from the posterior vagina. Discussed this can be normal healing.  - will start vaginal estrogen, estrace cream 0.5g nightly for 2 weeks then twice a week to help with vaginal healing.  - Continue to limit activity until healing complete  Follow up as scheduled in 2 weeks  Jaquita Folds, MD

## 2020-11-25 NOTE — Patient Instructions (Signed)
Start vaginal estrogen therapy nightly for two weeks then 2 times weekly at night for treatment of vaginal atrophy (dryness of the vaginal tissues).

## 2020-11-27 LAB — URINE CULTURE

## 2020-12-07 ENCOUNTER — Encounter: Payer: Self-pay | Admitting: Obstetrics and Gynecology

## 2020-12-07 ENCOUNTER — Other Ambulatory Visit: Payer: Self-pay

## 2020-12-07 ENCOUNTER — Ambulatory Visit (INDEPENDENT_AMBULATORY_CARE_PROVIDER_SITE_OTHER): Payer: BC Managed Care – PPO | Admitting: Obstetrics and Gynecology

## 2020-12-07 VITALS — BP 132/88 | HR 78 | Wt 210.0 lb

## 2020-12-07 DIAGNOSIS — N939 Abnormal uterine and vaginal bleeding, unspecified: Secondary | ICD-10-CM

## 2020-12-07 DIAGNOSIS — R350 Frequency of micturition: Secondary | ICD-10-CM

## 2020-12-07 DIAGNOSIS — Z9889 Other specified postprocedural states: Secondary | ICD-10-CM

## 2020-12-07 LAB — POCT URINALYSIS DIPSTICK
Appearance: NORMAL
Bilirubin, UA: NEGATIVE
Blood, UA: NEGATIVE
Glucose, UA: NEGATIVE
Ketones, UA: NEGATIVE
Leukocytes, UA: NEGATIVE
Nitrite, UA: NEGATIVE
Protein, UA: NEGATIVE
Spec Grav, UA: 1.02 (ref 1.010–1.025)
Urobilinogen, UA: 0.2 E.U./dL
pH, UA: 5.5 (ref 5.0–8.0)

## 2020-12-07 NOTE — Progress Notes (Signed)
[facility]  Date of Visit: 12/07/2020  History of Present Illness: Gwendolyn Ramos is a 55 y.o. female scheduled today for a post-operative visit.   Surgery: s/p s/p Posterior repair and perineorrhaphy, midurethral sling and cystoscopy on 10/26/20   She passed her postoperative void trial.   Recently seen for vaginal bleeding and noted small perineal wound separation, and bleeding for the posterior vaginal incision. Has been using vaginal estrogen.   Today she reports she is having reddish yellow discharge. It has an odor. She feels that it is urine that leaks out with standing. Leakage seems to be worse since the surgery. Notices blood on pad near the urine stain.    Medications: She has a current medication list which includes the following prescription(s): acetaminophen, betamethasone dipropionate, bupropion, estradiol, ibuprofen, omeprazole, oxycodone, polyethylene glycol powder, topiramate, ubrelvy, valacyclovir, and vitamin b-12.   Allergies: Patient is allergic to levaquin [levofloxacin] and methylchloroisothiazolinone [methylisothiazolinone].   Physical Exam: BP 132/88   Pulse 78   Wt 210 lb (95.3 kg)   LMP  (LMP Unknown) Comment: patient had spotting in October 2020  BMI 31.01 kg/m    Urethra prepped and straight cath performed- urine appears clear yellow.   Pelvic Examination: Vagina: Incisions healing well. Sutures are present at incision line on the posterior vagina and there is not granulation tissue. Small separation of the perineal wound, improving. Small area of bleeding at the perineal site cauterized with silver nitrate. Suburethral inicison well healed. No visible or palpable mesh.  POP-Q: deferred   POC urine: negative ---------------------------------------------------------  Assessment and Plan:  1. Post-operative state   2. Urinary frequency   3. Vaginal bleeding     - continue with vaginal estrogen cream for wound healing, improving  overall - We discussed that urine leakage can still be changing after surgery. Likely had some urethral support from the posterior vaginal prolapse which was then repaired. We discussed patients can continue to see improvement with the sling for several months after surgery while it is healing. No concern for blood in the urine with straight cath specimen.   Will recheck wound in 2 weeks  Jaquita Folds, MD

## 2020-12-10 ENCOUNTER — Ambulatory Visit (HOSPITAL_COMMUNITY): Payer: BC Managed Care – PPO

## 2020-12-23 ENCOUNTER — Other Ambulatory Visit: Payer: Self-pay

## 2020-12-23 ENCOUNTER — Ambulatory Visit (INDEPENDENT_AMBULATORY_CARE_PROVIDER_SITE_OTHER): Payer: BC Managed Care – PPO | Admitting: Obstetrics and Gynecology

## 2020-12-23 ENCOUNTER — Encounter: Payer: Self-pay | Admitting: Obstetrics and Gynecology

## 2020-12-23 VITALS — BP 128/66 | HR 81

## 2020-12-23 DIAGNOSIS — Z9889 Other specified postprocedural states: Secondary | ICD-10-CM

## 2020-12-23 NOTE — Progress Notes (Signed)
[facility]  Date of Visit: 12/23/2020  History of Present Illness: Ms. Gwendolyn Ramos is a 55 y.o. female scheduled today for a post-operative visit.   Surgery: s/p s/p Posterior repair and perineorrhaphy, midurethral sling and cystoscopy on 10/26/20   She passed her postoperative void trial.   The bleeding stopped and leakage has greatly improved. Still will have occasional leakage with cough. Still feels like she has to strain to void. Has been using the vaginal estrogen a few days a week.   Medications: She has a current medication list which includes the following prescription(s): acetaminophen, betamethasone dipropionate, bupropion, estradiol, ibuprofen, omeprazole, oxycodone, polyethylene glycol powder, topiramate, ubrelvy, valacyclovir, and vitamin b-12.   Allergies: Patient is allergic to levaquin [levofloxacin] and methylchloroisothiazolinone [methylisothiazolinone].   Physical Exam: BP 128/66   Pulse 81   LMP  (LMP Unknown) Comment: patient had spotting in October 2020    Pelvic Examination: Vagina: Incisions healing well. Posterior vagina well healed. Subcentimeter area at perineum still healing. No erythema or discharge present.  Suburethral inicison well healed. No visible or palpable mesh.  POP-Q: deferred  ---------------------------------------------------------  Assessment and Plan:  1. Post-operative state     - continue vaginal estrogen cream twice weekly at introitus - Defer intercourse for another two weeks to allow for complete healing. Can swim if desired (taking trip to [place]).  - Avoid pushing/ straining with urination and work on relaxing with voiding.  - Discussed exercise and lifting and working to engage core/ pelvic floor muscles to decrease pressure and reduce recurrence of prolapse.   Will recheck wound in 4 weeks  Jaquita Folds, MD

## 2020-12-29 ENCOUNTER — Other Ambulatory Visit: Payer: BC Managed Care – PPO

## 2020-12-30 ENCOUNTER — Ambulatory Visit (HOSPITAL_COMMUNITY)
Admission: RE | Admit: 2020-12-30 | Discharge: 2020-12-30 | Disposition: A | Discharge status: Home / Self Care | Payer: BC Managed Care – PPO | Source: Ambulatory Visit | Attending: Oncology | Admitting: Oncology

## 2020-12-30 ENCOUNTER — Other Ambulatory Visit: Payer: Self-pay

## 2020-12-30 ENCOUNTER — Inpatient Hospital Stay: Payer: BC Managed Care – PPO | Attending: Oncology

## 2020-12-30 DIAGNOSIS — Z811 Family history of alcohol abuse and dependence: Secondary | ICD-10-CM | POA: Insufficient documentation

## 2020-12-30 DIAGNOSIS — M47812 Spondylosis without myelopathy or radiculopathy, cervical region: Secondary | ICD-10-CM | POA: Insufficient documentation

## 2020-12-30 DIAGNOSIS — Z79899 Other long term (current) drug therapy: Secondary | ICD-10-CM | POA: Insufficient documentation

## 2020-12-30 DIAGNOSIS — D472 Monoclonal gammopathy: Secondary | ICD-10-CM | POA: Insufficient documentation

## 2020-12-30 DIAGNOSIS — Z803 Family history of malignant neoplasm of breast: Secondary | ICD-10-CM | POA: Insufficient documentation

## 2020-12-30 DIAGNOSIS — M170 Bilateral primary osteoarthritis of knee: Secondary | ICD-10-CM | POA: Insufficient documentation

## 2020-12-30 DIAGNOSIS — Z8349 Family history of other endocrine, nutritional and metabolic diseases: Secondary | ICD-10-CM | POA: Insufficient documentation

## 2020-12-30 DIAGNOSIS — Z836 Family history of other diseases of the respiratory system: Secondary | ICD-10-CM | POA: Insufficient documentation

## 2020-12-30 DIAGNOSIS — Z9049 Acquired absence of other specified parts of digestive tract: Secondary | ICD-10-CM | POA: Insufficient documentation

## 2020-12-30 DIAGNOSIS — Z881 Allergy status to other antibiotic agents status: Secondary | ICD-10-CM | POA: Insufficient documentation

## 2020-12-30 DIAGNOSIS — Z833 Family history of diabetes mellitus: Secondary | ICD-10-CM | POA: Insufficient documentation

## 2020-12-30 DIAGNOSIS — Z832 Family history of diseases of the blood and blood-forming organs and certain disorders involving the immune mechanism: Secondary | ICD-10-CM | POA: Insufficient documentation

## 2020-12-30 DIAGNOSIS — Z8616 Personal history of COVID-19: Secondary | ICD-10-CM | POA: Insufficient documentation

## 2020-12-30 DIAGNOSIS — M5136 Other intervertebral disc degeneration, lumbar region: Secondary | ICD-10-CM | POA: Insufficient documentation

## 2020-12-30 DIAGNOSIS — Z8249 Family history of ischemic heart disease and other diseases of the circulatory system: Secondary | ICD-10-CM | POA: Insufficient documentation

## 2020-12-30 DIAGNOSIS — M47816 Spondylosis without myelopathy or radiculopathy, lumbar region: Secondary | ICD-10-CM | POA: Insufficient documentation

## 2020-12-30 LAB — CBC WITH DIFFERENTIAL/PLATELET
Abs Immature Granulocytes: 0.03 10*3/uL (ref 0.00–0.07)
Basophils Absolute: 0.1 10*3/uL (ref 0.0–0.1)
Basophils Relative: 1 %
Eosinophils Absolute: 0.2 10*3/uL (ref 0.0–0.5)
Eosinophils Relative: 2 %
HCT: 40.5 % (ref 36.0–46.0)
Hemoglobin: 14.1 g/dL (ref 12.0–15.0)
Immature Granulocytes: 0 %
Lymphocytes Relative: 29 %
Lymphs Abs: 2.4 10*3/uL (ref 0.7–4.0)
MCH: 32 pg (ref 26.0–34.0)
MCHC: 34.8 g/dL (ref 30.0–36.0)
MCV: 91.8 fL (ref 80.0–100.0)
Monocytes Absolute: 0.8 10*3/uL (ref 0.1–1.0)
Monocytes Relative: 9 %
Neutro Abs: 4.7 10*3/uL (ref 1.7–7.7)
Neutrophils Relative %: 59 %
Platelets: 316 10*3/uL (ref 150–400)
RBC: 4.41 MIL/uL (ref 3.87–5.11)
RDW: 14.1 % (ref 11.5–15.5)
WBC: 8.2 10*3/uL (ref 4.0–10.5)
nRBC: 0 % (ref 0.0–0.2)

## 2020-12-30 LAB — COMPREHENSIVE METABOLIC PANEL
ALT: 36 U/L (ref 0–44)
AST: 25 U/L (ref 15–41)
Albumin: 4.2 g/dL (ref 3.5–5.0)
Alkaline Phosphatase: 63 U/L (ref 38–126)
Anion gap: 10 (ref 5–15)
BUN: 14 mg/dL (ref 6–20)
CO2: 20 mmol/L — ABNORMAL LOW (ref 22–32)
Calcium: 9.7 mg/dL (ref 8.9–10.3)
Chloride: 109 mmol/L (ref 98–111)
Creatinine, Ser: 1.15 mg/dL — ABNORMAL HIGH (ref 0.44–1.00)
GFR, Estimated: 56 mL/min — ABNORMAL LOW (ref >60)
Glucose, Bld: 107 mg/dL — ABNORMAL HIGH (ref 70–99)
Potassium: 4.2 mmol/L (ref 3.5–5.1)
Sodium: 139 mmol/L (ref 135–145)
Total Bilirubin: 0.6 mg/dL (ref 0.3–1.2)
Total Protein: 8.5 g/dL — ABNORMAL HIGH (ref 6.5–8.1)

## 2020-12-30 LAB — IRON AND TIBC
Iron: 148 ug/dL — ABNORMAL HIGH (ref 41–142)
Saturation Ratios: 60 % — ABNORMAL HIGH (ref 21–57)
TIBC: 248 ug/dL (ref 236–444)
UIBC: 100 ug/dL — ABNORMAL LOW (ref 120–384)

## 2020-12-30 LAB — FERRITIN: Ferritin: 93 ng/mL (ref 11–307)

## 2020-12-31 LAB — KAPPA/LAMBDA LIGHT CHAINS
Kappa free light chain: 51.4 mg/L — ABNORMAL HIGH (ref 3.3–19.4)
Kappa, lambda light chain ratio: 6.76 — ABNORMAL HIGH (ref 0.26–1.65)
Lambda free light chains: 7.6 mg/L (ref 5.7–26.3)

## 2021-01-02 ENCOUNTER — Other Ambulatory Visit: Payer: Self-pay | Admitting: Neurology

## 2021-01-04 LAB — MULTIPLE MYELOMA PANEL, SERUM
Albumin SerPl Elph-Mcnc: 4.1 g/dL (ref 2.9–4.4)
Albumin/Glob SerPl: 1.1 (ref 0.7–1.7)
Alpha 1: 0.2 g/dL (ref 0.0–0.4)
Alpha2 Glob SerPl Elph-Mcnc: 0.7 g/dL (ref 0.4–1.0)
B-Globulin SerPl Elph-Mcnc: 0.9 g/dL (ref 0.7–1.3)
Gamma Glob SerPl Elph-Mcnc: 2.2 g/dL — ABNORMAL HIGH (ref 0.4–1.8)
Globulin, Total: 4 g/dL — ABNORMAL HIGH (ref 2.2–3.9)
IgA: 60 mg/dL — ABNORMAL LOW (ref 87–352)
IgG (Immunoglobin G), Serum: 2294 mg/dL — ABNORMAL HIGH (ref 586–1602)
IgM (Immunoglobulin M), Srm: 79 mg/dL (ref 26–217)
M Protein SerPl Elph-Mcnc: 1.3 g/dL — ABNORMAL HIGH
Total Protein ELP: 8.1 g/dL (ref 6.0–8.5)

## 2021-01-04 NOTE — Progress Notes (Signed)
[place]  [phone] Fax:([phone]) [phone]     ID: Gwendolyn Ramos DOB: 11/06/1965  MR#: [id number]  WUX#:[id number]  Patient Care Team: [name], MD as PCP - General (Family Medicine) Doralyn Kirkes, Virgie Dad, MD as Consulting Physician (Oncology) Megan Salon, MD as Consulting Physician (Gynecology) Pieter Partridge, DO as Consulting Physician (Neurology) Chauncey Cruel, MD OTHER MD:   CHIEF COMPLAINT: MGUS; hereditary hemochromatosis  CURRENT TREATMENT: Observation   INTERVAL HISTORY: [name] returns today for follow up of her MGUS and hereditary hemochromatosis.   Since her last visit, she underwent bone survey on 12/30/2020 showing: no acute or destructive bony lesions.  We have been checking lab work every 6 months, doing visits once a year.  Component     Latest Ref Rng & Units 09/09/2019 03/18/2020 09/03/2020 12/30/2020  IgG (Immunoglobin G), Serum     586 - 1,602 mg/dL 2,427 (H) 2,629 (H) 2,456 (H) 2,294 (H)   Component     Latest Ref Rng & Units 09/09/2019 03/18/2020 09/03/2020 12/30/2020  M Protein SerPl Elph-Mcnc     Not Observed g/dL 1.7 (H) 1.7 (H) 1.8 (H) 1.3 (H)   Component     Latest Ref Rng & Units 09/09/2019 03/18/2020 09/03/2020 12/30/2020  Kappa, lambda light chain ratio     0.26 - 1.65 7.65 (H) 5.61 (H) 5.29 (H) 6.76 (H)   We are also following her ferritin and iron saturation:  Latest Reference Range & Units 03/18/20 10:16 09/03/20 08:16 12/30/20 08:14  Ferritin 11 - 307 ng/mL 76 82 93    Latest Reference Range & Units 03/18/20 10:16 09/03/20 08:16 12/30/20 08:14  Saturation Ratios 21 - 57 % 26 37 60 (H)   REVIEW OF SYSTEMS: Chai underwent a perineal repair and mid urethral sling and cystoscopy.  Because of the surgery she was not able to exercise for 2 or 3 months but she is now back to walking 3 or 3-1/2 miles most days.  She tells me she is not cooking with iron utensils but is eating a fair amount of red meat.   A detailed review of systems today was otherwise noncontributory   COVID 19 VACCINATION STATUS: Status post Pfizer x4 and COVID x2   HISTORY OF CURRENT ILLNESS: From the original intake note:  Gwendolyn Ramos has a history of monoclonal gammopathy and hereditary hemochromatosis. She has been followed by Dr. Wilnette Kales since 2016 while living in [place].   She was tested for hemochromatosis because her father had that problem.  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  Dr. Bridgett Larsson has been following her every 6 months.  His most recent ferritin on June 26, 2018 was 61 and her transferrin saturation was 55%.  The patient also has an IgG kappa MGUS.  This is also being followed every 6 months.  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 10/21/20   Posterior vaginal wall prolapse     PAST SURGICAL HISTORY: Past Surgical History:  Procedure Laterality Date   BLADDER SUSPENSION N/A 10/26/2020   Procedure: TRANSVAGINAL TAPE (TVT) PROCEDURE;  Surgeon: Jaquita Folds, MD;   Location: [facility];  Service: Gynecology;  Laterality: N/A;   BUNIONECTOMY Bilateral    CHOLECYSTECTOMY  2001   CYSTOSCOPY N/A 10/26/2020   Procedure: CYSTOSCOPY;  Surgeon: Jaquita Folds, MD;  Location: [facility];  Service: Gynecology;  Laterality: N/A;   NASAL SEPTUM SURGERY     RECTOCELE REPAIR N/A 10/26/2020   Procedure: POSTERIOR REPAIR (RECTOCELE) with perineorrhaphy;  Surgeon: Jaquita Folds, MD;  Location: [facility];  Service: Gynecology;  Laterality: N/A;  total time requested for all procedures is 1.5 hours   TONSILLECTOMY      FAMILY HISTORY: Family History  Problem Relation Age of Onset   Diabetes Mother    COPD Mother    Congestive Heart Failure Mother    Thyroid disease Mother    Hemochromatosis Father    Alcoholism Father    Diabetes Sister    Asthma Sister    Asthma Brother    Diabetes Maternal Aunt    Breast cancer Maternal Aunt    Diabetes Other   Patient's father was 57 years old when he died from alcoholic cirrhosis in the setting of hemochromatosis. Patient's mother is 74 years old as of January 2021.  The patient has 3 siblings, 2 full brothers and 1 half sister.  The patient's brothers have not been tested for hemochromatosis.  The patient's sister is by different father   GYNECOLOGIC HISTORY:  No LMP recorded (lmp unknown). Patient is postmenopausal. Menarche: 55 years old Age at first live birth: 55 years old [place] P 2 HRT no Hysterectomy? no BSO? 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  Her husband [name] runs [employer] and does a great deal of international traveling.  Their 2 daughters are [name], 20, studying business at [employer], and [place], [place], [place] psychology at [employer].    ADVANCED DIRECTIVES: In the absence of any documents to the contrary the patient's husband is her healthcare power of attorney   HEALTH  MAINTENANCE: Social History   Tobacco Use   Smoking status: Never   Smokeless tobacco: Never  Vaping Use   Vaping Use: Never used  Substance Use Topics   Alcohol use: Yes    Alcohol/week: 3.0 standard drinks    Types: 3 Glasses of wine per week   Drug use: Never     Colonoscopy: 2017  PAP: Up-to-date  Bone density:    Allergies  Allergen Reactions   Levaquin [Levofloxacin]     High fever, elevated heart rate   Methylchloroisothiazolinone [Methylisothiazolinone]     In liquid soaps causes skin irritation.    Current Outpatient Medications  Medication Sig Dispense Refill   Multiple Vitamins-Minerals (MULTIVITAMIN WITH MINERALS) tablet Take 1 tablet by  mouth daily.     Ubrogepant (UBRELVY) 100 MG TABS Take by mouth. 30 tablet    vitamin E 180 MG (400 UNITS) capsule Take 1 capsule (400 Units total) by mouth daily.     betamethasone dipropionate 0.05 % cream Apply topically 2 (two) times daily.     buPROPion (WELLBUTRIN SR) 150 MG 12 hr tablet Take 150 mg by mouth 2 (two) times daily.     estradiol (ESTRACE) 0.1 MG/GM vaginal cream Place 0.5g nightly for two weeks then twice a week after 30 g 11   omeprazole (PRILOSEC) 20 MG capsule Take 20 mg by mouth daily.     topiramate (TOPAMAX) 25 MG tablet TAKE 3 TABLETS BY MOUTH AT BEDTIME. 270 tablet 1   UBRELVY 100 MG TABS TAKE 1 TABLET BY MOUTH AS NEEDED (MAY REPEAT IN 2 HOURS IF NEEDED. MAXIMUM 2 TABLETS IN 24 HOURS.). 10 tablet 3   valACYclovir (VALTREX) 500 MG tablet Take 1 tablet (500 mg total) by mouth daily. 90 tablet 4   vitamin B-12 (CYANOCOBALAMIN) 1000 MCG tablet Take 1,000 mcg by mouth daily.     No current facility-administered medications for this visit.    OBJECTIVE: White woman who appears well  Vitals:   01/05/21 1425  BP: (!) 144/81  Pulse: 83  Resp: 18  Temp: 97.9 F (36.6 C)  SpO2: 98%      Body mass index is 32.3 kg/m.   Wt Readings from Last 3 Encounters:  01/05/21 218 lb 11.2 oz (99.2 kg)   12/07/20 210 lb (95.3 kg)  11/25/20 210 lb (95.3 kg)      ECOG FS:1 - Symptomatic but completely ambulatory  Sclerae unicteric, EOMs intact Wearing a mask No cervical or supraclavicular adenopathy Lungs no rales or rhonchi Heart regular rate and rhythm Abd soft, nontender, positive bowel sounds MSK no focal spinal tenderness, no upper extremity lymphedema Neuro: nonfocal, well oriented, appropriate affect Breasts: There are no palpable masses.  There are no skin or nipple changes of concern.  Both axillae are benign.   LAB RESULTS:  CMP     Component Value Date/Time   NA 139 12/30/2020 0814   K 4.2 12/30/2020 0814   CL 109 12/30/2020 0814   CO2 20 (L) 12/30/2020 0814   GLUCOSE 107 (H) 12/30/2020 0814   BUN 14 12/30/2020 0814   CREATININE 1.15 (H) 12/30/2020 0814   CALCIUM 9.7 12/30/2020 0814   PROT 8.5 (H) 12/30/2020 0814   ALBUMIN 4.2 12/30/2020 0814   AST 25 12/30/2020 0814   ALT 36 12/30/2020 0814   ALKPHOS 63 12/30/2020 0814   BILITOT 0.6 12/30/2020 0814   GFRNONAA 56 (L) 12/30/2020 0814   GFRAA >60 09/09/2019 1434    Lab Results  Component Value Date   TOTALPROTELP 8.1 12/30/2020    Lab Results  Component Value Date   WBC 8.2 12/30/2020   NEUTROABS 4.7 12/30/2020   HGB 14.1 12/30/2020   HCT 40.5 12/30/2020   MCV 91.8 12/30/2020   PLT 316 12/30/2020    No results found for: LABCA2  No components found for: [id number]  No results for input(s): INR in the last 168 hours.  No results found for: LABCA2  No results found for: TGG269  No results found for: SWN462  No results found for: VOJ500  No results found for: CA2729  No components found for: HGQUANT  No results found for: CEA1 / No results found for: CEA1   No results found for: AFPTUMOR  No  results found for: [facility]  Lab Results  Component Value Date   KPAFRELGTCHN 51.4 (H) 12/30/2020   LAMBDASER 7.6 12/30/2020   KAPLAMBRATIO 6.76 (H) 12/30/2020   (kappa/lambda light  chains)  No results found for: HGBA, HGBA2QUANT, HGBFQUANT, HGBSQUAN (Hemoglobinopathy evaluation)   No results found for: LDH  Lab Results  Component Value Date   IRON 148 (H) 12/30/2020   TIBC 248 12/30/2020   IRONPCTSAT 60 (H) 12/30/2020   (Iron and TIBC)  Lab Results  Component Value Date   FERRITIN 93 12/30/2020    Urinalysis    Component Value Date/Time   BILIRUBINUR Negative 12/07/2020 1146   PROTEINUR Negative 12/07/2020 1146   UROBILINOGEN 0.2 12/07/2020 1146   NITRITE Negative 12/07/2020 1146   LEUKOCYTESUR Negative 12/07/2020 1146    STUDIES: DG Bone Survey Met  Result Date: 12/30/2020 CLINICAL DATA:  MGUS EXAM: METASTATIC BONE SURVEY COMPARISON:  None. FINDINGS: Standard skeletal survey was performed. Lateral skull: No acute or destructive bony lesions. Upper extremities: Frontal views of the bilateral upper extremities are obtained from the shoulders through the wrists. There are no acute or destructive bony lesions. Alignment is anatomic. Spine: Frontal and lateral views of the cervical, thoracic, and lumbar spine are obtained. Mild lower cervical spondylosis from C4 through C7. Mild lumbar spondylosis at L2-3 and L3-4. Prominent facet hypertrophic changes are seen from L3 through S1. There are no acute or destructive bony lesions. Chest: Frontal view of the chest demonstrates an unremarkable cardiac silhouette. The lungs are clear. Prior healed left rib fractures. No acute or destructive bony lesions. Pelvis: Frontal view of the pelvis demonstrates no acute or destructive bony lesions. Alignment is anatomic. Lower extremities: Frontal views of the bilateral lower extremities are obtained from the hips through the ankles. There are no acute or destructive bony lesions. Alignment is anatomic. Mild bilateral knee osteoarthritis, right greater than left. IMPRESSION: 1. No acute or destructive bony lesions. 2. Multifocal degenerative changes throughout the cervical spine,  lumbar spine, and bilateral knees. Electronically Signed   By: Randa Ngo M.D.   On: 12/30/2020 14:30     ELIGIBLE FOR AVAILABLE RESEARCH PROTOCOL: no  ASSESSMENT: 55 y.o. [place] woman with:  (1) IgG kappa MGUS  (a) plan is to check SPEP/Lambda light chains every 6 months  (b)   bone survey 12/30/2020 finds no suspicious lesions.  There is degenerative disease throughout the cervical and lumbar spine and bilateral knees  (2) hemochromatosis: Compound heterozygote: C282Y and H63D positive  (a) plan is to check ferritin and hemoglobin saturation every 6 months  (b) consider phlebotomy if ferritin greater than 150 or saturation greater than 80%   PLAN: Quenisha is doing fine as far as the 2 concerns we follow her for.  She certainly needs no intervention at this point.  I would be comfortable following her labs on an every 6 month basis.  She will see is accordingly in June with labs a few days before that visit.  There has been a trend upward and her iron saturation and ferritin.  I suggested she stay away from red meat or minimize it at least.  She tells me her daughter has been found to be heterozygous for one of the genes she carries but she does not know which one.  This is not likely to be of clinical significance of course but she will give [place] the information on in more detail at the next visit.  Total encounter time 20 minutes.Virgie Dad  Temperence Zenor, MD   01/05/2021 5:17 PM Medical Oncology and Hematology [facility] [place], [place] [address] Tel. [phone]    Fax. [phone]   This document serves as a record of services personally performed by Lurline Del, MD. It was created on his behalf by Wilburn Mylar, a trained medical scribe. The creation of this record is based on the scribe's personal observations and the provider's statements to them.   I, Lurline Del MD, have reviewed the above documentation for accuracy and  completeness, and I agree with the above.   *Total Encounter Time as defined by the Centers for Medicare and Medicaid Services includes, in addition to the face-to-face time of a patient visit (documented in the note above) non-face-to-face time: obtaining and reviewing outside history, ordering and reviewing medications, tests or procedures, care coordination (communications with other health care professionals or caregivers) and documentation in the medical record.

## 2021-01-05 ENCOUNTER — Other Ambulatory Visit: Payer: Self-pay

## 2021-01-05 ENCOUNTER — Inpatient Hospital Stay (HOSPITAL_BASED_OUTPATIENT_CLINIC_OR_DEPARTMENT_OTHER): Payer: BC Managed Care – PPO | Admitting: Oncology

## 2021-01-05 VITALS — BP 144/81 | HR 83 | Temp 97.9°F | Resp 18 | Ht 69.0 in | Wt 218.7 lb

## 2021-01-05 DIAGNOSIS — Z833 Family history of diabetes mellitus: Secondary | ICD-10-CM | POA: Diagnosis not present

## 2021-01-05 DIAGNOSIS — D472 Monoclonal gammopathy: Secondary | ICD-10-CM | POA: Diagnosis not present

## 2021-01-05 DIAGNOSIS — Z832 Family history of diseases of the blood and blood-forming organs and certain disorders involving the immune mechanism: Secondary | ICD-10-CM | POA: Diagnosis not present

## 2021-01-05 DIAGNOSIS — L900 Lichen sclerosus et atrophicus: Secondary | ICD-10-CM | POA: Diagnosis not present

## 2021-01-05 DIAGNOSIS — Z881 Allergy status to other antibiotic agents status: Secondary | ICD-10-CM | POA: Diagnosis not present

## 2021-01-05 DIAGNOSIS — Z811 Family history of alcohol abuse and dependence: Secondary | ICD-10-CM | POA: Diagnosis not present

## 2021-01-05 DIAGNOSIS — M5136 Other intervertebral disc degeneration, lumbar region: Secondary | ICD-10-CM | POA: Diagnosis not present

## 2021-01-05 DIAGNOSIS — Z9049 Acquired absence of other specified parts of digestive tract: Secondary | ICD-10-CM | POA: Diagnosis not present

## 2021-01-05 DIAGNOSIS — Z8249 Family history of ischemic heart disease and other diseases of the circulatory system: Secondary | ICD-10-CM | POA: Diagnosis not present

## 2021-01-05 DIAGNOSIS — Z836 Family history of other diseases of the respiratory system: Secondary | ICD-10-CM | POA: Diagnosis not present

## 2021-01-05 DIAGNOSIS — M47812 Spondylosis without myelopathy or radiculopathy, cervical region: Secondary | ICD-10-CM | POA: Diagnosis not present

## 2021-01-05 DIAGNOSIS — Z79899 Other long term (current) drug therapy: Secondary | ICD-10-CM | POA: Diagnosis not present

## 2021-01-05 DIAGNOSIS — M170 Bilateral primary osteoarthritis of knee: Secondary | ICD-10-CM | POA: Diagnosis not present

## 2021-01-05 DIAGNOSIS — Z8349 Family history of other endocrine, nutritional and metabolic diseases: Secondary | ICD-10-CM | POA: Diagnosis not present

## 2021-01-05 DIAGNOSIS — Z8616 Personal history of COVID-19: Secondary | ICD-10-CM | POA: Diagnosis not present

## 2021-01-05 DIAGNOSIS — Z803 Family history of malignant neoplasm of breast: Secondary | ICD-10-CM | POA: Diagnosis not present

## 2021-01-05 DIAGNOSIS — M47816 Spondylosis without myelopathy or radiculopathy, lumbar region: Secondary | ICD-10-CM | POA: Diagnosis not present

## 2021-01-06 IMAGING — MG DIGITAL SCREENING BILAT W/ TOMO W/ CAD
8 series · 8 of 24 positions shown · non-contrast
Comparison: Previous exam(s).

CLINICAL DATA: Screening.

EXAM:
DIGITAL SCREENING BILATERAL MAMMOGRAM WITH TOMO AND CAD

[L CC synth-2D]
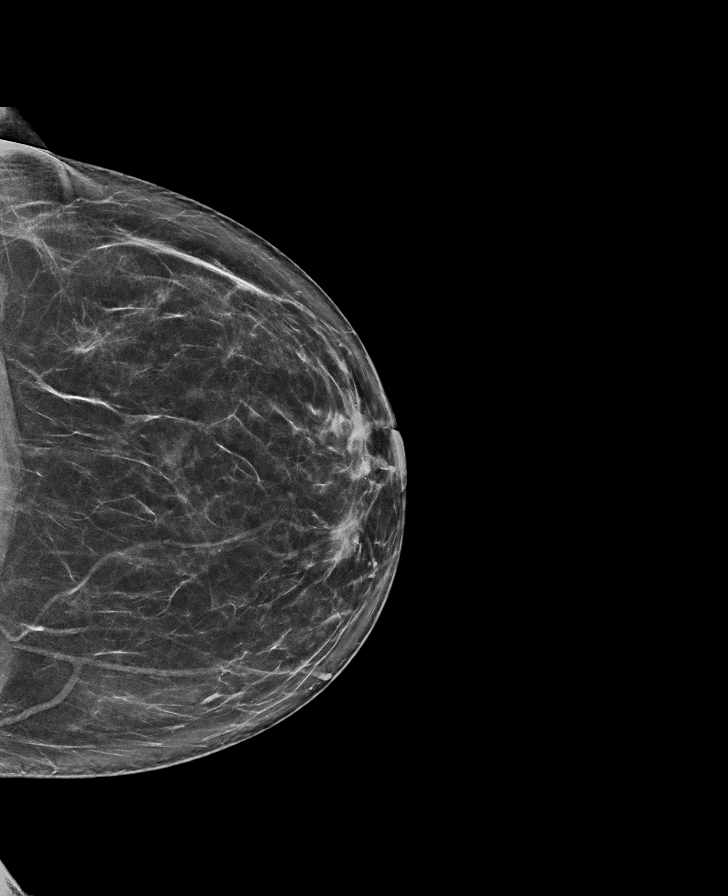

[R CC synth-2D]
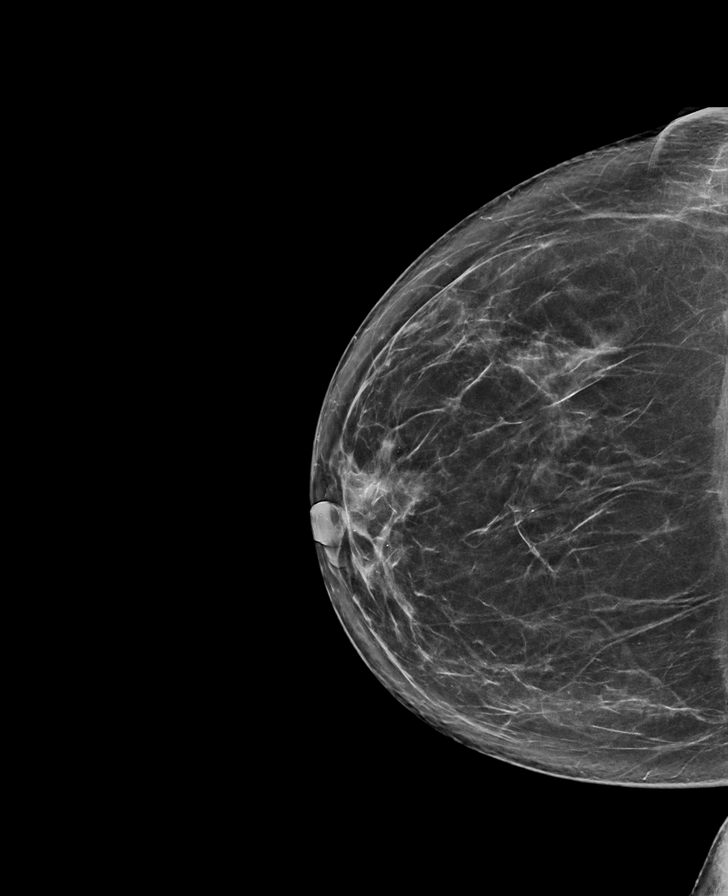

[L MLO synth-2D]
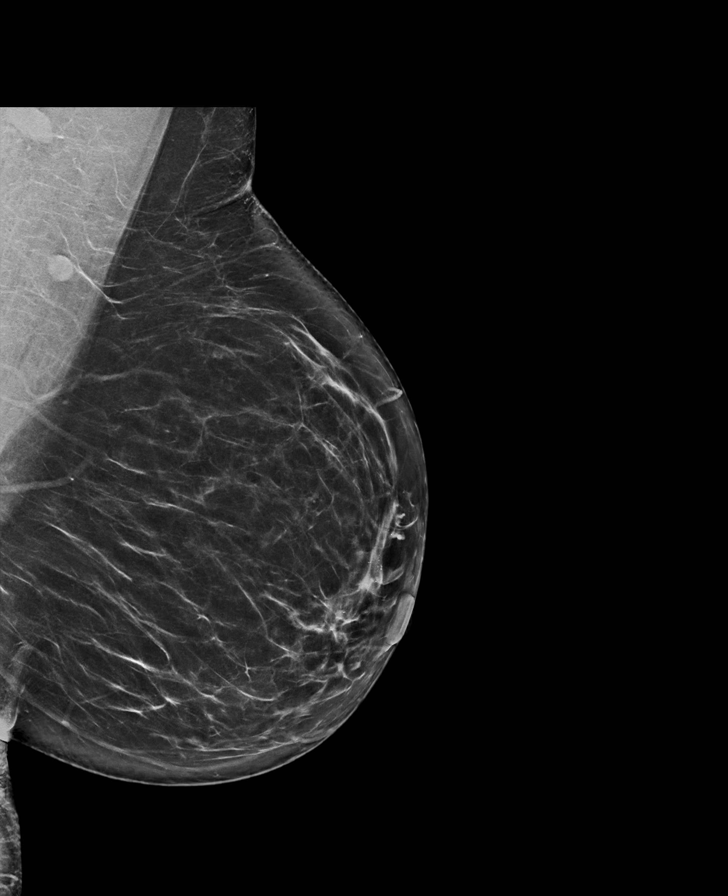

[R MLO synth-2D]
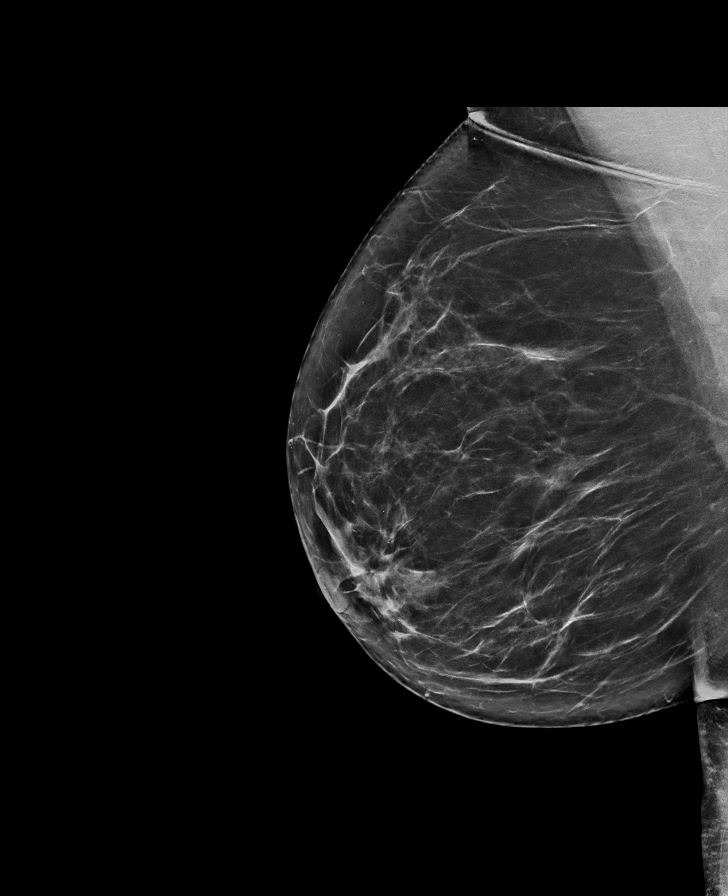

[L CC tomo · tomo slice 38/75.0]
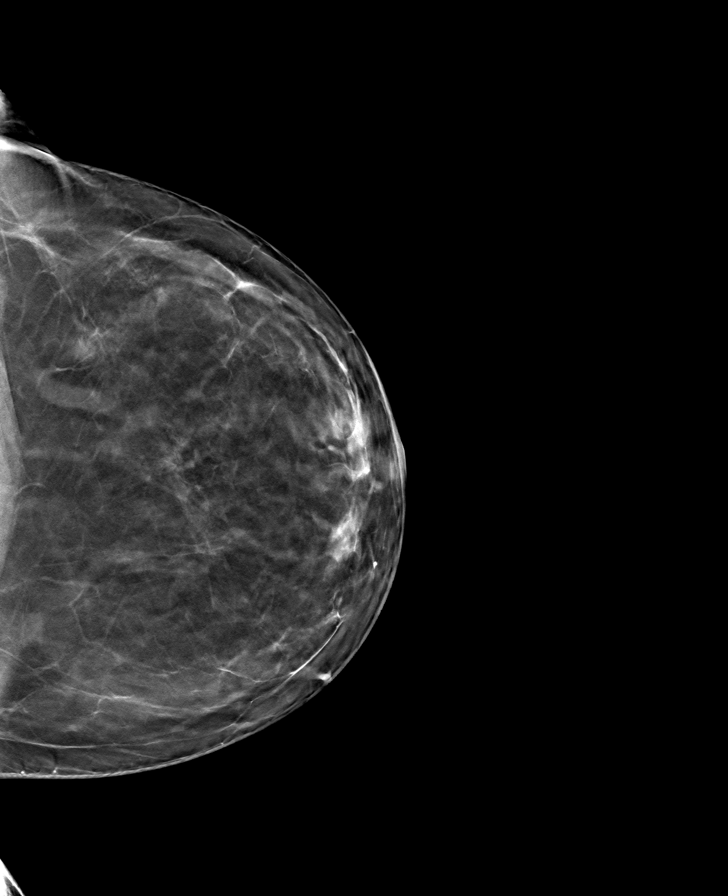

[R MLO tomo · tomo slice 41/82.0]
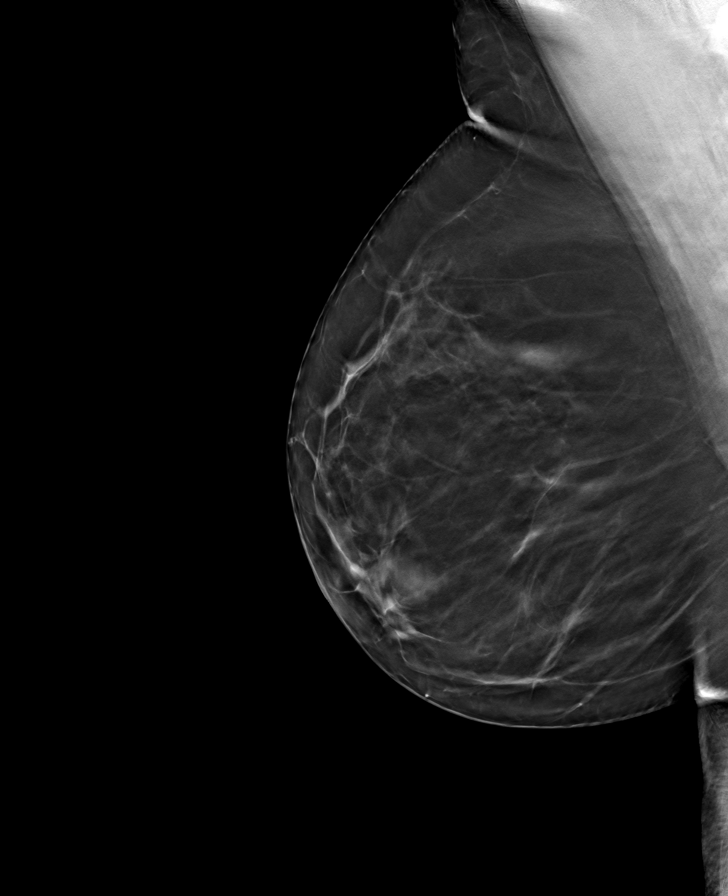

[R CC tomo · tomo slice 39/77.0]
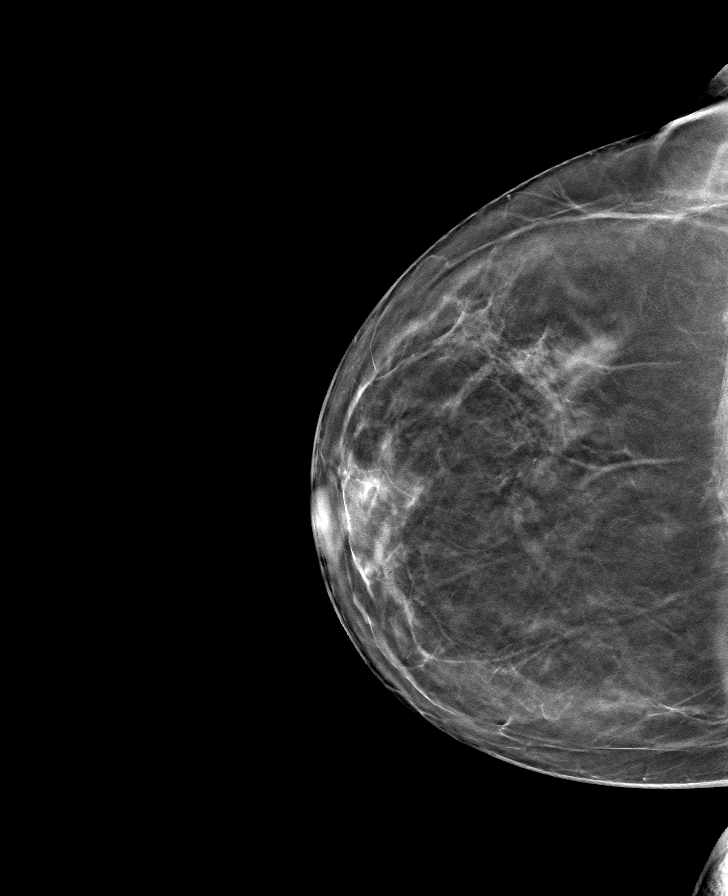

[L MLO tomo · tomo slice 41/81.0]
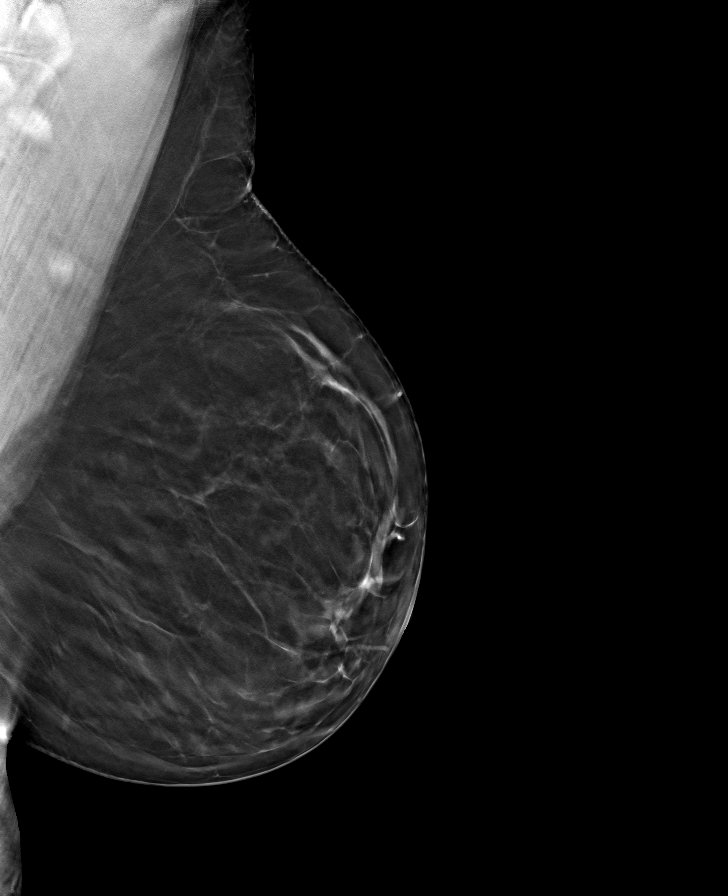

[8 of 24 positions shown; findings below may reference images not displayed]

ACR Breast Density Category b: There are scattered areas of
fibroglandular density.
FINDINGS: There are no findings suspicious for malignancy. Images were
processed with CAD.
IMPRESSION: No mammographic evidence of malignancy. A result letter of this
screening mammogram will be mailed directly to the patient.

RECOMMENDATION:
Screening mammogram in one year. (Code:[id number])

BI-RADS CATEGORY  1: Negative.

## 2021-01-28 ENCOUNTER — Encounter: Payer: Self-pay | Admitting: Plastic Surgery

## 2021-01-28 ENCOUNTER — Other Ambulatory Visit: Payer: Self-pay

## 2021-01-28 ENCOUNTER — Ambulatory Visit (INDEPENDENT_AMBULATORY_CARE_PROVIDER_SITE_OTHER): Payer: Self-pay | Admitting: Plastic Surgery

## 2021-01-28 DIAGNOSIS — Z411 Encounter for cosmetic surgery: Secondary | ICD-10-CM

## 2021-01-28 NOTE — Progress Notes (Signed)
Patient presents to discuss neuromodulator treatment for facial lines.  She is bothered by lines in the forehead, glabella and crows feet.  She has had Botox on and off for a number of years her most recent of which was back in March.  She does not recall the number of units that she typically gets.  We reviewed the risks and benefits of Botox treatment and she is interested in moving forward.  40 units of Botox were drawn up and distributed in the forehead, glabella and crows feet in a standard injection pattern with 3 injections at the crows feet and 2 rows for the forehead.  She tolerated this fine.  We will plan to see her at her next visit or sooner for any touchups.  She also is interested in laser for the lower face primarily and also may be interested in a breast reduction down the line.  All of her questions were answered.

## 2021-01-29 ENCOUNTER — Ambulatory Visit: Payer: BC Managed Care – PPO | Admitting: Obstetrics and Gynecology

## 2021-02-04 ENCOUNTER — Encounter: Payer: Self-pay | Admitting: Obstetrics and Gynecology

## 2021-02-04 ENCOUNTER — Other Ambulatory Visit: Payer: Self-pay

## 2021-02-04 ENCOUNTER — Ambulatory Visit (INDEPENDENT_AMBULATORY_CARE_PROVIDER_SITE_OTHER): Payer: BC Managed Care – PPO | Admitting: Obstetrics and Gynecology

## 2021-02-04 VITALS — BP 124/81 | HR 83

## 2021-02-04 DIAGNOSIS — M62838 Other muscle spasm: Secondary | ICD-10-CM

## 2021-02-04 MED ORDER — DIAZEPAM 5 MG PO TABS: ORAL_TABLET | ORAL | 0 refills | Status: DC

## 2021-02-04 NOTE — Progress Notes (Signed)
[facility]  Date of Visit: 02/04/2021  History of Present Illness: Gwendolyn Ramos is a 56 y.o. female scheduled today for a post-operative visit.   Surgery: s/p s/p Posterior repair and perineorrhaphy, midurethral sling and cystoscopy on 10/26/20   She passed her postoperative void trial.   Overall has been doing well. Had intercourse twice and had pain deep inside, and noticed some bleeding (less the second time).   Medications: She has a current medication list which includes the following prescription(s): betamethasone dipropionate, bupropion, diazepam, estradiol, multivitamin with minerals, omeprazole, topiramate, ubrelvy, ubrelvy, valacyclovir, vitamin b-12, and vitamin e.   Allergies: Patient is allergic to levaquin [levofloxacin] and methylchloroisothiazolinone [methylisothiazolinone].   Physical Exam: BP 124/81    Pulse 83    LMP  (LMP Unknown) Comment: patient had spotting in October 2020    Pelvic Examination: Vagina: Incisions healing well. Posterior vagina well healed. Perineum healed. No erythema or discharge present.  Suburethral inicison well healed. No visible or palpable mesh. On digital exam, no tenderness along the posterior vaginal wall. + tenderness on palpation of levators bilaterally.     ---------------------------------------------------------  Assessment and Plan:  1. Levator spasm     - Surgical sites well healed.  - For pelvic floor muscle spasm, she noted that vibration works well, so she can continue to use that as needed. Also prescribed valium 5mg  tab which she can use prior to intercourse for muscle relaxation. If these do not improve her symptoms, can consider pelvic PT.   Return as needed.   Jaquita Folds, MD  Time spent: I spent 20 minutes dedicated to the care of this patient on the date of this encounter to include pre-visit review of records, face-to-face time with the patient and post visit documentation and ordering  medication/ testing.

## 2021-03-12 ENCOUNTER — Encounter (HOSPITAL_BASED_OUTPATIENT_CLINIC_OR_DEPARTMENT_OTHER): Payer: Self-pay | Admitting: Obstetrics & Gynecology

## 2021-03-12 ENCOUNTER — Ambulatory Visit (INDEPENDENT_AMBULATORY_CARE_PROVIDER_SITE_OTHER): Payer: BC Managed Care – PPO | Admitting: Obstetrics & Gynecology

## 2021-03-12 ENCOUNTER — Other Ambulatory Visit: Payer: Self-pay

## 2021-03-12 ENCOUNTER — Other Ambulatory Visit (HOSPITAL_COMMUNITY)
Admission: RE | Admit: 2021-03-12 | Discharge: 2021-03-12 | Disposition: A | Discharge status: Home / Self Care | Payer: BC Managed Care – PPO | Source: Ambulatory Visit | Attending: Obstetrics & Gynecology | Admitting: Obstetrics & Gynecology

## 2021-03-12 VITALS — BP 115/84 | HR 92 | Ht 69.0 in | Wt 212.6 lb

## 2021-03-12 DIAGNOSIS — Z803 Family history of malignant neoplasm of breast: Secondary | ICD-10-CM

## 2021-03-12 DIAGNOSIS — Z124 Encounter for screening for malignant neoplasm of cervix: Secondary | ICD-10-CM | POA: Insufficient documentation

## 2021-03-12 DIAGNOSIS — L900 Lichen sclerosus et atrophicus: Secondary | ICD-10-CM

## 2021-03-12 DIAGNOSIS — Z01419 Encounter for gynecological examination (general) (routine) without abnormal findings: Secondary | ICD-10-CM

## 2021-03-12 DIAGNOSIS — D472 Monoclonal gammopathy: Secondary | ICD-10-CM

## 2021-03-12 DIAGNOSIS — B009 Herpesviral infection, unspecified: Secondary | ICD-10-CM

## 2021-03-12 MED ORDER — VALACYCLOVIR HCL 500 MG PO TABS: 500.0000 mg | ORAL_TABLET | Freq: Every day | ORAL | 4 refills | Status: DC

## 2021-03-12 NOTE — Progress Notes (Signed)
56 y.o. [id number] Married White or Caucasian female here for annual exam.  Is having a little vaginal bleeding with intercourse that she feels is related to the posterior repair.  She feels the sling has helped her incontinence.    Had posterior repair last year.  Has some pain with intercourse that Dr. Wannetta Sender feels is muscular.  Has tried vaginal valium.    No LMP recorded (lmp unknown). Patient is postmenopausal.          Sexually active: Yes.    The current method of family planning is post menopausal status.    Exercising: Yes.    Smoker:  no  Health Maintenance: Pap:  12/04/2018 Negative, neg HR HPV History of abnormal Pap:  no MMG:  04/23/2020 Negative Colonoscopy:  03/13/2015 in [place], in [place], follow up 10 years BMD:   not indicated Screening Labs: Dr. Laurann Montana   reports that she has never smoked. She has never used smokeless tobacco. She reports current alcohol use of about 3.0 standard drinks per week. She reports that she does not use drugs.  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 10/21/20   Posterior vaginal wall prolapse     Past Surgical History:  Procedure Laterality Date   BLADDER SUSPENSION N/A 10/26/2020   Procedure: TRANSVAGINAL TAPE (TVT) PROCEDURE;  Surgeon: Jaquita Folds, MD;  Location: [facility];  Service: Gynecology;  Laterality: N/A;   BUNIONECTOMY Bilateral    CHOLECYSTECTOMY  2001   CYSTOSCOPY N/A 10/26/2020   Procedure: CYSTOSCOPY;  Surgeon: Jaquita Folds, MD;  Location: [facility];  Service: Gynecology;  Laterality: N/A;   NASAL SEPTUM SURGERY     RECTOCELE REPAIR N/A 10/26/2020   Procedure: POSTERIOR REPAIR (RECTOCELE) with perineorrhaphy;  Surgeon: Jaquita Folds, MD;  Location: [facility];  Service: Gynecology;  Laterality: N/A;  total time requested for all procedures is 1.5 hours   TONSILLECTOMY      Current Outpatient Medications  Medication Sig Dispense Refill   betamethasone dipropionate 0.05 % cream Apply topically 2 (two) times daily.     buPROPion (WELLBUTRIN SR) 150 MG 12 hr tablet Take 150 mg by mouth 2 (two) times daily.     Multiple Vitamins-Minerals (MULTIVITAMIN WITH MINERALS) tablet Take 1 tablet by mouth daily.     omeprazole (PRILOSEC) 20 MG capsule Take 20 mg by mouth daily.     topiramate (TOPAMAX) 25 MG tablet TAKE 3 TABLETS BY MOUTH AT BEDTIME. 270 tablet 1   UBRELVY 100 MG TABS TAKE 1 TABLET BY MOUTH AS NEEDED (MAY REPEAT IN 2 HOURS IF NEEDED. MAXIMUM 2 TABLETS IN 24 HOURS.). 10 tablet 3   valACYclovir (VALTREX) 500 MG tablet Take  1 tablet (500 mg total) by mouth daily. 90 tablet 4   vitamin E 180 MG (400 UNITS) capsule Take 1 capsule (400 Units total) by mouth daily.     diazepam (VALIUM) 5 MG tablet Place 1 tablet vaginally nightly as needed for muscle spasm/ pelvic pain. (Patient not taking: Reported on 03/12/2021) 10 tablet 0   estradiol (ESTRACE) 0.1 MG/GM vaginal cream Place 0.5g nightly for two weeks then twice a week after (Patient not taking: Reported on 03/12/2021) 30 g 11   vitamin B-12 (CYANOCOBALAMIN) 1000 MCG tablet Take 1,000 mcg by mouth daily. (Patient not taking: Reported on 03/12/2021)     No current facility-administered medications for this visit.    Family History  Problem  Relation Age of Onset   Diabetes Mother    COPD Mother    Congestive Heart Failure Mother    Thyroid disease Mother    Hemochromatosis Father    Alcoholism Father    Diabetes Sister    Asthma Sister    Asthma Brother    Diabetes Maternal Aunt    Breast cancer Maternal Aunt    Diabetes Other     Review of Systems  All other systems reviewed and are negative.  Exam:   BP 115/84 (BP Location: Right Arm, Patient Position: Sitting, Cuff Size: Large)    Pulse 92    Ht 5\' 9"  (1.753 m) Comment: reported   Wt 212 lb 9.6 oz (96.4 kg)    LMP  (LMP Unknown) Comment: patient had spotting in October 2020   BMI 31.40 kg/m   Height: 5\' 9"  (175.3 cm) (reported)  General appearance: alert, cooperative and appears stated age Head: Normocephalic, without obvious abnormality, atraumatic Neck: no adenopathy, supple, symmetrical, trachea midline and thyroid normal to inspection and palpation Lungs: clear to auscultation bilaterally Breasts: normal appearance, no masses or tenderness Heart: regular rate and rhythm Abdomen: soft, non-tender; bowel sounds normal; no masses,  no organomegaly Extremities: extremities normal, atraumatic, no cyanosis or edema Skin: Skin color, texture, turgor normal. No rashes or lesions Lymph nodes: Cervical, supraclavicular, and axillary nodes normal. No abnormal inguinal nodes palpated Neurologic: Grossly normal   Pelvic: External genitalia:  no lesions              Urethra:  normal appearing urethra with no masses, tenderness or lesions              Bartholins and Skenes: normal                 Vagina: normal appearing vagina with normal color and no discharge, no lesions              Cervix: no lesions              Pap taken: Yes.   Bimanual Exam:  Uterus:  normal size, contour, position, consistency, mobility, non-tender              Adnexa: normal adnexa and no mass, fullness, tenderness               Rectovaginal: Confirms               Anus:  normal sphincter  tone, no lesions  Chaperone, Octaviano Batty, CMA, was present for exam.  Assessment/Plan: 1. Well woman exam with routine gynecological exam - Pap and HR HPV obtained today - MMG 03/2020 - colonoscopy 2017, follow up 10 years - lab work done with PCP - vaccines reviewed and updated  2. Lichen  sclerosus - followed by Dr. Martin Majestic.  On Betamethasone weekly.   3. HSV (herpes simplex virus) infection - RF for Valtrex done today  4. Cervical cancer screening - Cytology - PAP( [place])  5. Family history of breast cancer (in maternal aunt)  57. MGUS (monoclonal gammopathy of unknown significance) - going to follow up with Dr. Lorenso Courier after Dr. Jana Hakim retired  7. 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 Vaginal atrophy - recommended using vaginal estrogen cream once or twice weekly.  Does not need RF right now.

## 2021-03-16 LAB — CYTOLOGY - PAP
Comment: NEGATIVE
Diagnosis: NEGATIVE
High risk HPV: NEGATIVE

## 2021-04-06 ENCOUNTER — Other Ambulatory Visit: Payer: Self-pay | Admitting: Obstetrics & Gynecology

## 2021-04-06 DIAGNOSIS — Z1231 Encounter for screening mammogram for malignant neoplasm of breast: Secondary | ICD-10-CM

## 2021-04-17 ENCOUNTER — Encounter (HOSPITAL_COMMUNITY): Payer: Self-pay | Admitting: Emergency Medicine

## 2021-04-17 ENCOUNTER — Other Ambulatory Visit: Payer: Self-pay

## 2021-04-17 ENCOUNTER — Emergency Department (HOSPITAL_COMMUNITY): Payer: BC Managed Care – PPO

## 2021-04-17 ENCOUNTER — Emergency Department (HOSPITAL_COMMUNITY)
Admission: EM | Admit: 2021-04-17 | Discharge: 2021-04-18 | Disposition: A | Discharge status: Home / Self Care | Payer: BC Managed Care – PPO | Attending: Emergency Medicine | Admitting: Emergency Medicine

## 2021-04-17 DIAGNOSIS — R519 Headache, unspecified: Secondary | ICD-10-CM | POA: Insufficient documentation

## 2021-04-17 DIAGNOSIS — R079 Chest pain, unspecified: Secondary | ICD-10-CM | POA: Diagnosis present

## 2021-04-17 LAB — CBC
HCT: 43.9 % (ref 36.0–46.0)
Hemoglobin: 14.5 g/dL (ref 12.0–15.0)
MCH: 31.3 pg (ref 26.0–34.0)
MCHC: 33 g/dL (ref 30.0–36.0)
MCV: 94.6 fL (ref 80.0–100.0)
Platelets: 323 10*3/uL (ref 150–400)
RBC: 4.64 MIL/uL (ref 3.87–5.11)
RDW: 14.5 % (ref 11.5–15.5)
WBC: 12.5 10*3/uL — ABNORMAL HIGH (ref 4.0–10.5)
nRBC: 0 % (ref 0.0–0.2)

## 2021-04-17 LAB — BASIC METABOLIC PANEL
Anion gap: 11 (ref 5–15)
BUN: 12 mg/dL (ref 6–20)
CO2: 21 mmol/L — ABNORMAL LOW (ref 22–32)
Calcium: 9.8 mg/dL (ref 8.9–10.3)
Chloride: 105 mmol/L (ref 98–111)
Creatinine, Ser: 1.05 mg/dL — ABNORMAL HIGH (ref 0.44–1.00)
GFR, Estimated: >60 mL/min (ref >60)
Glucose, Bld: 96 mg/dL (ref 70–99)
Potassium: 3.8 mmol/L (ref 3.5–5.1)
Sodium: 137 mmol/L (ref 135–145)

## 2021-04-17 LAB — TROPONIN I (HIGH SENSITIVITY): Troponin I (High Sensitivity): 4 ng/L (ref <18)

## 2021-04-17 NOTE — ED Triage Notes (Signed)
Pt c/o left-sided headache "bursts" and left facial and hand numbness that started at 2pm today.Pt also c/o chest tightness x 2 weeks, worsening today. No other neuro deficits noted at this time.  ?

## 2021-04-18 NOTE — ED Provider Notes (Signed)
?[facility] ?Provider Note ? ? ?CSN: [id number] ?Arrival date & time: 04/17/21  2230 ? ?  ? ?History ? ?Chief Complaint  ?Patient presents with  ? Chest Pain  ? ? ?Gwendolyn Ramos is a 56 y.o. female. ? ? ?Chest Pain ?Pain location:  L chest and R chest ?Pain quality: aching and dull   ?Pain radiates to:  Does not radiate ?Pain severity:  Mild ?Timing:  Constant ?Chronicity:  New ?Relieved by:  None tried ?Worsened by:  Nothing ?Ineffective treatments:  None tried ? ?  ? ?Home Medications ?Prior to Admission medications   ?Medication Sig Start Date End Date Taking? Authorizing Provider  ?betamethasone dipropionate 0.05 % cream Apply topically 2 (two) times daily.    [provider]  ?buPROPion (WELLBUTRIN SR) 150 MG 12 hr tablet Take 150 mg by mouth 2 (two) times daily.    [provider]  ?diazepam (VALIUM) 5 MG tablet Place 1 tablet vaginally nightly as needed for muscle spasm/ pelvic pain. ?Patient not taking: Reported on 03/12/2021 02/04/21   Jaquita Folds, MD  ?estradiol (ESTRACE) 0.1 MG/GM vaginal cream Place 0.5g nightly for two weeks then twice a week after ?Patient not taking: Reported on 03/12/2021 11/25/20   Jaquita Folds, MD  ?Multiple Vitamins-Minerals (MULTIVITAMIN WITH MINERALS) tablet Take 1 tablet by mouth daily. 01/05/21   Magrinat, Virgie Dad, MD  ?omeprazole (PRILOSEC) 20 MG capsule Take 20 mg by mouth daily.    [provider]  ?topiramate (TOPAMAX) 25 MG tablet TAKE 3 TABLETS BY MOUTH AT BEDTIME. 10/29/20   Pieter Partridge, DO  ?UBRELVY 100 MG TABS TAKE 1 TABLET BY MOUTH AS NEEDED (MAY REPEAT IN 2 HOURS IF NEEDED. MAXIMUM 2 TABLETS IN 24 HOURS.). 01/05/21   Pieter Partridge, DO  ?valACYclovir (VALTREX) 500 MG tablet Take 1 tablet (500 mg total) by mouth daily. 03/12/21   Megan Salon, MD  ?vitamin B-12 (CYANOCOBALAMIN) 1000 MCG tablet Take 1,000 mcg by mouth daily. ?Patient not taking: Reported on 03/12/2021     [provider]  ?vitamin E 180 MG (400 UNITS) capsule Take 1 capsule (400 Units total) by mouth daily. 01/05/21   Magrinat, Virgie Dad, MD  ?   ? ?Allergies    ?Levaquin [levofloxacin] and Methylchloroisothiazolinone [methylisothiazolinone]   ? ?Review of Systems   ?Review of Systems  ?Cardiovascular:  Positive for chest pain.  ? ?Physical Exam ?Updated Vital Signs ?BP 113/66   Pulse 71   Temp 97.8 ?F (36.6 ?C)   Resp 13   LMP  (LMP Unknown) Comment: patient had spotting in October 2020  SpO2 97%  ?Physical Exam ?Vitals and nursing note reviewed.  ?Constitutional:   ?   Appearance: She is well-developed.  ?HENT:  ?   Head: Normocephalic and atraumatic.  ?Cardiovascular:  ?   Rate and Rhythm: Normal rate and regular rhythm.  ?Pulmonary:  ?   Effort: No respiratory distress.  ?   Breath sounds: No stridor.  ?Abdominal:  ?   General: There is no distension.  ?   Palpations: Abdomen is soft.  ?Musculoskeletal:  ?   Cervical back: Normal range of motion.  ?   Right lower leg: No edema.  ?   Left lower leg: No edema.  ?Skin: ?   General: Skin is warm and dry.  ?Neurological:  ?   Mental Status: She is alert.  ?   Comments: No altered mental status, able to give full  seemingly accurate history.  ?Face is symmetric, EOM's intact, pupils equal and reactive, vision intact, tongue and uvula midline without deviation. ?Upper and Lower extremity motor 5/5, intact pain perception in distal extremities, 2+ reflexes in biceps, patella and achilles tendons. Able to perform finger to nose normal with both hands. Walks without assistance or evident ataxia.  ?  ? ? ?ED Results / Procedures / Treatments   ?Labs ?(all labs ordered are listed, but only abnormal results are displayed) ?Labs Reviewed  ?BASIC METABOLIC PANEL - Abnormal; Notable for the following components:  ?    Result Value  ? CO2 21 (*)   ? Creatinine, Ser 1.05 (*)   ? All other components within normal limits  ?CBC - Abnormal; Notable for the following  components:  ? WBC 12.5 (*)   ? All other components within normal limits  ?TROPONIN I (HIGH SENSITIVITY)  ?TROPONIN I (HIGH SENSITIVITY)  ? ? ?EKG ?EKG Interpretation ? ?Date/Time:  Saturday April 17 2021 22:36:00 EDT ?Ventricular Rate:  76 ?PR Interval:  134 ?QRS Duration: 84 ?QT Interval:  390 ?QTC Calculation: 438 ?R Axis:   78 ?Text Interpretation: Normal sinus rhythm Normal ECG No previous ECGs available Confirmed by Merrily Pew [id number]) on 04/18/2021 1:19:32 AM ? ?Radiology ?DG Chest 2 View ? ?Result Date: 04/17/2021 ?CLINICAL DATA:  Chest pain. EXAM: CHEST - 2 VIEW COMPARISON:  None. FINDINGS: The heart size and mediastinal contours are within normal limits. Both lungs are clear. The visualized skeletal structures are unremarkable. IMPRESSION: No active cardiopulmonary disease. Electronically Signed   By: Anner Crete M.D.   On: 04/17/2021 23:07   ? ?Procedures ?Procedures  ? ? ?Medications Ordered in ED ?Medications - No data to display ? ?ED Course/ Medical Decision Making/ A&P ?  ?                        ?Medical Decision Making ?Amount and/or Complexity of Data Reviewed ?Labs: ordered. ?Radiology: ordered. ? ? ?Patient with short bursts of pain in her head that are different than previous headaches and that they are not quite as long.  She also had some chest pain intermittently over the last few weeks but not currently.  States she might have a bit worse shortness of breath when exerting yourself as well.  Discussed CTA of head neck versus MRI versus following up with neurology she prefers to follow-up with neurology at this time.  I have low suspicion for ruptured aneurysm versus acute stroke I think that is appropriate.  Patient also defers any further work-up for the chest pain since her troponins and EKG here are reassuring she is not having active symptoms.  Patient will follow-up with her PCP for same. ? ? ?  ? ? ? ?Final Clinical Impression(s) / ED Diagnoses ?Final diagnoses:  ?Nonspecific  chest pain  ?Nonintractable headache, unspecified chronicity pattern, unspecified headache type  ? ? ?Rx / DC Orders ?ED Discharge Orders   ? ? None  ? ?  ? ? ?  ?Merrily Pew, MD ?04/19/21 6045 ? ?

## 2021-04-27 ENCOUNTER — Ambulatory Visit: Payer: BC Managed Care – PPO

## 2021-04-27 ENCOUNTER — Other Ambulatory Visit: Payer: Self-pay | Admitting: Neurology

## 2021-04-27 ENCOUNTER — Ambulatory Visit
Admission: RE | Admit: 2021-04-27 | Discharge: 2021-04-27 | Disposition: A | Discharge status: Home / Self Care | Payer: BC Managed Care – PPO | Source: Ambulatory Visit | Attending: Obstetrics & Gynecology | Admitting: Obstetrics & Gynecology

## 2021-04-27 DIAGNOSIS — Z1231 Encounter for screening mammogram for malignant neoplasm of breast: Secondary | ICD-10-CM

## 2021-04-27 NOTE — Telephone Encounter (Signed)
Enough given only until 05/10/2021. No further refills given until seen ?

## 2021-04-28 ENCOUNTER — Other Ambulatory Visit: Payer: Self-pay

## 2021-04-28 ENCOUNTER — Ambulatory Visit (INDEPENDENT_AMBULATORY_CARE_PROVIDER_SITE_OTHER): Payer: Self-pay | Admitting: Plastic Surgery

## 2021-04-28 DIAGNOSIS — Z411 Encounter for cosmetic surgery: Secondary | ICD-10-CM

## 2021-04-28 NOTE — Progress Notes (Signed)
Patient presents to discuss Botox treatment.  We did this a few months back and used 40 units in the forehead, glabella and crows feet.  She is like the result and is interested in another treatment similar to that.  We reviewed the risks and benefits of Botox treatment and she is interested in moving forward.  The forehead, glabella and crows feet were prepped and alcohol pad.  40 units of Botox were distributed throughout using 3 injection points for the crows feet and 2 rows for the forehead.  She tolerated this fine.  She also asked about breast reduction and we talked about it briefly and can set up a formal consult for that if she is interested in moving forward with that.  All her questions were answered. ?

## 2021-05-05 NOTE — Addendum Note (Signed)
Addended by: Threasa Beards K on: 05/05/2021 12:09 PM ? ? Modules accepted: Orders ? ?

## 2021-05-07 NOTE — Progress Notes (Signed)
? ?NEUROLOGY FOLLOW UP OFFICE NOTE ? ?Gwendolyn Ramos ?[id number] ? ?Assessment/Plan:  ? ?Migraine without aura, without status migrainosus, not intractable ?New onset paroxysmal headaches with left facial numbness - unclear etiology ?Cervicalgia - source for her headaches as well ?  ?1.  Check MRI/MRA brain to evaluate new headache with facial numbness ?Migraine prevention:  Increase topiramate to '100mg'$  at bedtime ?2.  Migraine rescue:  [name] '100mg'$  ?3.  Limit use of pain relievers to no more than 2 days out of week to prevent risk of rebound or medication-overuse headache. ?4.  Keep headache diary ?5.  Refer to [facility] for neck pain ?6.  Follow up 6 months. ?  ?Subjective:  ?Gwendolyn Ramos is a 56 year old female with MGUS who follows up for migraines. ?  ?UPDATE: ?On 04/17/2021, she had a new type of headache.  She describes a 7/10 searing paroxysmal pain on left greater than right side of head.  She had associated persistent left sided facial numbness as well.  She went to the ED where brain imaging was suggested but she decided to leave.  It lasted an entire day.  She always has a dull head pressure but she had no headache at all for 3 days.   ? ?In addition, she has a headache 2-3 days a week for which she takes ibuprofen or Tylenol ? ?Migraines are more manageable. ?Intensity:  Moderate (rarely severe) ?Duration:  If [name] taken immediately, then quickly, otherwise may need to repeat dose and aborts in 2 hours. ?Frequency:  2 a month ?Frequency of abortive medication: no other analgesics/NSAIDs or triptans ?Current NSAIDS:  none ?Current analgesics:  none ?Current triptans:  none ?Current ergotamine:  none ?Current anti-emetic:  none ?Current muscle relaxants:  none ?Current anti-anxiolytic:  none ?Current sleep aide:  none ?Current Antihypertensive medications:  none ?Current Antidepressant medications:  Wellbutrin ?Current Anticonvulsant medications:  topiramate '75mg'$  at  bedtime ?Current anti-CGRP:  Ubrelvy '100mg'$  ?Current Vitamins/Herbal/Supplements:  MVI ?Current Antihistamines/Decongesrants:  none ?Other therapy:  Aroma therapy.  She does receive Botox for cosmetic reasons. ?Hormone/birth control:  none ?  ?Caffeine:  1 to 2 cups of coffee daily ?Diet:  At least 64 oz water daily. Does not skip meals ?Exercise:  Routine.  Walk, golf, tennis ?Depression:  Yes but stable; Anxiety:  no ?Other pain:  no ?Sleep hygiene:  good ?  ?HISTORY:  ?Onset:  High school and college ?Location:  1.  Diffuse; 2.  Unilateral either side; 3.  Bilateral retro-orbital.  Sometimes associated neck pain. ?Quality:  1.  Pressure; 2.  Throbbing; 3.  Stabbing ?Initial Intensity:  1.  Mild-moderate; 2.  Severe; 3.  Severe.  She denies new headache, thunderclap headache or severe headache that wakes her from sleep. ?Aura:  no ?Associated symptoms:  Photophobia, phonophobia, osmophobia, dizziness, sometimes blurred vision.  No recent nausea or vomiting.  She denies associated unilateral numbness or weakness. ?Initial Duration:  Hour to all day ?Initial Frequency:  Daily headache (severe headaches occur 1 to 2 times a month) ?Frequency of abortive medication: She pre-treats with Aleve to prevent progression into migraine.  Takes Aleve 4 to 5 days a week. ?Triggers:  Valsalva, hitting head, menopause; neck pain; perfumes or gasoline, hunger, sugar/sweets ?Relieving factors:  Aroma therapy; hot shower ?Activity:  Aggravates. ?  ?  ?Past NSAIDS:  Advil, Aleve (upsets stomach) ?Past analgesics:  Tylenol with codeine; Tylenol ?Past abortive triptans:  Imitrex, Maxalt ?Past abortive ergotamine:  none ?Past muscle relaxants:  Soma; Flexeril ?Past anti-emetic:  none ?Past antihypertensive medications:  none ?Past antidepressant medications:  none ?Past anticonvulsant medications:  none ?Past anti-CGRP:  none ?Past vitamins/Herbal/Supplements:  none ?Past antihistamines/decongestants:  none ?Other past therapies:  none ?   ?  ?Family history of headache:  Mother (migraines); daughter (migraines) ? ?PAST MEDICAL HISTORY: ?Past Medical History:  ?Diagnosis Date  ? Anxiety   ? COVID-19 08/2020  ? mild symptoms, no treatment, all symptoms resolved as of 10/21/20 per patient  ? Depression   ? Eczema of hand   ? both hands  ? GERD (gastroesophageal reflux disease)   ? 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 followed by dr Jana Hakim (oncology/ hemotology)  hereditary ,  pt was tested due to her father had the mutation  ? History of toxic shock syndrome   ? age 73  ? HSV-1 infection   ? IBS (irritable bowel syndrome)   ? Lichen sclerosus   ? MGUS (monoclonal gammopathy of unknown significance)   ? IgG Kappa  ? Migraines   ? Pt states she normally has 1-2 migraines per month. 10/21/20  ? ? ?MEDICATIONS: ?Current Outpatient Medications on File Prior to Visit  ?Medication Sig Dispense Refill  ? betamethasone dipropionate 0.05 % cream Apply topically 2 (two) times daily.    ? buPROPion (WELLBUTRIN SR) 150 MG 12 hr tablet Take 150 mg by mouth 2 (two) times daily.    ? estradiol (ESTRACE) 0.1 MG/GM vaginal cream Place 0.5g nightly for two weeks then twice a week after 30 g 11  ? Multiple Vitamins-Minerals (MULTIVITAMIN WITH MINERALS) tablet Take 1 tablet by mouth daily.    ? omeprazole (PRILOSEC) 20 MG capsule Take 20 mg by mouth daily.    ? topiramate (TOPAMAX) 25 MG tablet TAKE 3 TABLETS BY MOUTH AT BEDTIME 14 tablet 0  ? UBRELVY 100 MG TABS TAKE 1 TABLET BY MOUTH AS NEEDED (MAY REPEAT IN 2 HOURS IF NEEDED. MAXIMUM 2 TABLETS IN 24 HOURS.). 10 tablet 3  ? valACYclovir (VALTREX) 500 MG tablet Take 1 tablet (500 mg total) by mouth daily. 90 tablet 4  ? vitamin B-12 (CYANOCOBALAMIN) 1000 MCG tablet Take 1,000 mcg by mouth daily.    ? vitamin E 180 MG (400 UNITS) capsule Take 1 capsule (400 Units total) by mouth daily.    ? ?No current facility-administered medications on file prior to visit.   ? ? ?ALLERGIES: ?Allergies  ?Allergen Reactions  ? Levaquin [Levofloxacin]   ?  High fever, elevated heart rate  ? Methylchloroisothiazolinone [Methylisothiazolinone]   ?  In liquid soaps causes skin irritation.  ? ? ?FAMILY HISTORY: ?Family History  ?Problem Relation Age of Onset  ? Diabetes Mother   ? COPD Mother   ? Congestive Heart Failure Mother   ? Thyroid disease Mother   ? Hemochromatosis Father   ? Alcoholism Father   ? Diabetes Sister   ? Asthma Sister   ? Asthma Brother   ? Diabetes Maternal Aunt   ? Breast cancer Maternal Aunt   ? Diabetes Other   ? ? ?  ?Objective:  ?Blood pressure 127/78, pulse 92, height '5\' 9"'$  (1.753 m), weight 214 lb (97.1 kg), SpO2 95 %. ?General: No acute distress.  Patient appears well-groomed.   ?Head:  Normocephalic/atraumatic ?Eyes:  Fundi examined but not visualized ?Neurological Exam: alert and oriented to person, place, and time.  Speech fluent and not dysarthric, language intact.  Slightly altered sensation left V1.  Otherwise, CN II-XII intact. Bulk and tone normal, muscle strength 5/5 throughout.  Sensation to light touch intact.  Deep tendon reflexes 2+ throughout.  Finger to nose testing intact.  Gait normal, Romberg negative. ? ? ?Metta Clines, DO ? ?CC: [name], MD ? ? ? ? ? ? ?

## 2021-05-10 ENCOUNTER — Ambulatory Visit: Payer: BC Managed Care – PPO | Admitting: Neurology

## 2021-05-10 ENCOUNTER — Encounter: Payer: Self-pay | Admitting: Neurology

## 2021-05-10 VITALS — BP 127/78 | HR 92 | Ht 69.0 in | Wt 214.0 lb

## 2021-05-10 DIAGNOSIS — M542 Cervicalgia: Secondary | ICD-10-CM

## 2021-05-10 DIAGNOSIS — R519 Headache, unspecified: Secondary | ICD-10-CM

## 2021-05-10 DIAGNOSIS — G43009 Migraine without aura, not intractable, without status migrainosus: Secondary | ICD-10-CM | POA: Diagnosis not present

## 2021-05-10 MED ORDER — TOPIRAMATE 100 MG PO TABS: 100.0000 mg | ORAL_TABLET | Freq: Every day | ORAL | 5 refills | Status: DC

## 2021-05-10 NOTE — Patient Instructions (Addendum)
?  Increase topiramate to '100mg'$  at bedtime ?[name] as needed for migraine ?Refer to Dr. Hulan Saas of [place] for neck pain ?Limit use of pain relievers to no more than 2 days out of week to prevent risk of rebound or medication-overuse headache. ?Keep headache diary ?MRI/MRA of head  ?Follow up 6 months. ?

## 2021-05-19 ENCOUNTER — Ambulatory Visit
Admission: RE | Admit: 2021-05-19 | Discharge: 2021-05-19 | Disposition: A | Discharge status: Home / Self Care | Payer: BC Managed Care – PPO | Source: Ambulatory Visit | Attending: Neurology | Admitting: Neurology

## 2021-05-19 DIAGNOSIS — R519 Headache, unspecified: Secondary | ICD-10-CM

## 2021-06-02 ENCOUNTER — Encounter: Payer: Self-pay | Admitting: Obstetrics and Gynecology

## 2021-06-09 NOTE — Progress Notes (Signed)
Gwendolyn Ramos Phone: [phone] Subjective:   Gwendolyn Ramos, am serving as a scribe for Dr. Hulan Saas.  This visit occurred during the SARS-CoV-2 public health emergency.  Safety protocols were in place, including screening questions prior to the visit, additional usage of staff PPE, and extensive cleaning of exam room while observing appropriate contact time as indicated for disinfecting solutions.    I'm seeing this patient by the request  of:  [name], MD  CC: Neck and upper back pain  OAC:[id number]  Gwendolyn Ramos is a 56 y.o. female coming in with complaint of chronic neck and upper back pain. Sees Dr. Tomi Likens for migraines. Patient states that pain is in B shoulders. If she wears a sports bra she will get a headache. Also complains of spasming of  muscles in thoracic spine that will cause her to have SOB. Does feel like her breast tissue contributes to tension in upper back. Can have a headache daily if she wears her sports bra. Otherwise she has headaches twice a week. Uses heat for pain relief. Recently increased Topamax and this seems to be helping.       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 10/21/20   Past Surgical History:  Procedure  Laterality Date   BLADDER SUSPENSION N/A 10/26/2020   Procedure: TRANSVAGINAL TAPE (TVT) PROCEDURE;  Surgeon: Jaquita Folds, MD;  [facility]: [facility];  Service: Gynecology;  Laterality: N/A;   BUNIONECTOMY Bilateral    CHOLECYSTECTOMY  2001   CYSTOSCOPY N/A 10/26/2020   Procedure: CYSTOSCOPY;  Surgeon: Jaquita Folds, MD;  Location: [facility];  Service: Gynecology;  Laterality: N/A;   NASAL SEPTUM SURGERY     RECTOCELE REPAIR N/A 10/26/2020   Procedure: POSTERIOR REPAIR (RECTOCELE) with perineorrhaphy;  Surgeon: Jaquita Folds, MD;  Location: [facility];  Service: Gynecology;  Laterality: N/A;  total time requested for all procedures is 1.5 hours   TONSILLECTOMY     Social History   Socioeconomic History   Marital status: Married    Spouse name: Not on file   Number of children: Not on file   Years of education: Not on file   Highest education level: Not on file  Occupational History   Not on file  Tobacco Use   Smoking status: Never   Smokeless tobacco: Never  Vaping Use   Vaping Use: Never used  Substance and Sexual Activity   Alcohol use: Yes    Alcohol/week: 3.0 standard drinks    Types: 3 Glasses of wine per week   Drug use: Never   Sexual activity: Not Currently  Birth control/protection: None  Other Topics Concern   Not on file  Social History Narrative   Right handed    Two story home   Drinks caffeine   Social Determinants of Health   Financial Resource Strain: Not on file  Food Insecurity: Not on file  Transportation Needs: Not on file  Physical Activity: Not on file  Stress: Not on file  Social Connections: Not on file   Allergies  Allergen Reactions   Levaquin [Levofloxacin]     High fever, elevated heart rate   Methylchloroisothiazolinone [Methylisothiazolinone]     In liquid soaps causes skin irritation.   Family History  Problem Relation Age of Onset   Diabetes Mother     COPD Mother    Congestive Heart Failure Mother    Thyroid disease Mother    Hemochromatosis Father    Alcoholism Father    Diabetes Sister    Asthma Sister    Asthma Brother    Diabetes Maternal Aunt    Breast cancer Maternal Aunt    Diabetes Other        Current Outpatient Medications (Analgesics):    UBRELVY 100 MG TABS, TAKE 1 TABLET BY MOUTH AS NEEDED (MAY REPEAT IN 2 HOURS IF NEEDED. MAXIMUM 2 TABLETS IN 24 HOURS.).   Current Outpatient Medications (Other):    betamethasone dipropionate 0.05 % cream, Apply topically 2 (two) times daily.   buPROPion (WELLBUTRIN SR) 150 MG 12 hr tablet, Take 150 mg by mouth 2 (two) times daily.   Multiple Vitamins-Minerals (MULTIVITAMIN WITH MINERALS) tablet, Take 1 tablet by mouth daily.   omeprazole (PRILOSEC) 20 MG capsule, Take 20 mg by mouth daily.   topiramate (TOPAMAX) 100 MG tablet, Take 1 tablet (100 mg total) by mouth at bedtime.   valACYclovir (VALTREX) 500 MG tablet, Take 1 tablet (500 mg total) by mouth daily.   vitamin E 180 MG (400 UNITS) capsule, Take 1 capsule (400 Units total) by mouth daily.   Reviewed prior external information including notes and imaging from  primary care provider As well as notes that were available from care everywhere and other healthcare systems.  Past medical history, social, surgical and family history all reviewed in electronic medical record.  Ramos pertanent information unless stated regarding to the chief complaint.   Review of Systems:  Ramos  visual changes, nausea, vomiting, diarrhea, constipation, dizziness, abdominal pain, skin rash, fevers, chills, night sweats, weight loss, swollen lymph nodes, body aches, joint swelling, chest pain, shortness of breath, mood changes. POSITIVE muscle aches, headache  Objective  Blood pressure 108/78, pulse 73, height '5\' 9"'$  (1.753 m), weight 211 lb (95.7 kg), SpO2 97 %.   General: Ramos apparent distress alert and oriented x3 mood and affect normal, dressed  appropriately.  HEENT: Pupils equal, extraocular movements intact  Respiratory: Patient's speak in full sentences and does not appear short of breath  Cardiovascular: Ramos lower extremity edema, non tender, Ramos erythema  Gait normal with good balance and coordination.  MSK: Neck exam does have some mild loss of lordosis.  Patient does have large breast tissue.  Patient does have anterior retraction of the shoulders bilaterally.  Patient does have some limited sidebending bilaterally.  Significant tightness noted in the occipital region of the neck.   Osteopathic findings C2 flexed rotated and side bent right C4 flexed rotated and side bent left C6 flexed rotated and side bent left T3 extended rotated and side bent right inhaled third rib    97110; 15 additional  minutes spent for Therapeutic exercises as stated in above notes.  This included exercises focusing on stretching, strengthening, with significant focus on eccentric aspects.   Long term goals include an improvement in range of motion, strength, endurance as well as avoiding reinjury. Patient's frequency would include in 1-2 times a day, 3-5 times a week for a duration of 6-12 weeks. Exercises that included:  Basic scapular stabilization to include adduction and depression of scapula Scaption, focusing on proper movement and good control Internal and External rotation utilizing a theraband, with elbow tucked at side entire time Rows with theraband    Proper technique shown and discussed handout in great detail with ATC.  All questions were discussed and answered.     Impression and Recommendations:     The above documentation has been reviewed and is accurate and complete Lyndal Pulley, DO

## 2021-06-10 ENCOUNTER — Ambulatory Visit: Payer: BC Managed Care – PPO | Admitting: Family Medicine

## 2021-06-10 ENCOUNTER — Encounter: Payer: Self-pay | Admitting: Family Medicine

## 2021-06-10 VITALS — BP 108/78 | HR 73 | Ht 69.0 in | Wt 211.0 lb

## 2021-06-10 DIAGNOSIS — G4486 Cervicogenic headache: Secondary | ICD-10-CM | POA: Diagnosis not present

## 2021-06-10 DIAGNOSIS — M9902 Segmental and somatic dysfunction of thoracic region: Secondary | ICD-10-CM | POA: Diagnosis not present

## 2021-06-10 DIAGNOSIS — M9901 Segmental and somatic dysfunction of cervical region: Secondary | ICD-10-CM | POA: Diagnosis not present

## 2021-06-10 DIAGNOSIS — N62 Hypertrophy of breast: Secondary | ICD-10-CM

## 2021-06-10 DIAGNOSIS — N6459 Other signs and symptoms in breast: Secondary | ICD-10-CM

## 2021-06-10 DIAGNOSIS — M9908 Segmental and somatic dysfunction of rib cage: Secondary | ICD-10-CM

## 2021-06-10 HISTORY — DX: Segmental and somatic dysfunction of cervical region: M99.01

## 2021-06-10 HISTORY — DX: Cervicogenic headache: G44.86

## 2021-06-10 NOTE — Assessment & Plan Note (Signed)
Significant amount of breast tissue that could be contributing to patient's discomfort of the neck as well.  Will refer to plastics for follow-up to see if patient would be a candidate for possible surgical intervention.

## 2021-06-10 NOTE — Assessment & Plan Note (Signed)
Patient does have a multifactorial headaches that do think are cervicogenic in nature.  Patient also has hereditary hemochromatosis as well as MGUS.  This could potentially cause more inflammation as well.  Discussed with patient about over-the-counter medications, patient also does have moderate amount of breast tissue that could also be contributing.  Discussed posture and ergonomic exercises.  Attempted osteopathic manipulation.  Hold on any other medications at this moment.  Follow-up with me again in 6 to 8 weeks.

## 2021-06-10 NOTE — Patient Instructions (Signed)
Exercises Tart cherry extract '1200mg'$  at night Dr. Claudia Desanctis will call you See me again in 5-6 weeks

## 2021-06-10 NOTE — Assessment & Plan Note (Signed)
   Decision today to treat with OMT was based on Physical Exam  After verbal consent patient was treated with HVLA, ME, FPR techniques in cervical, thoracic, rib,areas, all areas are chronic   Patient tolerated the procedure well with improvement in symptoms  Patient given exercises, stretches and lifestyle modifications  See medications in patient instructions if given  Patient will follow up in 4-8 weeks 

## 2021-06-23 ENCOUNTER — Encounter: Payer: Self-pay | Admitting: Obstetrics and Gynecology

## 2021-06-23 ENCOUNTER — Ambulatory Visit: Payer: BC Managed Care – PPO | Admitting: Obstetrics and Gynecology

## 2021-06-23 VITALS — BP 118/79 | HR 80 | Ht 69.0 in | Wt 209.0 lb

## 2021-06-23 DIAGNOSIS — K5904 Chronic idiopathic constipation: Secondary | ICD-10-CM | POA: Diagnosis not present

## 2021-06-23 DIAGNOSIS — N952 Postmenopausal atrophic vaginitis: Secondary | ICD-10-CM

## 2021-06-23 NOTE — Progress Notes (Signed)
Gwendolyn Ramos  SUBJECTIVE  History of Present Illness: Gwendolyn Ramos is a 56 y.o. female seen in follow-up prolapse. She is s/p Posterior repair and perineorrhaphy, midurethral sling and cystoscopy on 10/26/20   Has been exercising regularly and working with a trainer. At one point, she deadlifted around 165lbs. After that time, she felt a lot of pressure in the pelvic/ vaginal area and was concerned that she caused the prolapse to return. Not really feeling any pressure vaginally today.   No urine leakage at all with exercise. Rare leakage- only a small amount if it happens.   Pain with intercourse has improved. Using the estrogen cream about once a week.   Bowel movements have mostly been regular. She stopped taking the stool softeners. Has had some constipation recently with traveling.   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   Past Surgical History: She  has a past surgical history that includes Bunionectomy (Bilateral); Tonsillectomy; Nasal septum surgery; Cholecystectomy (2001); Rectocele repair (N/A, 10/26/2020); Bladder suspension (N/A, 10/26/2020); and Cystoscopy (N/A, 10/26/2020).   Medications: She has a current medication list which includes the following prescription(s): betamethasone dipropionate, bupropion, multivitamin with minerals, omeprazole, topiramate, ubrelvy, valacyclovir, and vitamin e.   Allergies: Patient is allergic to levaquin [levofloxacin] and methylchloroisothiazolinone [methylisothiazolinone].   Social History: Patient  reports that she has never smoked. She has  never used smokeless tobacco. She reports current alcohol use of about 3.0 standard drinks per week. She reports that she does not use drugs.      OBJECTIVE     Physical Exam: Vitals:   06/23/21 1304  BP: 118/79  Pulse: 80  Weight: 209 lb (94.8 kg)  Height: '5\' 9"'$  (1.753 m)   Gen: No apparent distress, A&O x 3.  Detailed Urogynecologic Evaluation:  Speculum exam reveals normal mucosa with atrophy. Normal appearing cervix.   POP-Q  -2.5                                            Aa   -2.5                                           Ba  -8.5                                              C   3                                            Gh  4  Pb  10.5                                            tvl   -2                                            Ap  -2                                            Bp  -10                                              D      ASSESSMENT AND PLAN    Ms. Bocchino is a 56 y.o. with:  1. Chronic idiopathic constipation   2. Vaginal atrophy    - Small posterior vaginal bulge present but has not returned to where it was preoperatively (was at the introitus). Would not pursue any treatment at this time.  - We discussed that heavy lifting can cause excess pressure on the pelvis and can contribute to surgical failure. She would modify her exercise either with weight lifted, positioning or repetitions to reduce pelvic/ core pressure. She is working with a Physiological scientist. - Continue vaginal estrogen twice a week - to prevent constipation while traveling, restart stool softener or fiber supplement daily.   Return as needed  Jaquita Folds, MD   Time spent: I spent 20 minutes dedicated to the care of this patient on the date of this encounter to include pre-Ramos review of records, face-to-face time with the patient and post Ramos documentation.

## 2021-06-24 ENCOUNTER — Ambulatory Visit: Payer: BC Managed Care – PPO | Admitting: Plastic Surgery

## 2021-06-24 ENCOUNTER — Encounter: Payer: Self-pay | Admitting: Plastic Surgery

## 2021-06-24 VITALS — BP 126/77 | HR 77 | Ht 69.0 in | Wt 212.8 lb

## 2021-06-24 DIAGNOSIS — M546 Pain in thoracic spine: Secondary | ICD-10-CM | POA: Diagnosis not present

## 2021-06-24 DIAGNOSIS — Z803 Family history of malignant neoplasm of breast: Secondary | ICD-10-CM

## 2021-06-24 DIAGNOSIS — Z411 Encounter for cosmetic surgery: Secondary | ICD-10-CM

## 2021-06-24 DIAGNOSIS — M542 Cervicalgia: Secondary | ICD-10-CM

## 2021-06-24 DIAGNOSIS — M545 Low back pain, unspecified: Secondary | ICD-10-CM

## 2021-06-24 DIAGNOSIS — N62 Hypertrophy of breast: Secondary | ICD-10-CM

## 2021-06-24 DIAGNOSIS — M4004 Postural kyphosis, thoracic region: Secondary | ICD-10-CM

## 2021-06-24 NOTE — Progress Notes (Signed)
Referring Provider [name], MD [address] [place],  [place] [address]   CC:  Chief Complaint  Patient presents with   Advice Only      Gwendolyn Ramos is an 56 y.o. female.  HPI: Patient presents to discuss breast reduction.  She has had years of back pain, neck pain and shoulder plate pain related to her large breast.  She tried over-the-counter medications, warm packs, cold packs and supportive bras with little relief.  She is also been to a sports medicine physician for soft tissue work around the neck and spine and also gets adjustments at a chiropractor.  She also goes to a neurologist regularly for headaches.  She did have a needle biopsy done on the right breast 30 years ago which was benign.  She does not smoke and is not diabetic.  She is up-to-date on her mammograms and they have been normal.  She also wants to discuss her abdominal contour.  She is bothered by excess skin and prominence of her abdomen since having kids.  She would like to see what can be done to correct that.  She has had a laparoscopic cholecystectomy and prolapse surgery in the pelvis.  Allergies  Allergen Reactions   Levaquin [Levofloxacin]     High fever, elevated heart rate   Methylchloroisothiazolinone [Methylisothiazolinone]     In liquid soaps causes skin irritation.    Outpatient Encounter Medications as of 06/24/2021  Medication Sig   betamethasone dipropionate 0.05 % cream Apply topically 2 (two) times daily.   buPROPion (WELLBUTRIN SR) 150 MG 12 hr tablet Take 150 mg by mouth 2 (two) times daily.   Multiple Vitamins-Minerals (MULTIVITAMIN WITH MINERALS) tablet Take 1 tablet by mouth daily.   omeprazole (PRILOSEC) 20 MG capsule Take 20 mg by mouth daily.   topiramate (TOPAMAX) 100 MG tablet Take 1 tablet (100 mg total) by mouth at bedtime.   UBRELVY 100 MG TABS TAKE 1 TABLET BY MOUTH AS NEEDED (MAY REPEAT IN 2 HOURS IF NEEDED. MAXIMUM 2 TABLETS IN 24 HOURS.).    valACYclovir (VALTREX) 500 MG tablet Take 1 tablet (500 mg total) by mouth daily.   vitamin E 180 MG (400 UNITS) capsule Take 1 capsule (400 Units total) by mouth daily.   No facility-administered encounter medications on file as of 06/24/2021.     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 10/21/20    Past Surgical History:  Procedure Laterality Date   BLADDER SUSPENSION N/A 10/26/2020   Procedure: TRANSVAGINAL TAPE (TVT) PROCEDURE;  Surgeon: Jaquita Folds, MD;  [facility];  Service: Gynecology;  Laterality: N/A;   BUNIONECTOMY Bilateral    CHOLECYSTECTOMY  2001  CYSTOSCOPY N/A 10/26/2020   Procedure: CYSTOSCOPY;  Surgeon: Jaquita Folds, MD;  [facility];  Service: Gynecology;  Laterality: N/A;   NASAL SEPTUM SURGERY     RECTOCELE REPAIR N/A 10/26/2020   Procedure: POSTERIOR REPAIR (RECTOCELE) with perineorrhaphy;  Surgeon: Jaquita Folds, MD;  [facility]: [facility];  Service: Gynecology;  Laterality: N/A;  total time requested for all procedures is 1.5 hours   TONSILLECTOMY      Family History  Problem Relation Age of Onset   Diabetes Mother    COPD Mother    Congestive Heart Failure Mother    Thyroid disease Mother    Hemochromatosis Father    Alcoholism  Father    Diabetes Sister    Asthma Sister    Asthma Brother    Diabetes Maternal Aunt    Breast cancer Maternal Aunt    Diabetes Other     Social History   Social History Narrative   Right handed    Two story home   Drinks caffeine     Review of Systems General: Denies fevers, chills, weight loss CV: Denies chest pain, shortness of breath, palpitations  Physical Exam    06/24/2021   12:34 PM 06/23/2021    1:04 PM 06/10/2021    7:41 AM  Vitals with BMI  Height '5\' 9"'$  '5\' 9"'$  '5\' 9"'$   Weight 212 lbs 13 oz 209 lbs 211 lbs  BMI 31.41 48.18 56.31  Systolic 497 026 378  Diastolic 77 79 78  Pulse 77 80 73    General:  No acute distress,  Alert and oriented, Non-Toxic, Normal speech and affect Breast: She has grade 3 ptosis.  Sternal notch to nipple is 32 cm bilaterally.  Nipple to fold is 13 cm bilaterally.  No obvious scars or masses Abdomen: Abdomen is soft nontender.  No obvious hernias.  Well-healed laparoscopic scars.  Mild to moderate excess skin and adipose tissue in the infra and supraumbilical areas.  Likely mild rectus diastases.  Assessment/Plan The patient has bilateral symptomatic macromastia.  She is a good candidate for a breast reduction.  She is interested in pursuing surgical treatment.  She has tried supportive garments and fitted bras with no relief.  The details of breast reduction surgery were discussed.  I explained the procedure in detail along the with the expected scars.  The risks were discussed in detail and include bleeding, infection, damage to surrounding structures, need for additional procedures, nipple loss, change in nipple sensation, persistent pain, contour irregularities and asymmetries.  I explained that breast feeding is often not possible after breast reduction surgery.  We discussed the expected postoperative course with an overall recovery period of about 1 month.  She demonstrated full understanding of all risks.  We discussed her personal risk  factors.  The patient is interested in pursuing surgical treatment.  I anticipate approximately 850g of tissue removed from each side.   Gwendolyn Ramos 06/24/2021, 2:08 PM

## 2021-06-30 ENCOUNTER — Other Ambulatory Visit: Payer: Self-pay | Admitting: *Deleted

## 2021-06-30 ENCOUNTER — Inpatient Hospital Stay: Payer: BC Managed Care – PPO | Attending: Hematology and Oncology

## 2021-06-30 ENCOUNTER — Other Ambulatory Visit: Payer: Self-pay

## 2021-06-30 ENCOUNTER — Inpatient Hospital Stay: Payer: BC Managed Care – PPO

## 2021-06-30 DIAGNOSIS — Z803 Family history of malignant neoplasm of breast: Secondary | ICD-10-CM | POA: Insufficient documentation

## 2021-06-30 DIAGNOSIS — Z836 Family history of other diseases of the respiratory system: Secondary | ICD-10-CM | POA: Insufficient documentation

## 2021-06-30 DIAGNOSIS — Z8616 Personal history of COVID-19: Secondary | ICD-10-CM | POA: Insufficient documentation

## 2021-06-30 DIAGNOSIS — Z79899 Other long term (current) drug therapy: Secondary | ICD-10-CM | POA: Diagnosis not present

## 2021-06-30 DIAGNOSIS — Z881 Allergy status to other antibiotic agents status: Secondary | ICD-10-CM | POA: Diagnosis not present

## 2021-06-30 DIAGNOSIS — Z832 Family history of diseases of the blood and blood-forming organs and certain disorders involving the immune mechanism: Secondary | ICD-10-CM | POA: Diagnosis not present

## 2021-06-30 DIAGNOSIS — D472 Monoclonal gammopathy: Secondary | ICD-10-CM | POA: Insufficient documentation

## 2021-06-30 DIAGNOSIS — Z9049 Acquired absence of other specified parts of digestive tract: Secondary | ICD-10-CM | POA: Insufficient documentation

## 2021-06-30 DIAGNOSIS — Z8249 Family history of ischemic heart disease and other diseases of the circulatory system: Secondary | ICD-10-CM | POA: Insufficient documentation

## 2021-06-30 DIAGNOSIS — K219 Gastro-esophageal reflux disease without esophagitis: Secondary | ICD-10-CM | POA: Diagnosis not present

## 2021-06-30 DIAGNOSIS — Z833 Family history of diabetes mellitus: Secondary | ICD-10-CM | POA: Diagnosis not present

## 2021-06-30 DIAGNOSIS — Z8349 Family history of other endocrine, nutritional and metabolic diseases: Secondary | ICD-10-CM | POA: Insufficient documentation

## 2021-06-30 LAB — CBC WITH DIFFERENTIAL (CANCER CENTER ONLY)
Abs Immature Granulocytes: 0.02 10*3/uL (ref 0.00–0.07)
Basophils Absolute: 0.1 10*3/uL (ref 0.0–0.1)
Basophils Relative: 1 %
Eosinophils Absolute: 0.2 10*3/uL (ref 0.0–0.5)
Eosinophils Relative: 2 %
HCT: 41.9 % (ref 36.0–46.0)
Hemoglobin: 14.2 g/dL (ref 12.0–15.0)
Immature Granulocytes: 0 %
Lymphocytes Relative: 31 %
Lymphs Abs: 2.3 10*3/uL (ref 0.7–4.0)
MCH: 31.6 pg (ref 26.0–34.0)
MCHC: 33.9 g/dL (ref 30.0–36.0)
MCV: 93.1 fL (ref 80.0–100.0)
Monocytes Absolute: 0.7 10*3/uL (ref 0.1–1.0)
Monocytes Relative: 10 %
Neutro Abs: 4.1 10*3/uL (ref 1.7–7.7)
Neutrophils Relative %: 56 %
Platelet Count: 312 10*3/uL (ref 150–400)
RBC: 4.5 MIL/uL (ref 3.87–5.11)
RDW: 13.8 % (ref 11.5–15.5)
WBC Count: 7.4 10*3/uL (ref 4.0–10.5)
nRBC: 0 % (ref 0.0–0.2)

## 2021-06-30 LAB — CMP (CANCER CENTER ONLY)
ALT: 35 U/L (ref 0–44)
AST: 49 U/L — ABNORMAL HIGH (ref 15–41)
Albumin: 4.5 g/dL (ref 3.5–5.0)
Alkaline Phosphatase: 62 U/L (ref 38–126)
Anion gap: 8 (ref 5–15)
BUN: 9 mg/dL (ref 6–20)
CO2: 22 mmol/L (ref 22–32)
Calcium: 10.1 mg/dL (ref 8.9–10.3)
Chloride: 106 mmol/L (ref 98–111)
Creatinine: 1.08 mg/dL — ABNORMAL HIGH (ref 0.44–1.00)
GFR, Estimated: >60 mL/min (ref >60)
Glucose, Bld: 110 mg/dL — ABNORMAL HIGH (ref 70–99)
Potassium: 4.1 mmol/L (ref 3.5–5.1)
Sodium: 136 mmol/L (ref 135–145)
Total Bilirubin: 0.6 mg/dL (ref 0.3–1.2)
Total Protein: 8.5 g/dL — ABNORMAL HIGH (ref 6.5–8.1)

## 2021-06-30 LAB — RETIC PANEL
Immature Retic Fract: 6.9 % (ref 2.3–15.9)
RBC.: 4.55 MIL/uL (ref 3.87–5.11)
Retic Count, Absolute: 58.7 10*3/uL (ref 19.0–186.0)
Retic Ct Pct: 1.3 % (ref 0.4–3.1)
Reticulocyte Hemoglobin: 36.3 pg (ref >27.9)

## 2021-06-30 LAB — FERRITIN: Ferritin: 75 ng/mL (ref 11–307)

## 2021-06-30 LAB — IRON AND IRON BINDING CAPACITY (CC-WL,HP ONLY)
Iron: 111 ug/dL (ref 28–170)
Saturation Ratios: 40 % — ABNORMAL HIGH (ref 10.4–31.8)
TIBC: 277 ug/dL (ref 250–450)
UIBC: 166 ug/dL (ref 148–442)

## 2021-07-01 ENCOUNTER — Encounter: Payer: Self-pay | Admitting: Plastic Surgery

## 2021-07-01 ENCOUNTER — Ambulatory Visit: Payer: BC Managed Care – PPO | Admitting: Plastic Surgery

## 2021-07-01 DIAGNOSIS — N62 Hypertrophy of breast: Secondary | ICD-10-CM

## 2021-07-01 LAB — KAPPA/LAMBDA LIGHT CHAINS
Kappa free light chain: 55.7 mg/L — ABNORMAL HIGH (ref 3.3–19.4)
Kappa, lambda light chain ratio: 6.48 — ABNORMAL HIGH (ref 0.26–1.65)
Lambda free light chains: 8.6 mg/L (ref 5.7–26.3)

## 2021-07-01 NOTE — Progress Notes (Signed)
Patient presents with follow-up questions regarding the plan for upcoming breast reduction along with abdominoplasty.  She is not sure if she wants to wait for her insurance to approve her reduction she wants to go ahead and move forward with the scheduling process whether insurance covers it or not.  We reviewed a number of questions regarding the procedure along with postoperative expectations.  She satisfied with all of her questions and we will move forward with scheduling to do try to get this done for her.

## 2021-07-04 ENCOUNTER — Encounter: Payer: Self-pay | Admitting: *Deleted

## 2021-07-05 LAB — MULTIPLE MYELOMA PANEL, SERUM
Albumin SerPl Elph-Mcnc: 4.2 g/dL (ref 2.9–4.4)
Albumin/Glob SerPl: 1.1 (ref 0.7–1.7)
Alpha 1: 0.2 g/dL (ref 0.0–0.4)
Alpha2 Glob SerPl Elph-Mcnc: 0.7 g/dL (ref 0.4–1.0)
B-Globulin SerPl Elph-Mcnc: 0.9 g/dL (ref 0.7–1.3)
Gamma Glob SerPl Elph-Mcnc: 2.2 g/dL — ABNORMAL HIGH (ref 0.4–1.8)
Globulin, Total: 3.9 g/dL (ref 2.2–3.9)
IgA: 53 mg/dL — ABNORMAL LOW (ref 87–352)
IgG (Immunoglobin G), Serum: 2400 mg/dL — ABNORMAL HIGH (ref 586–1602)
IgM (Immunoglobulin M), Srm: 87 mg/dL (ref 26–217)
M Protein SerPl Elph-Mcnc: 1.7 g/dL — ABNORMAL HIGH
Total Protein ELP: 8.1 g/dL (ref 6.0–8.5)

## 2021-07-07 ENCOUNTER — Inpatient Hospital Stay: Payer: BC Managed Care – PPO | Admitting: Hematology and Oncology

## 2021-07-07 ENCOUNTER — Other Ambulatory Visit: Payer: Self-pay

## 2021-07-07 VITALS — BP 137/89 | HR 86 | Temp 98.0°F | Resp 16 | Wt 211.8 lb

## 2021-07-07 DIAGNOSIS — D472 Monoclonal gammopathy: Secondary | ICD-10-CM

## 2021-07-07 NOTE — Progress Notes (Signed)
Zach Addisson [facility] [place] [address] [place] Phone: [phone] Subjective:   IVilma Meckel, am serving as a scribe for Dr. Hulan Saas.  I'm seeing this patient by the request  of:  [name], MD  CC: Neck and back pain follow-up  IRJ:[id number]  Gwendolyn Ramos is a 56 y.o. female coming in with complaint of back and neck pain. OMT 06/10/2021. Patient states same per usual. No new complaints.  Patient continues to have significant discomfort and pain.  Still has headaches usually at night more than anything else.  Medications patient has been prescribed: None  Taking:         Reviewed prior external information including notes and imaging from previsou exam, outside providers and external EMR if available.   As well as notes that were available from care everywhere and other healthcare systems.  Past medical history, social, surgical and family history all reviewed in electronic medical record.  No pertanent information unless stated regarding to the chief complaint.   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 10/21/20    Allergies  Allergen Reactions   Levaquin [Levofloxacin]     High fever, elevated heart rate   Methylchloroisothiazolinone [Methylisothiazolinone]     In liquid  soaps causes skin irritation.     Review of Systems:  No headache, visual changes, nausea, vomiting, diarrhea, constipation, dizziness, abdominal pain, skin rash, fevers, chills, night sweats, weight loss, swollen lymph nodes, body aches, joint swelling, chest pain, shortness of breath, mood changes. POSITIVE muscle aches  Objective  Blood pressure 124/86, pulse 70, height '5\' 9"'$  (1.753 m), SpO2 96 %.   General: No apparent distress alert and oriented x3 mood and affect normal, dressed appropriately.  HEENT: Pupils equal, extraocular movements intact  Respiratory: Patient's speak in full sentences and does not appear short of breath  Cardiovascular: No lower extremity edema, non tender, no erythema  MSK: Neck exam does have some loss of lordosis.  Some tightness noted in the paraspinal musculature.  Patient does have tightness in the upper back as well.  Limited sidebending in the neck.  Negative Spurling's. Back   Osteopathic findings  C2 flexed rotated and side bent right C7 flexed rotated and side bent right  T3 extended rotated and side bent right inhaled rib T8 extended rotated and side bent left L2 flexed rotated and side bent right       Assessment and Plan:  Cervicogenic headache Chronic problem but stable. Discussed posture, ergonomics.  Hold on meds for now.  Patient will be traveling over the course of the next 3 weeks.  Discussed continuing to try to do the exercises when possible.  Follow-up with me again  in 2 months    Nonallopathic problems  Decision today to treat with OMT was based on Physical Exam  After verbal consent patient was treated with HVLA, ME, FPR techniques in cervical, rib, thoracic, lumbar, and areas  Patient tolerated the procedure well with improvement in symptoms  Patient given exercises, stretches and lifestyle modifications  See medications in patient instructions if given  Patient will follow up in 4-8 weeks              Note: This dictation was prepared with Dragon dictation along with smaller phrase technology. Any transcriptional errors that result from this process are unintentional.

## 2021-07-07 NOTE — Progress Notes (Signed)
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 Lorenso Courier. Ferritin 75.   # IgG Kappa MGUS 06/30/2021: establish care with Dr. Lorenso Courier  06/30/2021: Kappa 55.7, Lambda 8.6, Ratio 6.48. M protein 1.7  Interval History:  Gwendolyn Ramos 56 y.o. female with medical history significant for MGUS and hereditary hemochromatosis who presents for a follow up visit. The patient's last visit was on 01/05/2021 with Dr. Jana Hakim. In the interim since the last visit she has had no major changes in her health.  On exam today Gwendolyn Ramos notes she went through menopause approximately 3 to 4 years ago.  She has never had undergo phlebotomy due to her hereditary hemochromatosis.  She notes that she was tested due to the fact that her father had it.  She notes that she does drink alcohol and drinks about 2 glasses of wine per week.  She notes that she is planning an upcoming surgery for breast reduction and "tummy tuck".  She notes that she has been having issues with trigger migraines due to her enlarged breast.  She reports her energy levels have been good and her appetite is strong.  She does not have any new symptoms on a day-to-day basis.  She denies any fevers, chills, sweats, nausea, vomiting or diarrhea.  A full 10 point ROS is listed below.  MEDICAL HISTORY:  Past Medical History:  Diagnosis Date   Anxiety    COVID-19 08/2020   mild symptoms, no treatment, all symptoms resolved as of 10/21/20 per patient   Depression    Eczema of hand    both hands   GERD  (gastroesophageal reflux disease)    Hemochromatosis associated with compound heterozygous mutation in HFE gene ([place])    followed by dr Jana Hakim (oncology/ hemotology)  hereditary ,  pt was tested due to her father had the mutation   History of toxic shock syndrome    age 20   HSV-1 infection    IBS (irritable bowel syndrome)    Lichen sclerosus    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 10/21/20    SURGICAL HISTORY: Past Surgical History:  Procedure Laterality Date   BLADDER SUSPENSION N/A 10/26/2020   Procedure: TRANSVAGINAL TAPE (TVT) PROCEDURE;  Surgeon: Jaquita Folds, MD;  Location: [facility];  Service: Gynecology;  Laterality: N/A;   BUNIONECTOMY Bilateral    CHOLECYSTECTOMY  2001   CYSTOSCOPY N/A 10/26/2020   Procedure: CYSTOSCOPY;  Surgeon: Jaquita Folds, MD;  Location: [facility];  Service: Gynecology;  Laterality: N/A;   NASAL SEPTUM SURGERY     RECTOCELE REPAIR N/A 10/26/2020   Procedure: POSTERIOR REPAIR (RECTOCELE) with perineorrhaphy;  Surgeon: Jaquita Folds, MD;  Location[facility];  Service: Gynecology;  Laterality: N/A;  total time requested for all procedures is 1.5 hours   TONSILLECTOMY      SOCIAL HISTORY: Social History   Socioeconomic History   Marital status: Married    Spouse name: Not on file   Number of children: Not  on file   Years of education: Not on file   Highest education level: Not on file  Occupational History   Not on file  Tobacco Use   Smoking status: Never   Smokeless tobacco: Never  Vaping Use   Vaping Use: Never used  Substance and Sexual Activity   Alcohol use: Yes    Alcohol/week: 3.0 standard drinks of alcohol    Types: 3 Glasses of wine per week   Drug use: Never   Sexual activity: Not Currently    Birth control/protection: None  Other Topics Concern   Not on  file  Social History Narrative   Right handed    Two story home   Drinks caffeine   Social Determinants of Health   Financial Resource Strain: Not on file  Food Insecurity: Not on file  Transportation Needs: Not on file  Physical Activity: Not on file  Stress: Not on file  Social Connections: Not on file  Intimate Partner Violence: Not on file    FAMILY HISTORY: Family History  Problem Relation Age of Onset   Diabetes Mother    COPD Mother    Congestive Heart Failure Mother    Thyroid disease Mother    Hemochromatosis Father    Alcoholism Father    Diabetes Sister    Asthma Sister    Asthma Brother    Diabetes Maternal Aunt    Breast cancer Maternal Aunt    Diabetes Other     ALLERGIES:  is allergic to levaquin [levofloxacin] and methylchloroisothiazolinone [methylisothiazolinone].  MEDICATIONS:  Current Outpatient Medications  Medication Sig Dispense Refill   betamethasone dipropionate 0.05 % cream Apply topically 2 (two) times daily.     buPROPion (WELLBUTRIN SR) 150 MG 12 hr tablet Take 150 mg by mouth 2 (two) times daily.     Multiple Vitamins-Minerals (MULTIVITAMIN WITH MINERALS) tablet Take 1 tablet by mouth daily.     omeprazole (PRILOSEC) 20 MG capsule Take 20 mg by mouth daily.     topiramate (TOPAMAX) 100 MG tablet Take 1 tablet (100 mg total) by mouth at bedtime. 30 tablet 5   UBRELVY 100 MG TABS TAKE 1 TABLET BY MOUTH AS NEEDED (MAY REPEAT IN 2 HOURS IF NEEDED. MAXIMUM 2 TABLETS IN 24 HOURS.). 10 tablet 3   valACYclovir (VALTREX) 500 MG tablet Take 1 tablet (500 mg total) by mouth daily. 90 tablet 4   vitamin E 180 MG (400 UNITS) capsule Take 1 capsule (400 Units total) by mouth daily.     No current facility-administered medications for this visit.    REVIEW OF SYSTEMS:   Constitutional: ( - ) fevers, ( - )  chills , ( - ) night sweats Eyes: ( - ) blurriness of vision, ( - ) double vision, ( - ) watery eyes Ears, nose, mouth, throat, and face: ( - )  mucositis, ( - ) sore throat Respiratory: ( - ) cough, ( - ) dyspnea, ( - ) wheezes Cardiovascular: ( - ) palpitation, ( - ) chest discomfort, ( - ) lower extremity swelling Gastrointestinal:  ( - ) nausea, ( - ) heartburn, ( - ) change in bowel habits Skin: ( - ) abnormal skin rashes Lymphatics: ( - ) new lymphadenopathy, ( - ) easy bruising Neurological: ( - ) numbness, ( - ) tingling, ( - ) new weaknesses Behavioral/Psych: ( - ) mood change, ( - ) new changes  All other systems were reviewed with the patient and are negative.  PHYSICAL EXAMINATION:  Vitals:   07/07/21 1424  BP: 137/89  Pulse: 86  Resp: 16  Temp: 98 F (36.7 C)  SpO2: 94%   Filed Weights   07/07/21 1424  Weight: 211 lb 12.8 oz (96.1 kg)    GENERAL: Well-appearing middle-age Caucasian female, alert, no distress and comfortable SKIN: skin color, texture, turgor are normal, no rashes or significant lesions EYES: conjunctiva are pink and non-injected, sclera clear LUNGS: clear to auscultation and percussion with normal breathing effort HEART: regular rate & rhythm and no murmurs and no lower extremity edema Musculoskeletal: no cyanosis of digits and no clubbing  PSYCH: alert & oriented x 3, fluent speech NEURO: no focal motor/sensory deficits  LABORATORY DATA:  I have reviewed the data as listed    Latest Ref Rng & Units 06/30/2021    9:51 AM 04/17/2021   10:43 PM 12/30/2020    8:14 AM  CBC  WBC 4.0 - 10.5 K/uL 7.4  12.5  8.2   Hemoglobin 12.0 - 15.0 g/dL 14.2  14.5  14.1   Hematocrit 36.0 - 46.0 % 41.9  43.9  40.5   Platelets 150 - 400 K/uL 312  323  316        Latest Ref Rng & Units 06/30/2021    9:51 AM 04/17/2021   10:43 PM 12/30/2020    8:14 AM  CMP  Glucose 70 - 99 mg/dL 110  96  107   BUN 6 - 20 mg/dL '9  12  14   ' Creatinine 0.44 - 1.00 mg/dL 1.08  1.05  1.15   Sodium 135 - 145 mmol/L 136  137  139   Potassium 3.5 - 5.1 mmol/L 4.1  3.8  4.2   Chloride 98 - 111 mmol/L 106  105  109   CO2 22 -  32 mmol/L '22  21  20   ' Calcium 8.9 - 10.3 mg/dL 10.1  9.8  9.7   Total Protein 6.5 - 8.1 g/dL 8.5   8.5   Total Bilirubin 0.3 - 1.2 mg/dL 0.6   0.6   Alkaline Phos 38 - 126 U/L 62   63   AST 15 - 41 U/L 49   25   ALT 0 - 44 U/L 35   36     Lab Results  Component Value Date   MPROTEIN 1.7 (H) 06/30/2021   MPROTEIN 1.3 (H) 12/30/2020   MPROTEIN 1.8 (H) 09/03/2020   Lab Results  Component Value Date   KPAFRELGTCHN 55.7 (H) 06/30/2021   KPAFRELGTCHN 51.4 (H) 12/30/2020   KPAFRELGTCHN 43.9 (H) 09/03/2020   LAMBDASER 8.6 06/30/2021   LAMBDASER 7.6 12/30/2020   LAMBDASER 8.3 09/03/2020   KAPLAMBRATIO 6.48 (H) 06/30/2021   KAPLAMBRATIO 6.76 (H) 12/30/2020   KAPLAMBRATIO 5.29 (H) 09/03/2020    RADIOGRAPHIC STUDIES: No results found.  ASSESSMENT & PLAN Gwendolyn Ramos 56 y.o. female with medical history significant for MGUS and hereditary hemochromatosis who presents for a follow up visit.  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  Patient is never required phlebotomy --Compound heterozygous rarely develop overload of iron. --Continue to monitor  # IgG Kappa MGUS --today will order an SPEP, UPEP, SFLC and beta 2 microglobulin --additionally will collect new baseline CBC, CMP, and LDH --recommend a metastatic bone survey yearly to assess for lytic lesions. Next due Dec 2023.  -- Patient meets the criteria for bone marrow biopsy and has never had one performed.  Would recommend pursuing  bone marrow biopsy --Return to clinic in 6 months time or sooner if there are any concerning abnormalities   Orders Placed This Encounter  Procedures   CT BIOPSY    Standing Status:   Future    Standing Expiration Date:   07/09/2022    Order Specific Question:   Lab orders requested (DO NOT place separate lab orders, these will be automatically ordered during procedure specimen collection):    Answer:   Surgical  Pathology    Order Specific Question:   Reason for Exam (SYMPTOM  OR DIAGNOSIS REQUIRED)    Answer:   monoclonal gammopathy, concern for multiple myeloma. request bone marrow biopsy    Order Specific Question:   Is patient pregnant?    Answer:   No    Order Specific Question:   Preferred location?    Answer:   [facility]   CT BONE MARROW BIOPSY & ASPIRATION    Standing Status:   Future    Standing Expiration Date:   07/09/2022    Order Specific Question:   Reason for Exam (SYMPTOM  OR DIAGNOSIS REQUIRED)    Answer:   monoclonal gammopathy, concern for multiple myeloma    Order Specific Question:   Is patient pregnant?    Answer:   No    Order Specific Question:   Preferred location?    Answer:   [facility]    All questions were answered. The patient knows to call the clinic with any problems, questions or concerns.  A total of more than 40 minutes were spent on this encounter with face-to-face time and non-face-to-face time, including preparing to see the patient, ordering tests and/or medications, counseling the patient and coordination of care as outlined above.   Ledell Peoples, MD Department of Hematology/Oncology [place] [facility] Phone: [phone] Pager: [phone] Email: [email or web address]  07/13/2021 5:42 PM

## 2021-07-08 ENCOUNTER — Ambulatory Visit: Payer: BC Managed Care – PPO | Admitting: Family Medicine

## 2021-07-08 VITALS — BP 124/86 | HR 70 | Ht 69.0 in

## 2021-07-08 DIAGNOSIS — M9902 Segmental and somatic dysfunction of thoracic region: Secondary | ICD-10-CM | POA: Diagnosis not present

## 2021-07-08 DIAGNOSIS — M9901 Segmental and somatic dysfunction of cervical region: Secondary | ICD-10-CM

## 2021-07-08 DIAGNOSIS — G4486 Cervicogenic headache: Secondary | ICD-10-CM | POA: Diagnosis not present

## 2021-07-08 DIAGNOSIS — M9903 Segmental and somatic dysfunction of lumbar region: Secondary | ICD-10-CM | POA: Diagnosis not present

## 2021-07-08 DIAGNOSIS — M9908 Segmental and somatic dysfunction of rib cage: Secondary | ICD-10-CM | POA: Diagnosis not present

## 2021-07-08 NOTE — Patient Instructions (Signed)
Good to see you! Have a great extended trip Do the exercises regularly See you again in 8-10 weeks

## 2021-07-08 NOTE — Assessment & Plan Note (Signed)
Chronic problem but stable. Discussed posture, ergonomics.  Hold on meds for now.  Patient will be traveling over the course of the next 3 weeks.  Discussed continuing to try to do the exercises when possible.  Follow-up with me again in 2 months

## 2021-07-19 ENCOUNTER — Telehealth (HOSPITAL_COMMUNITY): Payer: Self-pay | Admitting: Pharmacy Technician

## 2021-08-04 ENCOUNTER — Telehealth: Payer: Self-pay | Admitting: Plastic Surgery

## 2021-08-04 ENCOUNTER — Other Ambulatory Visit: Payer: Self-pay | Admitting: Radiology

## 2021-08-04 DIAGNOSIS — D472 Monoclonal gammopathy: Secondary | ICD-10-CM

## 2021-08-04 NOTE — Telephone Encounter (Signed)
The patient would like to move into an Aug slot if possible.  If not than she would like to keep Nov.  We will plan to meet ahead of time.

## 2021-08-05 ENCOUNTER — Ambulatory Visit (HOSPITAL_COMMUNITY)
Admission: RE | Admit: 2021-08-05 | Discharge: 2021-08-05 | Disposition: A | Discharge status: Home / Self Care | Payer: BC Managed Care – PPO | Source: Ambulatory Visit | Attending: Hematology and Oncology | Admitting: Hematology and Oncology

## 2021-08-05 ENCOUNTER — Encounter (HOSPITAL_COMMUNITY): Payer: Self-pay

## 2021-08-05 ENCOUNTER — Telehealth: Payer: Self-pay | Admitting: Plastic Surgery

## 2021-08-05 DIAGNOSIS — F32A Depression, unspecified: Secondary | ICD-10-CM | POA: Insufficient documentation

## 2021-08-05 DIAGNOSIS — K589 Irritable bowel syndrome without diarrhea: Secondary | ICD-10-CM | POA: Diagnosis not present

## 2021-08-05 DIAGNOSIS — F419 Anxiety disorder, unspecified: Secondary | ICD-10-CM | POA: Insufficient documentation

## 2021-08-05 DIAGNOSIS — K219 Gastro-esophageal reflux disease without esophagitis: Secondary | ICD-10-CM | POA: Diagnosis not present

## 2021-08-05 DIAGNOSIS — D472 Monoclonal gammopathy: Secondary | ICD-10-CM | POA: Diagnosis present

## 2021-08-05 LAB — CBC WITH DIFFERENTIAL/PLATELET
Abs Immature Granulocytes: 0.03 10*3/uL (ref 0.00–0.07)
Basophils Absolute: 0.1 10*3/uL (ref 0.0–0.1)
Basophils Relative: 1 %
Eosinophils Absolute: 0.3 10*3/uL (ref 0.0–0.5)
Eosinophils Relative: 4 %
HCT: 40.5 % (ref 36.0–46.0)
Hemoglobin: 14 g/dL (ref 12.0–15.0)
Immature Granulocytes: 0 %
Lymphocytes Relative: 32 %
Lymphs Abs: 2.9 10*3/uL (ref 0.7–4.0)
MCH: 32.1 pg (ref 26.0–34.0)
MCHC: 34.6 g/dL (ref 30.0–36.0)
MCV: 92.9 fL (ref 80.0–100.0)
Monocytes Absolute: 0.9 10*3/uL (ref 0.1–1.0)
Monocytes Relative: 10 %
Neutro Abs: 4.8 10*3/uL (ref 1.7–7.7)
Neutrophils Relative %: 53 %
Platelets: 286 10*3/uL (ref 150–400)
RBC: 4.36 MIL/uL (ref 3.87–5.11)
RDW: 14.3 % (ref 11.5–15.5)
WBC: 9 10*3/uL (ref 4.0–10.5)
nRBC: 0 % (ref 0.0–0.2)

## 2021-08-05 MED ORDER — DIPHENHYDRAMINE HCL 50 MG/ML IJ SOLN
INTRAMUSCULAR | Status: AC
  Filled 2021-08-05: qty 1

## 2021-08-05 MED ORDER — LIDOCAINE HCL (PF) 1 % IJ SOLN
INTRAMUSCULAR | Status: AC | PRN
  Administered 2021-08-05: 20 mL

## 2021-08-05 MED ORDER — MIDAZOLAM HCL 2 MG/2ML IJ SOLN
INTRAMUSCULAR | Status: AC
  Filled 2021-08-05: qty 4

## 2021-08-05 MED ORDER — FENTANYL CITRATE (PF) 100 MCG/2ML IJ SOLN
INTRAMUSCULAR | Status: AC
  Filled 2021-08-05: qty 2

## 2021-08-05 MED ORDER — FENTANYL CITRATE (PF) 100 MCG/2ML IJ SOLN
INTRAMUSCULAR | Status: AC | PRN
  Administered 2021-08-05: 50 ug via INTRAVENOUS

## 2021-08-05 MED ORDER — SODIUM CHLORIDE 0.9 % IV SOLN: INTRAVENOUS | Status: DC

## 2021-08-05 MED ORDER — DIPHENHYDRAMINE HCL 50 MG/ML IJ SOLN
INTRAMUSCULAR | Status: AC | PRN
  Administered 2021-08-05: 25 mg via INTRAVENOUS

## 2021-08-05 MED ORDER — MIDAZOLAM HCL 2 MG/2ML IJ SOLN
INTRAMUSCULAR | Status: AC | PRN
  Administered 2021-08-05: 1 mg via INTRAVENOUS

## 2021-08-05 NOTE — Consult Note (Signed)
Chief Complaint: Patient was seen in consultation today for  CT guided bone marrow biopsy  Referring Physician(s): Dorsey,John T IV  Supervising Physician: Ruthann Cancer  Patient Status: [facility] - Out-pt  History of Present Illness: Gwendolyn Ramos is a 56 y.o. female with past medical history significant for anxiety, depression, GERD, IBS, migraine headaches, hereditary hemochromatosis as well as IgG kappa MGUS.  She presents today for CT-guided bone marrow biopsy for further evaluation.  Past Medical History:  Diagnosis Date   Anxiety    COVID-19 08/2020   mild symptoms, no treatment, all symptoms resolved as of 10/21/20 per patient   Depression    Eczema of hand    both hands   GERD (gastroesophageal reflux disease)    Hemochromatosis associated with compound heterozygous mutation in HFE gene ([place])    followed by dr Jana Hakim (oncology/ hemotology)  hereditary ,  pt was tested due to her father had the mutation   History of toxic shock syndrome    age 16   HSV-1 infection    IBS (irritable bowel syndrome)    Lichen sclerosus    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 10/21/20    Past Surgical History:  Procedure Laterality Date   BLADDER SUSPENSION N/A 10/26/2020   Procedure: TRANSVAGINAL TAPE (TVT) PROCEDURE;  Surgeon: Jaquita Folds, MD;  Location: [facility];  Service: Gynecology;  Laterality: N/A;   BUNIONECTOMY Bilateral    CHOLECYSTECTOMY  2001   CYSTOSCOPY N/A 10/26/2020   Procedure: CYSTOSCOPY;  Surgeon: Jaquita Folds, MD;  Location: [facility];  Service: Gynecology;  Laterality: N/A;   NASAL SEPTUM SURGERY     RECTOCELE REPAIR N/A 10/26/2020   Procedure: POSTERIOR REPAIR (RECTOCELE) with perineorrhaphy;  Surgeon: Jaquita Folds, MD;  Location: [facility];  Service: Gynecology;  Laterality: N/A;   total time requested for all procedures is 1.5 hours   TONSILLECTOMY      Allergies: Levaquin [levofloxacin] and Methylchloroisothiazolinone [methylisothiazolinone]  Medications: Prior to Admission medications   Medication Sig Start Date End Date Taking? Authorizing Provider  betamethasone dipropionate 0.05 % cream Apply topically 2 (two) times daily.   Yes [provider]  buPROPion (WELLBUTRIN SR) 150 MG 12 hr tablet Take 150 mg by mouth 2 (two) times daily.   Yes [provider]  Multiple Vitamins-Minerals (MULTIVITAMIN WITH MINERALS) tablet Take 1 tablet by mouth daily. 01/05/21  Yes Magrinat, Virgie Dad, MD  omeprazole (PRILOSEC) 20 MG capsule Take 20 mg by mouth daily.   Yes [provider]  TART CHERRY PO Take 1 tablet by mouth daily.   Yes [provider]  topiramate (TOPAMAX) 100 MG tablet Take 1 tablet (100 mg total) by mouth at bedtime. 05/10/21  Yes Jaffe, Adam R, DO  UBRELVY 100 MG TABS TAKE 1 TABLET BY MOUTH AS NEEDED (MAY REPEAT IN 2 HOURS IF NEEDED. MAXIMUM 2 TABLETS IN 24 HOURS.). 01/05/21  Yes Tomi Likens, Adam R, DO  valACYclovir (VALTREX) 500 MG tablet Take 1 tablet (500 mg total) by mouth daily. 03/12/21  Yes Megan Salon, MD  vitamin E 180 MG (400 UNITS) capsule Take 1 capsule (400 Units total) by mouth daily. 01/05/21   Magrinat, Virgie Dad, MD     Family History  Problem Relation Age of Onset   Diabetes Mother    COPD Mother  Congestive Heart Failure Mother    Thyroid disease Mother    Hemochromatosis Father    Alcoholism Father    Diabetes Sister    Asthma Sister    Asthma Brother    Diabetes Maternal Aunt    Breast cancer Maternal Aunt    Diabetes Other     Social History   Socioeconomic History   Marital status: Married    Spouse name: Not on file   Number of children: Not on file   Years of education: Not on file   Highest education level: Not on file  Occupational History   Not on file  Tobacco Use   Smoking  status: Never   Smokeless tobacco: Never  Vaping Use   Vaping Use: Never used  Substance and Sexual Activity   Alcohol use: Yes    Alcohol/week: 3.0 standard drinks of alcohol    Types: 3 Glasses of wine per week   Drug use: Never   Sexual activity: Not Currently    Birth control/protection: None  Other Topics Concern   Not on file  Social History Narrative   Right handed    Two story home   Drinks caffeine   Social Determinants of Health   Financial Resource Strain: Not on file  Food Insecurity: Not on file  Transportation Needs: Not on file  Physical Activity: Not on file  Stress: Not on file  Social Connections: Not on file      Review of Systems denies fever, headache, chest pain, dyspnea, cough, abdominal pain, back pain, nausea, vomiting or bleeding  Vital Signs: BP 133/84   Pulse 76   Temp 98.1 F (36.7 C) (Oral)   Resp 18   Ht '5\' 9"'  (1.753 m)   Wt 210 lb (95.3 kg)   LMP  (LMP Unknown) Comment: patient had spotting in October 2020  SpO2 98%   BMI 31.01 kg/m     Physical Exam awake, alert.  Chest clear to auscultation bilaterally.  Heart with regular rate and rhythm.  Abdomen soft, positive bowel sounds, nontender.  No lower extremity edema.  Imaging: No results found.  Labs:  CBC: Recent Labs    12/30/20 0814 04/17/21 2243 06/30/21 0951 08/05/21 0743  WBC 8.2 12.5* 7.4 9.0  HGB 14.1 14.5 14.2 14.0  HCT 40.5 43.9 41.9 40.5  PLT 316 323 312 286    COAGS: No results for input(s): "INR", "APTT" in the last 8760 hours.  BMP: Recent Labs    09/03/20 0815 12/30/20 0814 04/17/21 2243 06/30/21 0951  NA 139 139 137 136  K 4.2 4.2 3.8 4.1  CL 111 109 105 106  CO2 20* 20* 21* 22  GLUCOSE 108* 107* 96 110*  BUN '12 14 12 9  ' CALCIUM 10.0 9.7 9.8 10.1  CREATININE 0.98 1.15* 1.05* 1.08*  GFRNONAA >60 56* >60 >60    LIVER FUNCTION TESTS: Recent Labs    09/03/20 0815 12/30/20 0814 06/30/21 0951  BILITOT 0.4 0.6 0.6  AST 28 25 49*   ALT 34 36 35  ALKPHOS 66 63 62  PROT 8.7* 8.5* 8.5*  ALBUMIN 4.1 4.2 4.5    TUMOR MARKERS: No results for input(s): "AFPTM", "CEA", "CA199", "CHROMGRNA" in the last 8760 hours.  Assessment and Plan: 56 y.o. female with past medical history significant for anxiety, depression, GERD, IBS, migraine headaches, hereditary hemochromatosis as well as IgG kappa MGUS.  She presents today for CT-guided bone marrow biopsy for further evaluation.Risks and benefits of procedure was discussed  with the patient  including, but not limited to bleeding, infection, damage to adjacent structures or low yield requiring additional tests.  All of the questions were answered and there is agreement to proceed.  Consent signed and in chart.    Thank you for this interesting consult.  I greatly enjoyed meeting [facility] and look forward to participating in their care.  A copy of this report was sent to the requesting provider on this date.  Electronically Signed: D. Rowe Robert, PA-C 08/05/2021, 8:10 AM   I spent a total of   20 minutes  in face to face in clinical consultation, greater than 50% of which was counseling/coordinating care for CT-guided bone marrow biopsy

## 2021-08-05 NOTE — Procedures (Signed)
Interventional Radiology Procedure Note  Procedure: CT guided aspirate and core biopsy of right iliac bone  Complications: None  Recommendations: - Bedrest supine x 1 hrs - Hydrocodone PRN  Pain - Follow biopsy results   Gwendolyn Devincenzi, MD   

## 2021-08-05 NOTE — Telephone Encounter (Signed)
Lvm for patient advising that Dr. Marla Roe has 9/11 available if patient is agreeable to reschedule surgery from Dr. Claudia Desanctis to her. Requested callback

## 2021-08-05 NOTE — Discharge Instructions (Addendum)
Bone Marrow Aspiration and Bone Marrow Biopsy, Adult, Care After This sheet gives you information about how to care for yourself after your procedure. Your health care provider may also give you more specific instructions. If you have problems or questions, contact your health careprovider. Please call Interventional Radiology clinic [phone] with any questions or concerns. You may remove your dressing and shower tomorrow.  What can I expect after the procedure? After the procedure, it is common to have: Mild pain and tenderness. Swelling. Bruising. Follow these instructions at home: Puncture site care Follow instructions from your health care provider about how to take care of the puncture site. Make sure you: Wash your hands with soap and water before and after you change your bandage (dressing). If soap and water are not available, use hand sanitizer. Change your dressing as told by your health care provider. Check your puncture site every day for signs of infection. Check for: More redness, swelling, or pain. Fluid or blood. Warmth. Pus or a bad smell.  Activity Return to your normal activities as told by your health care provider. Ask your health care provider what activities are safe for you. Do not lift anything that is heavier than 10 lb (4.5 kg), or the limit that you are told, until your health care provider says that it is safe. Do not drive for 24 hours if you were given a sedative during your procedure. General instructions Take over-the-counter and prescription medicines only as told by your health care provider. Do not take baths, swim, or use a hot tub until your health care provider approves. Ask your health care provider if you may take showers. You may only be allowed to take sponge baths. If directed, put ice on the affected area. To do this: Put ice in a plastic bag. Place a towel between your skin and the bag. Leave the ice on for 20 minutes, 2-3 times a  day. Keep all follow-up visits as told by your health care provider. This is important.  Contact a health care provider if: Your pain is not controlled with medicine. You have a fever. You have more redness, swelling, or pain around the puncture site. You have fluid or blood coming from the puncture site. Your puncture site feels warm to the touch. You have pus or a bad smell coming from the puncture site. Summary After the procedure, it is common to have mild pain, tenderness, swelling, and bruising. Follow instructions from your health care provider about how to take care of the puncture site and what activities are safe for you. Take over-the-counter and prescription medicines only as told by your health care provider. Contact a health care provider if you have any signs of infection, such as fluid or blood coming from the puncture site. This information is not intended to replace advice given to you by your health care provider. Make sure you discuss any questions you have with your healthcare provider. Document Revised: 05/29/2018 Document Reviewed: 05/29/2018 Elsevier Patient Education  [place]. Moderate Conscious Sedation, Adult, Care After This sheet gives you information about how to care for yourself after your procedure. Your health care provider may also give you more specific instructions. If you have problems or questions, contact your health careprovider. What can I expect after the procedure? After the procedure, it is common to have: Sleepiness for several hours. Impaired judgment for several hours. Difficulty with balance. Vomiting if you eat too soon. Follow these instructions at home: For the time  period you were told by your health care provider: Rest. Do not participate in activities where you could fall or become injured. Do not drive or use machinery. Do not drink alcohol. Do not take sleeping pills or medicines that cause drowsiness. Do not make  important decisions or sign legal documents. Do not take care of children on your own. Eating and drinking  Follow the diet recommended by your health care provider. Drink enough fluid to keep your urine pale yellow. If you vomit: Drink water, juice, or soup when you can drink without vomiting. Make sure you have little or no nausea before eating solid foods.  General instructions Take over-the-counter and prescription medicines only as told by your health care provider. Have a responsible adult stay with you for the time you are told. It is important to have someone help care for you until you are awake and alert. Do not smoke. Keep all follow-up visits as told by your health care provider. This is important. Contact a health care provider if: You are still sleepy or having trouble with balance after 24 hours. You feel light-headed. You keep feeling nauseous or you keep vomiting. You develop a rash. You have a fever. You have redness or swelling around the IV site. Get help right away if: You have trouble breathing. You have new-onset confusion at home. Summary After the procedure, it is common to feel sleepy, have impaired judgment, or feel nauseous if you eat too soon. Rest after you get home. Know the things you should not do after the procedure. Follow the diet recommended by your health care provider and drink enough fluid to keep your urine pale yellow. Get help right away if you have trouble breathing or new-onset confusion at home. This information is not intended to replace advice given to you by your health care provider. Make sure you discuss any questions you have with your healthcare provider. Document Revised: 05/10/2019 Document Reviewed: 12/06/2018 Elsevier Patient Education  2022 [employer].

## 2021-08-09 LAB — SURGICAL PATHOLOGY

## 2021-08-10 ENCOUNTER — Telehealth: Payer: Self-pay

## 2021-08-10 NOTE — Telephone Encounter (Signed)
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 We will see her back as schedule in Dec 2023.  Pt acknowledged information and verbalized understanding.  

## 2021-08-14 ENCOUNTER — Encounter: Payer: Self-pay | Admitting: Plastic Surgery

## 2021-08-14 ENCOUNTER — Telehealth: Payer: Self-pay | Admitting: Plastic Surgery

## 2021-08-14 ENCOUNTER — Ambulatory Visit (INDEPENDENT_AMBULATORY_CARE_PROVIDER_SITE_OTHER): Payer: Self-pay | Admitting: Plastic Surgery

## 2021-08-14 DIAGNOSIS — Z719 Counseling, unspecified: Secondary | ICD-10-CM | POA: Insufficient documentation

## 2021-08-14 NOTE — Progress Notes (Signed)
Patient ID: Gwendolyn Ramos, female    DOB: 1965/07/29, 56 y.o.   MRN: [id number]   Chief Complaint  Patient presents with   Advice Only    The patient is a 56 year old female joining me for further discussion about her surgical options.  She was originally seen in June she is interested in breast reduction.  She complains of large breasts, DD/DDD cup.  She wanted to move ahead with surgery without trying to get it through the insurance.  She is not a smoker and does not she is also interested in improving the contour of her abdominal area.  Previous surgeries include cholecystectomy and bladder surgery. She also has MGUS.    Review of Systems  Constitutional: Negative.   HENT: Negative.    Eyes: Negative.   Respiratory: Negative.    Cardiovascular: Negative.   Gastrointestinal: Negative.   Endocrine: Negative.   Genitourinary: Negative.   Musculoskeletal:  Positive for back pain and neck pain.  Neurological: Negative.     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 10/21/20    Past Surgical History:  Procedure Laterality Date   BLADDER SUSPENSION N/A 10/26/2020   Procedure: TRANSVAGINAL TAPE (TVT) PROCEDURE;  Surgeon: Gwendolyn Folds, MD;  Location: [facility];  Service: Gynecology;  Laterality: N/A;    BUNIONECTOMY Bilateral    CHOLECYSTECTOMY  2001   CYSTOSCOPY N/A 10/26/2020   Procedure: CYSTOSCOPY;  Surgeon: Gwendolyn Folds, MD;  Location: [facility];  Service: Gynecology;  Laterality: N/A;   NASAL SEPTUM SURGERY     RECTOCELE REPAIR N/A 10/26/2020   Procedure: POSTERIOR REPAIR (RECTOCELE) with perineorrhaphy;  Surgeon: Gwendolyn Folds, MD;  Location[facility];  Service: Gynecology;  Laterality: N/A;  total time requested for all procedures is 1.5 hours   TONSILLECTOMY        Current Outpatient Medications:    betamethasone dipropionate 0.05 % cream, Apply topically 2 (two) times daily., Disp: , Rfl:    buPROPion (WELLBUTRIN SR) 150 MG 12 hr tablet, Take 150 mg by mouth 2 (two) times daily., Disp: , Rfl:    Cholecalciferol (VITAMIN D3) 1.25 MG (50000 UT) CAPS, , Disp: , Rfl:    Multiple Vitamins-Minerals (MULTIVITAMIN WITH MINERALS) tablet, Take 1 tablet by mouth daily., Disp: , Rfl:    Omega-3 Fatty Acids (FISH OIL) 1000 MG CAPS, Take by mouth., Disp: , Rfl:    omeprazole (PRILOSEC) 20 MG capsule, Take 20 mg by mouth daily., Disp: , Rfl:    TART CHERRY PO, Take 1 tablet by mouth daily., Disp: , Rfl:    topiramate (TOPAMAX) 100 MG tablet, Take 1 tablet (100 mg total) by mouth at bedtime.,  Disp: 30 tablet, Rfl: 5   UBRELVY 100 MG TABS, TAKE 1 TABLET BY MOUTH AS NEEDED (MAY REPEAT IN 2 HOURS IF NEEDED. MAXIMUM 2 TABLETS IN 24 HOURS.)., Disp: 10 tablet, Rfl: 3   valACYclovir (VALTREX) 500 MG tablet, Take 1 tablet (500 mg total) by mouth daily., Disp: 90 tablet, Rfl: 4   Objective:   There were no vitals filed for this visit.  Physical Exam Constitutional:      Appearance: Normal appearance.  HENT:     Head: Normocephalic and atraumatic.  Cardiovascular:     Rate and Rhythm: Normal rate.     Pulses: Normal pulses.  Pulmonary:     Effort: Pulmonary effort is normal.  Abdominal:     General: There is no distension.     Palpations:  Abdomen is soft.     Tenderness: There is no abdominal tenderness.  Musculoskeletal:        General: No swelling.  Skin:    General: Skin is warm.     Capillary Refill: Capillary refill takes less than 2 seconds.     Coloration: Skin is not jaundiced.  Neurological:     Mental Status: She is alert and oriented to person, place, and time.  Psychiatric:        Mood and Affect: Mood normal.        Behavior: Behavior normal.        Thought Content: Thought content normal.        Judgment: Judgment normal.     Assessment & Plan:  Encounter for counseling  We discussed the surgery plan for breast reduction.  She would like to be around a B/C cup.  And she does want to do the abdominoplasty at the same time we talked about the risks and complications.  We also talked about the challenges with healing after that much surgery.  We have a date for November.  We will see her as it gets closer to the surgery.  In the meantime she will increase her protein and decrease her carbohydrates and sugars.  She also has a little helped her for the postop period.  Young Harris, DO

## 2021-08-14 NOTE — Telephone Encounter (Signed)
Pt stopped by the front desk to check out and had a question for Dr. Marla Roe about the surgeries.  Pt wanted to know if she was to separate the two surgeries what would be the time span it would be before she is able to have the other surgery done.  Meaning do the breast reduction and then go back and have the tummy tuck done or vise versa.  Pt would like to have a call to let her know.

## 2021-08-16 ENCOUNTER — Encounter (HOSPITAL_COMMUNITY): Payer: Self-pay | Admitting: Hematology and Oncology

## 2021-08-17 ENCOUNTER — Telehealth: Payer: Self-pay

## 2021-08-17 NOTE — Telephone Encounter (Addendum)
Called [employer] , spoke with Loel Dubonnet changed provider to Dr. Marla Roe for surgery cpt [phone] Scheduled 11/29/2021 11/29/2021 at [facility] Ref# Nadi 11:03am 08/17/2021. Will be faxing a new update for KKO#[id number]  Good through 7/3-11/6/23 per Anderson Malta

## 2021-08-20 ENCOUNTER — Telehealth: Payer: Self-pay | Admitting: *Deleted

## 2021-08-20 NOTE — Telephone Encounter (Signed)
Follow up call made to pt to address her question on length of time she needs to wait between surgeries. Advised at least 6 weeks per Dr. Marla Roe. Pt voiced understanding

## 2021-09-01 NOTE — Progress Notes (Unsigned)
Gwendolyn Ramos [facility] [address] [place] Phone: [phone] Subjective:   IVilma Ramos, am serving as a scribe for Dr. Hulan Saas.  I'm seeing this patient by the request  of:  Gwendolyn Pillar, MD  CC: Headache, neck pain and back pain follow-up  [id number]  Gwendolyn Ramos is a 56 y.o. female coming in with complaint of back and neck pain. OMT 07/08/2021. Patient states same per usual. No new issues.  Patient has seen plastic surgery.  Discussed about breast reduction surgery.  Medications patient has been prescribed: None  Taking:         Reviewed prior external information including notes and imaging from previsou exam, outside providers and external EMR if available.   As well as notes that were available from care everywhere and other healthcare systems.  Past medical history, social, surgical and family history all reviewed in electronic medical record.  No pertanent information unless stated regarding to the chief complaint.   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 10/21/20    Allergies  Allergen Reactions   Levaquin [Levofloxacin]     High fever, elevated heart rate   Methylchloroisothiazolinone [Methylisothiazolinone]     In liquid soaps causes skin irritation.      Review of Systems:  No headache, visual changes, nausea, vomiting, diarrhea, constipation, dizziness, abdominal pain, skin rash, fevers, chills, night sweats, weight loss, swollen lymph nodes, joint swelling, chest pain, shortness of breath, mood changes. POSITIVE muscle aches, body aches  Objective  Blood pressure 122/82, pulse 71, height '5\' 9"'$  (1.753 m), weight 217 lb (98.4 kg), SpO2 96 %.   General: No apparent distress alert and oriented x3 mood and affect normal, dressed appropriately.  HEENT: Pupils equal, extraocular movements intact  Respiratory: Patient's speak in full sentences and does not appear short of breath  Cardiovascular: No lower extremity edema, non tender, no erythema  Gait MSK:  Back does have some loss of lordosis.  Some tenderness to palpation of the paraspinal musculature.  Patient does have somewhat limited sidebending.  Osteopathic findings  C2 flexed rotated and side bent right C5 flexed rotated and side bent left C7 flexed rotated and side bent right T3 extended rotated and side bent right inhaled rib T7 extended rotated and side bent left L2 flexed rotated and side bent right Sacrum right on right       Assessment and Plan:  Cervicogenic headache Chronic problem with exacerbation.  Patient is also working on possibly getting a breast reduction which I do think will be helpful.  Discussed continuing to increase activity otherwise.  Patient will continue to do home exercises otherwise.  Continue to work on Engineer, building services.  Follow-up again in 6 to 8 weeks    Nonallopathic problems  Decision today to treat with OMT was based on Physical Exam  After verbal consent patient was treated with HVLA, ME, FPR techniques in cervical, rib, thoracic, lumbar, and sacral  areas  Patient tolerated the procedure well with improvement in symptoms  Patient given exercises, stretches and lifestyle modifications  See medications in patient  instructions if given  Patient will follow up in 4-8 weeks     The above documentation has been reviewed and is accurate and complete Lyndal Pulley, DO         Note: This dictation was prepared with Dragon dictation along with smaller phrase technology. Any transcriptional errors that result from this process are unintentional.

## 2021-09-02 ENCOUNTER — Ambulatory Visit (INDEPENDENT_AMBULATORY_CARE_PROVIDER_SITE_OTHER): Payer: BC Managed Care – PPO | Admitting: Family Medicine

## 2021-09-02 ENCOUNTER — Other Ambulatory Visit: Payer: Self-pay | Admitting: Family Medicine

## 2021-09-02 VITALS — BP 122/82 | HR 71 | Ht 69.0 in | Wt 217.0 lb

## 2021-09-02 DIAGNOSIS — M9908 Segmental and somatic dysfunction of rib cage: Secondary | ICD-10-CM | POA: Diagnosis not present

## 2021-09-02 DIAGNOSIS — M9901 Segmental and somatic dysfunction of cervical region: Secondary | ICD-10-CM

## 2021-09-02 DIAGNOSIS — G4486 Cervicogenic headache: Secondary | ICD-10-CM

## 2021-09-02 DIAGNOSIS — M9904 Segmental and somatic dysfunction of sacral region: Secondary | ICD-10-CM

## 2021-09-02 DIAGNOSIS — M9903 Segmental and somatic dysfunction of lumbar region: Secondary | ICD-10-CM

## 2021-09-02 DIAGNOSIS — M9902 Segmental and somatic dysfunction of thoracic region: Secondary | ICD-10-CM

## 2021-09-02 DIAGNOSIS — Z9189 Other specified personal risk factors, not elsewhere classified: Secondary | ICD-10-CM

## 2021-09-02 NOTE — Assessment & Plan Note (Signed)
Chronic problem with exacerbation.  Patient is also working on possibly getting a breast reduction which I do think will be helpful.  Discussed continuing to increase activity otherwise.  Patient will continue to do home exercises otherwise.  Continue to work on Engineer, building services.  Follow-up again in 6 to 8 weeks

## 2021-09-02 NOTE — Patient Instructions (Signed)
Good to see you! Thanks for showing me your spa

## 2021-10-20 NOTE — Progress Notes (Unsigned)
Gwendolyn Ramos [address] [place] Phone: [phone] Subjective:   Gwendolyn Ramos, am serving as a scribe for Dr. Hulan Saas.  I'm seeing this patient by the request  of:  [name], MD  CC: Neck and back pain follow-up  IOM:[id number]  Gwendolyn Ramos is a 56 y.o. female coming in with complaint of back and neck pain. OMT 09/02/2021. Patient states doing well. Same per usual. No new issues.  Patient is going to be undergoing surgery in November for her breast reduction surgery.  Medications patient has been prescribed: None  Taking:         Reviewed prior external information including notes and imaging from previsou exam, outside providers and external EMR if available.   As well as notes that were available from care everywhere and other healthcare systems.  Past medical history, social, surgical and family history all reviewed in electronic medical record.  No pertanent information unless stated regarding to the chief complaint.   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 10/21/20    Allergies  Allergen Reactions   Levaquin [Levofloxacin]     High fever, elevated heart rate   Methylchloroisothiazolinone [Methylisothiazolinone]     In liquid soaps causes skin  irritation.     Review of Systems:  No  visual changes, nausea, vomiting, diarrhea, constipation, dizziness, abdominal pain, skin rash, fevers, chills, night sweats, weight loss, swollen lymph nodes, body aches, joint swelling, chest pain, shortness of breath, mood changes. POSITIVE muscle aches, back pain, headache  Objective  Blood pressure 118/72, pulse 76, height '5\' 9"'$  (1.753 m), weight 204 lb (92.5 kg), SpO2 96 %.   General: No apparent distress alert and oriented x3 mood and affect normal, dressed appropriately.  HEENT: Pupils equal, extraocular movements intact  Respiratory: Patient's speak in full sentences and does not appear short of breath  Cardiovascular: No lower extremity edema, non tender, no erythema  More upper discomfort noted of the back pain.  Does have some tightness of the cervical area of the neck especially with sidebending bilaterally.  Osteopathic findings  C2 flexed rotated and side bent right C5 flexed rotated and side bent left C7 flexed rotated and side bent right T3 extended rotated and side bent right inhaled rib T5 extended rotated and side bent left and mild right        Assessment and Plan:  Cervicogenic headache Patient has had more of a cervicogenic headache still.  Patient is making some progress. Patient does have a problem excessive breast tissue and is going to be undergoing a breast reduction that I think will be beneficial for patient's headache and neck pain.  Follow-up with me again  in 6 to 8 weeks after surgery.    Nonallopathic problems  Decision today to treat with OMT was based on Physical Exam  After verbal consent patient was treated with HVLA, ME, FPR techniques in cervical, rib, thoracic,  areas  Patient tolerated the procedure well with improvement in symptoms  Patient given exercises, stretches and lifestyle modifications  See medications in patient instructions if given  Patient will follow up in 4-8 weeks      The above documentation has been reviewed and is accurate and complete Gwendolyn Pulley, DO         Note: This dictation was prepared with Dragon dictation along with smaller phrase technology. Any transcriptional errors that result from this process are unintentional.

## 2021-10-21 ENCOUNTER — Ambulatory Visit (INDEPENDENT_AMBULATORY_CARE_PROVIDER_SITE_OTHER): Payer: BC Managed Care – PPO | Admitting: Family Medicine

## 2021-10-21 VITALS — BP 118/72 | HR 76 | Ht 69.0 in | Wt 204.0 lb

## 2021-10-21 DIAGNOSIS — M9901 Segmental and somatic dysfunction of cervical region: Secondary | ICD-10-CM

## 2021-10-21 DIAGNOSIS — M9908 Segmental and somatic dysfunction of rib cage: Secondary | ICD-10-CM | POA: Diagnosis not present

## 2021-10-21 DIAGNOSIS — M9902 Segmental and somatic dysfunction of thoracic region: Secondary | ICD-10-CM | POA: Diagnosis not present

## 2021-10-21 DIAGNOSIS — G4486 Cervicogenic headache: Secondary | ICD-10-CM

## 2021-10-21 NOTE — Patient Instructions (Signed)
Good to see you! Return Right before surgery Wish you the best with your sister

## 2021-10-21 NOTE — Assessment & Plan Note (Signed)
Patient has had more of a cervicogenic headache still.  Patient is making some progress. Patient does have a problem excessive breast tissue and is going to be undergoing a breast reduction that I think will be beneficial for patient's headache and neck pain.  Follow-up with me again in 6 to 8 weeks after surgery.

## 2021-11-07 ENCOUNTER — Other Ambulatory Visit: Payer: Self-pay | Admitting: Neurology

## 2021-11-08 NOTE — Progress Notes (Unsigned)
NEUROLOGY FOLLOW UP OFFICE NOTE  Gwendolyn Ramos [id number]  Assessment/Plan:   Migraine without aura, without status migrainosus, not intractable New onset paroxysmal headaches with left facial numbness - unclear etiology Cervicalgia - source for her headaches as well   1.  Migraine prevention: Topiramate to '100mg'$  at bedtime 2.  Migraine rescue:  Ubrelvy'100mg'$  3.  Limit use of pain relievers to no more than 2 days out of week to prevent risk of rebound or medication-overuse headache. 4.  Keep headache diary 5.  Refer to [facility] for neck pain 6.  Follow up 6 months.   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   UPDATE: To evaluate new paroxysmal headache and left facial numbness, she had an MRI/MRA of head on 05/19/2021, which was personally reviewed, and was unremarkable.    Due to headaches, increased topiramate to '100mg'$  at bedtime.  ***  Migraines are more manageable. Intensity:  Moderate (rarely severe) Duration:  If Ubrelvy taken immediately, then quickly, otherwise may need to repeat dose and aborts in 2 hours. Frequency:  2 a month Frequency of abortive medication: no other analgesics/NSAIDs or triptans Current NSAIDS:  none Current analgesics:  none Current triptans:  none Current ergotamine:  none Current anti-emetic:  none Current muscle relaxants:  none Current anti-anxiolytic:  none Current sleep aide:  none Current Antihypertensive medications:  none Current Antidepressant medications:  Wellbutrin Current Anticonvulsant medications:  topiramate '75mg'$  at bedtime Current anti-CGRP:  Ubrelvy '100mg'$  Current Vitamins/Herbal/Supplements:  MVI Current Antihistamines/Decongesrants:  none Other therapy:  Aroma therapy.  She does receive Botox for cosmetic reasons. Hormone/birth control:  none   Caffeine:  1 to 2 cups of coffee daily Diet:  At least 64 oz water daily. Does not skip  meals Exercise:  Routine.  Walk, golf, tennis Depression:  Yes but stable; Anxiety:  no Other pain:  no Sleep hygiene:  good   HISTORY:  Onset:  High school and college Location:  1.  Diffuse; 2.  Unilateral either side; 3.  Bilateral retro-orbital.  Sometimes associated neck pain. Quality:  1.  Pressure; 2.  Throbbing; 3.  Stabbing Initial Intensity:  1.  Mild-moderate; 2.  Severe; 3.  Severe.  She denies new headache, thunderclap headache or severe headache that wakes her from sleep. Aura:  no Associated symptoms:  Photophobia, phonophobia, osmophobia, dizziness, sometimes blurred vision.  No recent nausea or vomiting.  She denies associated unilateral numbness or weakness. Initial Duration:  Hour to all day Initial Frequency:  Daily headache (severe headaches occur 1 to 2 times a month) Frequency of abortive medication: She pre-treats with Aleve to prevent progression into migraine.  Takes Aleve 4 to 5 days a week. Triggers:  Valsalva, hitting head, menopause; neck pain; perfumes or gasoline, hunger, sugar/sweets Relieving factors:  Aroma therapy; hot shower Activity:  Aggravates.  On 04/17/2021, she had a new type of headache.  She describes a 7/10 searing paroxysmal pain on left greater than right side of head.  She had associated persistent left sided facial numbness as well.  She went to the ED where brain imaging was suggested but she decided to leave.  It lasted an entire day.  She always has a dull head pressure but she had no headache at all for 3 days.       Past NSAIDS:  Advil, Aleve (upsets stomach) Past analgesics:  Tylenol with codeine; Tylenol Past abortive triptans:  Imitrex, Maxalt Past  abortive ergotamine:  none Past muscle relaxants:  Soma; Flexeril Past anti-emetic:  none Past antihypertensive medications:  none Past antidepressant medications:  none Past anticonvulsant medications:  none Past anti-CGRP:  none Past vitamins/Herbal/Supplements:  none Past  antihistamines/decongestants:  none Other past therapies:  none     Family history of headache:  Mother (migraines); daughter (migraines)  PAST MEDICAL HISTORY: Past Medical History:  Diagnosis Date   Anxiety    COVID-19 08/2020   mild symptoms, no treatment, all symptoms resolved as of 10/21/20 per patient   Depression    Eczema of hand    both hands   GERD (gastroesophageal reflux disease)    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 10/21/20    MEDICATIONS: Current Outpatient Medications on File Prior to Visit  Medication Sig Dispense Refill   betamethasone dipropionate 0.05 % cream Apply topically 2 (two) times daily.     buPROPion (WELLBUTRIN SR) 150 MG 12 hr tablet Take 150 mg by mouth 2 (two) times daily.     Cholecalciferol (VITAMIN D3) 1.25 MG (50000 UT) CAPS      Multiple Vitamins-Minerals (MULTIVITAMIN WITH MINERALS) tablet Take 1 tablet by mouth daily.     Omega-3 Fatty Acids (FISH OIL) 1000 MG CAPS Take by mouth.     omeprazole (PRILOSEC) 20 MG capsule Take 20 mg by mouth daily.     rizatriptan (MAXALT) 10 MG tablet Take 10 mg by mouth as needed for migraine. May repeat in 2 hours if needed     TART CHERRY PO Take 1 tablet by mouth daily.     topiramate (TOPAMAX) 100 MG tablet Take 1 tablet (100 mg total) by mouth at bedtime. 30 tablet 5   UBRELVY 100 MG TABS TAKE 1 TABLET BY MOUTH AS NEEDED (MAY REPEAT IN 2 HOURS IF NEEDED. MAXIMUM 2 TABLETS IN 24 HOURS.). 10 tablet 3   valACYclovir (VALTREX) 500 MG tablet Take 1 tablet (500 mg total) by mouth daily. 90 tablet 4    No current facility-administered medications on file prior to visit.    ALLERGIES: Allergies  Allergen Reactions   Levaquin [Levofloxacin]     High fever, elevated heart rate   Methylchloroisothiazolinone [Methylisothiazolinone]     In liquid soaps causes skin irritation.    FAMILY HISTORY: Family History  Problem Relation Age of Onset   Diabetes Mother    COPD Mother    Congestive Heart Failure Mother    Thyroid disease Mother    Hemochromatosis Father    Alcoholism Father    Diabetes Sister    Asthma Sister    Asthma Brother    Diabetes Maternal Aunt    Breast cancer Maternal Aunt    Diabetes Other       Objective:  *** General: No acute distress.  Patient appears well-groomed.   Head:  Normocephalic/atraumatic Eyes:  Fundi examined but not visualized Neurological Exam: ***   Metta Clines, DO  CC: [name], MD

## 2021-11-10 ENCOUNTER — Telehealth: Payer: BC Managed Care – PPO | Admitting: Neurology

## 2021-11-10 ENCOUNTER — Encounter: Payer: BC Managed Care – PPO | Admitting: Student

## 2021-11-10 ENCOUNTER — Encounter: Payer: Self-pay | Admitting: Neurology

## 2021-11-10 VITALS — BP 143/81 | HR 75 | Ht 69.0 in | Wt 200.8 lb

## 2021-11-10 DIAGNOSIS — G43009 Migraine without aura, not intractable, without status migrainosus: Secondary | ICD-10-CM | POA: Diagnosis not present

## 2021-11-10 MED ORDER — UBRELVY 100 MG PO TABS: 1.0000 | ORAL_TABLET | ORAL | 11 refills | Status: DC | PRN

## 2021-11-10 MED ORDER — TOPIRAMATE 100 MG PO TABS: 100.0000 mg | ORAL_TABLET | Freq: Every day | ORAL | 11 refills | Status: DC

## 2021-11-11 ENCOUNTER — Telehealth: Payer: Self-pay

## 2021-11-11 NOTE — Telephone Encounter (Addendum)
Faxed copy of authorization [id number] 7/3-12/4 and booking to [facility] with [facility]

## 2021-11-15 ENCOUNTER — Encounter: Payer: Self-pay | Admitting: *Deleted

## 2021-11-17 ENCOUNTER — Ambulatory Visit
Admission: RE | Admit: 2021-11-17 | Discharge: 2021-11-17 | Disposition: A | Discharge status: Home / Self Care | Payer: No Typology Code available for payment source | Source: Ambulatory Visit | Attending: Family Medicine | Admitting: Family Medicine

## 2021-11-17 DIAGNOSIS — Z9189 Other specified personal risk factors, not elsewhere classified: Secondary | ICD-10-CM

## 2021-11-22 NOTE — Progress Notes (Unsigned)
[place] [facility] [place] Phone: [phone] Subjective:   Gwendolyn Ramos, am serving as a scribe for Dr. Hulan Saas.  I'm seeing this patient by the request  of:  Pahwani, Rinka R, MD  CC: Back and neck pain follow-up  HAL:[id number]  Gwendolyn Ramos is a 56 y.o. female coming in with complaint of back and neck pain. OMT 10/01/2021. Patient states that her scapular pain has increased since yesterday. Ramos new pain otherwise. Pain not bad today though.   Also c/o muscular cramping. Wants to know what is causing this. Cramping occurring in quads, hamstrings, calves.  Patient is being treated for MGUS.  States that overall seems to be relatively normal at the moment but does not quite feel like herself.  Following up with her oncologist next month.  Medications patient has been prescribed: None           Reviewed prior external information including notes and imaging from previsou exam, outside providers and external EMR if available.   As well as notes that were available from care everywhere and other healthcare systems.  Past medical history, social, surgical and family history all reviewed in electronic medical record.  Ramos pertanent information unless stated regarding to the chief complaint.   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 10/21/20    Allergies  Allergen Reactions   Levaquin [Levofloxacin]     High fever, elevated heart rate   Methylchloroisothiazolinone [Methylisothiazolinone]     In liquid soaps causes skin irritation.     Review of Systems:  Ramos headache, visual changes, nausea, vomiting, diarrhea, constipation, dizziness, abdominal pain, skin rash, fevers, chills, night sweats, weight loss, swollen lymph nodes, body aches, joint swelling, chest pain, shortness of breath, mood changes. POSITIVE muscle aches  Objective  Blood pressure 116/80, pulse 73, height '5\' 9"'$  (1.753 m), weight 199 lb (90.3 kg), SpO2 97 %.   General: Ramos apparent distress alert and oriented x3 mood and affect normal, dressed appropriately.  HEENT: Pupils equal, extraocular movements intact  Respiratory: Patient's speak in full sentences and does not appear short of breath  Cardiovascular: Ramos lower extremity edema, non tender, Ramos erythema  Gait MSK:  Back does have some loss of lordosis.  Significant tightness in the scapular region again bilaterally.  Ramos midline tenderness.  Patient does have tightness of the neck noted.  Large breast tissue is noted again  Osteopathic findings  C2 flexed rotated and side bent right C6 flexed rotated and side bent left T3 extended rotated and side bent right inhaled rib T9 extended rotated and side bent left L2 flexed rotated and side bent right Sacrum right on right  Assessment and Plan:  Cervicogenic headache Patient does have a cervicogenic headaches.  He is going to be having breast reduction surgery in the fairly near future.  Patient has some difficulty with her iron levels.  We do need to monitor this.  Has the potential for hereditary hemochromatosis but also has MGUS that does seem to be somewhat concerning.  We discussed with patient about potential iron supplementation but not on a daily basis 3 times a week  potentially that could help with some of the different fatigue that she is feeling at the moment.  We discussed potential lab work but patient is having with her oncologist in the next month.  Patient will follow-up with me again in 3 months otherwise.Total time discussing with patient and reviewing patient's most recent labs and office visits 32 minutes  Abnormal breast tissue Likely contributing to the pain in the breast reduction surgery will be beneficial I think to help her with some of the neck pain.  We will see me 6 weeks after surgery which is in December.    Nonallopathic problems  Decision today to treat with OMT was based on Physical Exam  After verbal consent patient was treated with HVLA, ME, FPR techniques in cervical, rib, thoracic, lumbar, and sacral  areas  Patient tolerated the procedure well with improvement in symptoms  Patient given exercises, stretches and lifestyle modifications  See medications in patient instructions if given  Patient will follow up in 4-8 weeks    The above documentation has been reviewed and is accurate and complete Lyndal Pulley, DO          Note: This dictation was prepared with Dragon dictation along with smaller phrase technology. Any transcriptional errors that result from this process are unintentional.

## 2021-11-23 ENCOUNTER — Encounter: Payer: Self-pay | Admitting: Family Medicine

## 2021-11-23 ENCOUNTER — Ambulatory Visit (INDEPENDENT_AMBULATORY_CARE_PROVIDER_SITE_OTHER): Payer: BC Managed Care – PPO | Admitting: Family Medicine

## 2021-11-23 VITALS — BP 116/80 | HR 73 | Ht 69.0 in | Wt 199.0 lb

## 2021-11-23 DIAGNOSIS — M9901 Segmental and somatic dysfunction of cervical region: Secondary | ICD-10-CM

## 2021-11-23 DIAGNOSIS — N6459 Other signs and symptoms in breast: Secondary | ICD-10-CM | POA: Diagnosis not present

## 2021-11-23 DIAGNOSIS — M9908 Segmental and somatic dysfunction of rib cage: Secondary | ICD-10-CM | POA: Diagnosis not present

## 2021-11-23 DIAGNOSIS — M9902 Segmental and somatic dysfunction of thoracic region: Secondary | ICD-10-CM | POA: Diagnosis not present

## 2021-11-23 DIAGNOSIS — G4486 Cervicogenic headache: Secondary | ICD-10-CM | POA: Diagnosis not present

## 2021-11-23 DIAGNOSIS — M9903 Segmental and somatic dysfunction of lumbar region: Secondary | ICD-10-CM

## 2021-11-23 DIAGNOSIS — M9904 Segmental and somatic dysfunction of sacral region: Secondary | ICD-10-CM | POA: Diagnosis not present

## 2021-11-23 NOTE — Patient Instructions (Signed)
Good to see you  Give my best to your sister '65mg'$  Iron '500mg'$  of  Vit C  See me again in 3 months

## 2021-11-23 NOTE — Assessment & Plan Note (Signed)
Patient does have a cervicogenic headaches.  He is going to be having breast reduction surgery in the fairly near future.  Patient has some difficulty with her iron levels.  We do need to monitor this.  Has the potential for hereditary hemochromatosis but also has MGUS that does seem to be somewhat concerning.  We discussed with patient about potential iron supplementation but not on a daily basis 3 times a week potentially that could help with some of the different fatigue that she is feeling at the moment.  We discussed potential lab work but patient is having with her oncologist in the next month.  Patient will follow-up with me again in 3 months otherwise.Total time discussing with patient and reviewing patient's most recent labs and office visits 32 minutes

## 2021-11-23 NOTE — Assessment & Plan Note (Signed)
Likely contributing to the pain in the breast reduction surgery will be beneficial I think to help her with some of the neck pain.  We will see me 6 weeks after surgery which is in December.

## 2021-11-29 ENCOUNTER — Telehealth: Payer: Self-pay | Admitting: *Deleted

## 2021-11-29 NOTE — Telephone Encounter (Signed)
Spoke with patient about moving sx to 12/21 and rescheduled all associated appointments.

## 2021-11-30 ENCOUNTER — Telehealth: Payer: Self-pay | Admitting: Hematology and Oncology

## 2021-11-30 NOTE — Telephone Encounter (Signed)
Called patient per 11/6 in basket. Rescheduled appointments with patient.

## 2021-12-07 ENCOUNTER — Other Ambulatory Visit: Payer: Self-pay | Admitting: Hematology and Oncology

## 2021-12-07 ENCOUNTER — Other Ambulatory Visit: Payer: Self-pay

## 2021-12-07 ENCOUNTER — Inpatient Hospital Stay: Payer: BC Managed Care – PPO | Attending: Hematology and Oncology

## 2021-12-07 DIAGNOSIS — D472 Monoclonal gammopathy: Secondary | ICD-10-CM | POA: Diagnosis present

## 2021-12-07 DIAGNOSIS — Z881 Allergy status to other antibiotic agents status: Secondary | ICD-10-CM | POA: Diagnosis not present

## 2021-12-07 DIAGNOSIS — Z803 Family history of malignant neoplasm of breast: Secondary | ICD-10-CM | POA: Diagnosis not present

## 2021-12-07 DIAGNOSIS — Z8349 Family history of other endocrine, nutritional and metabolic diseases: Secondary | ICD-10-CM | POA: Insufficient documentation

## 2021-12-07 DIAGNOSIS — Z79899 Other long term (current) drug therapy: Secondary | ICD-10-CM | POA: Insufficient documentation

## 2021-12-07 DIAGNOSIS — R252 Cramp and spasm: Secondary | ICD-10-CM | POA: Insufficient documentation

## 2021-12-07 DIAGNOSIS — Z8616 Personal history of COVID-19: Secondary | ICD-10-CM | POA: Diagnosis not present

## 2021-12-07 DIAGNOSIS — Z833 Family history of diabetes mellitus: Secondary | ICD-10-CM | POA: Diagnosis not present

## 2021-12-07 DIAGNOSIS — Z832 Family history of diseases of the blood and blood-forming organs and certain disorders involving the immune mechanism: Secondary | ICD-10-CM | POA: Diagnosis not present

## 2021-12-07 DIAGNOSIS — Z8249 Family history of ischemic heart disease and other diseases of the circulatory system: Secondary | ICD-10-CM | POA: Diagnosis not present

## 2021-12-07 DIAGNOSIS — Z825 Family history of asthma and other chronic lower respiratory diseases: Secondary | ICD-10-CM | POA: Diagnosis not present

## 2021-12-07 DIAGNOSIS — Z9049 Acquired absence of other specified parts of digestive tract: Secondary | ICD-10-CM | POA: Diagnosis not present

## 2021-12-07 DIAGNOSIS — Z811 Family history of alcohol abuse and dependence: Secondary | ICD-10-CM | POA: Insufficient documentation

## 2021-12-07 LAB — CMP (CANCER CENTER ONLY)
ALT: 21 U/L (ref 0–44)
AST: 19 U/L (ref 15–41)
Albumin: 4.5 g/dL (ref 3.5–5.0)
Alkaline Phosphatase: 58 U/L (ref 38–126)
Anion gap: 6 (ref 5–15)
BUN: 21 mg/dL — ABNORMAL HIGH (ref 6–20)
CO2: 26 mmol/L (ref 22–32)
Calcium: 10 mg/dL (ref 8.9–10.3)
Chloride: 105 mmol/L (ref 98–111)
Creatinine: 1.07 mg/dL — ABNORMAL HIGH (ref 0.44–1.00)
GFR, Estimated: >60 mL/min (ref >60)
Glucose, Bld: 82 mg/dL (ref 70–99)
Potassium: 4.4 mmol/L (ref 3.5–5.1)
Sodium: 137 mmol/L (ref 135–145)
Total Bilirubin: 0.4 mg/dL (ref 0.3–1.2)
Total Protein: 8.5 g/dL — ABNORMAL HIGH (ref 6.5–8.1)

## 2021-12-07 LAB — RETIC PANEL
Immature Retic Fract: 9.6 % (ref 2.3–15.9)
RBC.: 4.4 MIL/uL (ref 3.87–5.11)
Retic Count, Absolute: 55.9 10*3/uL (ref 19.0–186.0)
Retic Ct Pct: 1.3 % (ref 0.4–3.1)
Reticulocyte Hemoglobin: 35.6 pg (ref >27.9)

## 2021-12-07 LAB — CBC WITH DIFFERENTIAL (CANCER CENTER ONLY)
Abs Immature Granulocytes: 0.02 10*3/uL (ref 0.00–0.07)
Basophils Absolute: 0.1 10*3/uL (ref 0.0–0.1)
Basophils Relative: 1 %
Eosinophils Absolute: 0.2 10*3/uL (ref 0.0–0.5)
Eosinophils Relative: 2 %
HCT: 41 % (ref 36.0–46.0)
Hemoglobin: 14.1 g/dL (ref 12.0–15.0)
Immature Granulocytes: 0 %
Lymphocytes Relative: 34 %
Lymphs Abs: 3.2 10*3/uL (ref 0.7–4.0)
MCH: 32 pg (ref 26.0–34.0)
MCHC: 34.4 g/dL (ref 30.0–36.0)
MCV: 93 fL (ref 80.0–100.0)
Monocytes Absolute: 0.7 10*3/uL (ref 0.1–1.0)
Monocytes Relative: 8 %
Neutro Abs: 5.2 10*3/uL (ref 1.7–7.7)
Neutrophils Relative %: 55 %
Platelet Count: 331 10*3/uL (ref 150–400)
RBC: 4.41 MIL/uL (ref 3.87–5.11)
RDW: 13.4 % (ref 11.5–15.5)
WBC Count: 9.3 10*3/uL (ref 4.0–10.5)
nRBC: 0 % (ref 0.0–0.2)

## 2021-12-07 LAB — IRON AND IRON BINDING CAPACITY (CC-WL,HP ONLY)
Iron: 171 ug/dL — ABNORMAL HIGH (ref 28–170)
Saturation Ratios: 66 % — ABNORMAL HIGH (ref 10.4–31.8)
TIBC: 260 ug/dL (ref 250–450)
UIBC: 89 ug/dL — ABNORMAL LOW (ref 148–442)

## 2021-12-07 LAB — FERRITIN: Ferritin: 52 ng/mL (ref 11–307)

## 2021-12-08 ENCOUNTER — Encounter: Payer: BC Managed Care – PPO | Admitting: Plastic Surgery

## 2021-12-08 LAB — KAPPA/LAMBDA LIGHT CHAINS
Kappa free light chain: 43.4 mg/L — ABNORMAL HIGH (ref 3.3–19.4)
Kappa, lambda light chain ratio: 5.36 — ABNORMAL HIGH (ref 0.26–1.65)
Lambda free light chains: 8.1 mg/L (ref 5.7–26.3)

## 2021-12-09 LAB — MULTIPLE MYELOMA PANEL, SERUM
Albumin SerPl Elph-Mcnc: 4.2 g/dL (ref 2.9–4.4)
Albumin/Glob SerPl: 1.3 (ref 0.7–1.7)
Alpha 1: 0.2 g/dL (ref 0.0–0.4)
Alpha2 Glob SerPl Elph-Mcnc: 0.6 g/dL (ref 0.4–1.0)
B-Globulin SerPl Elph-Mcnc: 0.8 g/dL (ref 0.7–1.3)
Gamma Glob SerPl Elph-Mcnc: 1.9 g/dL — ABNORMAL HIGH (ref 0.4–1.8)
Globulin, Total: 3.4 g/dL (ref 2.2–3.9)
IgA: 54 mg/dL — ABNORMAL LOW (ref 87–352)
IgG (Immunoglobin G), Serum: 2402 mg/dL — ABNORMAL HIGH (ref 586–1602)
IgM (Immunoglobulin M), Srm: 76 mg/dL (ref 26–217)
M Protein SerPl Elph-Mcnc: 1.3 g/dL — ABNORMAL HIGH
Total Protein ELP: 7.6 g/dL (ref 6.0–8.5)

## 2021-12-14 ENCOUNTER — Encounter: Payer: BC Managed Care – PPO | Admitting: Plastic Surgery

## 2021-12-14 NOTE — Progress Notes (Unsigned)
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 Lorenso Courier. Ferritin 75.   # IgG Kappa Smoldering Multiple Myeloma 06/30/2021: establish care with Dr. Lorenso Courier  06/30/2021: Kappa 55.7, Lambda 8.6, Ratio 6.48. 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   Interval History:  Gwendolyn Ramos 56 y.o. female with medical history significant for MGUS and hereditary hemochromatosis who presents for a follow up visit. The patient's last visit was on 06/30/2021. In the interim since the last visit she has had no major changes in her health.  On exam today Gwendolyn Ramos notes ***  She denies any fevers, chills, sweats, nausea, vomiting or diarrhea.  A full 10 point ROS is listed below.  MEDICAL HISTORY:  Past Medical History:  Diagnosis Date   Anxiety    COVID-19 08/2020   mild symptoms, no treatment, all symptoms resolved as of 10/21/20 per patient   Depression    Eczema of hand    both hands   GERD (gastroesophageal reflux disease)    Hemochromatosis associated with compound heterozygous mutation in HFE gene ([place])    followed by dr Jana Hakim (oncology/ hemotology)  hereditary ,  pt was tested due to her father had the mutation   History of toxic shock syndrome    age 40   HSV-1 infection    IBS (irritable bowel syndrome)    Lichen sclerosus    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 10/21/20    SURGICAL HISTORY: Past  Surgical History:  Procedure Laterality Date   BLADDER SUSPENSION N/A 10/26/2020   Procedure: TRANSVAGINAL TAPE (TVT) PROCEDURE;  Surgeon: Jaquita Folds, MD;  [facility];  Service: Gynecology;  Laterality: N/A;   BUNIONECTOMY Bilateral    CHOLECYSTECTOMY  2001   CYSTOSCOPY N/A 10/26/2020   Procedure: CYSTOSCOPY;  Surgeon: Jaquita Folds, MD;  Location: [facility];  Service: Gynecology;  Laterality: N/A;   NASAL SEPTUM SURGERY     RECTOCELE REPAIR N/A 10/26/2020   Procedure: POSTERIOR REPAIR (RECTOCELE) with perineorrhaphy;  Surgeon: Jaquita Folds, MD;  Location: [facility];  Service: Gynecology;  Laterality: N/A;  total time requested for all procedures is 1.5 hours   TONSILLECTOMY      SOCIAL HISTORY: Social History   Socioeconomic History   Marital status: Married    Spouse name: Not on file   Number of children: Not on file   Years of education: Not on file   Highest education level: Not on file  Occupational History   Not on file  Tobacco Use   Smoking status: Never   Smokeless tobacco: Never  Vaping Use   Vaping Use: Never used  Substance and Sexual Activity   Alcohol use: Yes    Alcohol/week: 3.0 standard drinks of alcohol    Types: 3 Glasses of wine per week   Drug use: Never   Sexual activity: Not Currently    Birth control/protection: None  Other Topics Concern   Not on file  Social History Narrative   Right handed    Two story home   Drinks caffeine   Social Determinants of Health   Financial Resource Strain: Not on file  Food Insecurity: Not on file  Transportation Needs: Not on file  Physical Activity: Not on file  Stress: Not on file  Social Connections: Not on file  Intimate Partner Violence: Not on file    FAMILY HISTORY: Family History  Problem Relation Age of Onset   Diabetes Mother    COPD Mother    Congestive Heart Failure Mother    Thyroid disease Mother     Hemochromatosis Father    Alcoholism Father    Diabetes Sister    Asthma Sister    Asthma Brother    Diabetes Maternal Aunt    Breast cancer Maternal Aunt    Diabetes Other     ALLERGIES:  is allergic to levaquin [levofloxacin] and methylchloroisothiazolinone [methylisothiazolinone].  MEDICATIONS:  Current Outpatient Medications  Medication Sig Dispense Refill   betamethasone dipropionate 0.05 % cream Apply topically 2 (two) times daily.     buPROPion (WELLBUTRIN SR) 150 MG 12 hr tablet Take 150 mg by mouth 2 (two) times daily.     Cholecalciferol (VITAMIN D3) 1.25 MG (50000 UT) CAPS      estradiol (ESTRACE) 0.1 MG/GM vaginal cream Place 1 Applicatorful vaginally 3 (three) times a week.     Ginger, Zingiber officinalis, (GINGER ROOT PO) Take 450 mg by mouth daily at 2 PM.     Multiple Vitamins-Minerals (MULTIVITAMIN WITH MINERALS) tablet Take 1 tablet by mouth daily.     NON FORMULARY Calories 5     NON FORMULARY On guard     Omega-3 Fatty Acids (FISH OIL) 1000 MG CAPS Take by mouth.     omeprazole (PRILOSEC) 20 MG capsule Take 20 mg by mouth daily.     OVER THE COUNTER MEDICATION Vegan micro VM -Doterra     OZEMPIC, 0.25 OR 0.5 MG/DOSE, 2 MG/3ML SOPN SMARTSIG:0.25 Milligram(s) SUB-Q Once a Week     TART CHERRY PO Take 1 tablet by mouth daily.     topiramate (TOPAMAX) 100 MG tablet Take 1 tablet (100 mg total) by mouth at bedtime. 30 tablet 11   Ubrogepant (UBRELVY) 100 MG TABS Take 1 tablet by mouth as needed (May repeat in 2 hours if needed.  Maximum 2 tablets in 24 hours.). 10 tablet 11   valACYclovir (VALTREX) 500 MG tablet Take 1 tablet (500 mg total) by mouth daily. 90 tablet 4   No current facility-administered medications for this visit.    REVIEW OF SYSTEMS:   Constitutional: ( - ) fevers, ( - )  chills , ( - ) night sweats Eyes: ( - ) blurriness of vision, ( - ) double vision, ( - ) watery eyes Ears, nose, mouth, throat, and face: ( - ) mucositis, ( - ) sore  throat Respiratory: ( - ) cough, ( - ) dyspnea, ( - ) wheezes Cardiovascular: ( - ) palpitation, ( - ) chest discomfort, ( - ) lower extremity swelling Gastrointestinal:  ( - ) nausea, ( - ) heartburn, ( - ) change in bowel habits Skin: ( - ) abnormal skin rashes Lymphatics: ( - ) new lymphadenopathy, ( - ) easy bruising Neurological: ( - ) numbness, ( - ) tingling, ( - ) new weaknesses Behavioral/Psych: ( - ) mood change, ( - ) new changes  All other systems were reviewed with the patient and are negative.  PHYSICAL EXAMINATION:  There were no vitals filed for this visit.  There were no vitals filed for this visit.   GENERAL: Well-appearing middle-age Caucasian female, alert, no distress and comfortable SKIN: skin color, texture, turgor are normal, no rashes or significant lesions EYES: conjunctiva are pink and non-injected, sclera clear LUNGS: clear to auscultation and percussion with normal breathing effort HEART: regular rate & rhythm and no murmurs and no lower extremity edema Musculoskeletal: no cyanosis of digits and no clubbing  PSYCH: alert & oriented x 3, fluent speech NEURO: no focal motor/sensory deficits  LABORATORY DATA:  I have reviewed the data as listed    Latest Ref Rng & Units 12/07/2021    9:27 AM 08/05/2021    7:43 AM 06/30/2021    9:51 AM  CBC  WBC 4.0 - 10.5 K/uL 9.3  9.0  7.4   Hemoglobin 12.0 - 15.0 g/dL 14.1  14.0  14.2   Hematocrit 36.0 - 46.0 % 41.0  40.5  41.9   Platelets 150 - 400 K/uL 331  286  312        Latest Ref Rng & Units 12/07/2021    9:27 AM 06/30/2021    9:51 AM 04/17/2021   10:43 PM  CMP  Glucose 70 - 99 mg/dL 82  110  96   BUN 6 - 20 mg/dL _0 Creatinine 0.44 - 1.00 mg/dL 1.07  1.08  1.05   Sodium 135 - 145 mmol/L 137  136  137   Potassium 3.5 - 5.1 mmol/L 4.4  4.1  3.8   Chloride 98 - 111 mmol/L 105  106  105   CO2 22 - 32 mmol/L _1 Calcium 8.9 - 10.3 mg/dL 10.0  10.1  9.8   Total Protein 6.5 - 8.1 g/dL 8.5   8.5    Total Bilirubin 0.3 - 1.2 mg/dL 0.4  0.6    Alkaline Phos 38 - 126 U/L 58  62    AST 15 - 41 U/L 19  49    ALT 0 - 44 U/L 21  35      Lab Results  Component Value Date   MPROTEIN 1.3 (H) 12/07/2021   MPROTEIN 1.7 (H) 06/30/2021   MPROTEIN 1.3 (H) 12/30/2020   Lab Results  Component Value Date   KPAFRELGTCHN 43.4 (H) 12/07/2021   KPAFRELGTCHN 55.7 (H) 06/30/2021   KPAFRELGTCHN 51.4 (H) 12/30/2020   LAMBDASER 8.1 12/07/2021   LAMBDASER 8.6 06/30/2021   LAMBDASER 7.6 12/30/2020   KAPLAMBRATIO 5.36 (H) 12/07/2021   KAPLAMBRATIO 6.48 (H) 06/30/2021   KAPLAMBRATIO 6.76 (H) 12/30/2020    RADIOGRAPHIC STUDIES: CT CARDIAC SCORING ([facility] LOCATIONS ONLY)  Result Date: 11/18/2021 CLINICAL DATA:  56 year old Caucasian female with hyperlipidemia presents for coronary artery calcium scoring. * Tracking Code: [place] * EXAM: CT CARDIAC CORONARY ARTERY CALCIUM SCORE TECHNIQUE: Non-contrast imaging through the heart was performed using prospective ECG gating. Image post processing was performed on an independent workstation, allowing for quantitative analysis of the heart and coronary arteries. Note that this exam targets the heart and the chest was not imaged in its entirety. COMPARISON:  None available FINDINGS: CORONARY CALCIUM SCORES: Left Main: 0 LAD: 0 LCx: 0 RCA: 0 Total Agatston Score: 0 MESA database percentile: 0 AORTA MEASUREMENTS: Ascending Aorta: 3.3 cm Descending Aorta:2.5 cm OTHER FINDINGS: Cardiovascular: Noncontrast appearance of the heart great vessels is unremarkable to the extent evaluated. Mediastinum/Nodes: No adenopathy or acute process in  the mediastinum. Lungs/Pleura: Lungs are clear and airways are patent to the extent evaluated. Upper Abdomen: Incidental imaging of upper abdominal contents without acute findings. Small hiatal hernia. Musculoskeletal: No acute bone finding. No destructive bone process. Spinal degenerative changes. IMPRESSION: 1. Coronary artery calcium score of  0. No signs of calcified coronary artery disease. 2. Small hiatal hernia. Electronically Signed   By: Zetta Bills M.D.   On: 11/18/2021 14:05    ASSESSMENT & PLAN Manon [facility] 56 y.o. female with medical history significant for MGUS and hereditary hemochromatosis who presents for a follow up visit.  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  Patient is never required phlebotomy --Compound heterozygous rarely develop overload of iron. --Continue to monitor  # IgG Kappa Smoldering Multiple Myeloma --today will order an SPEP, UPEP, SFLC and beta 2 microglobulin --additionally will collect new baseline CBC, CMP, and LDH --recommend a metastatic bone survey yearly to assess for lytic lesions. Next due Dec 2023.  -- Patient meets the criteria for bone marrow biopsy and has never had one performed.  Would recommend pursuing bone marrow biopsy --Return to clinic in 6 months time or sooner if there are any concerning abnormalities   No orders of the defined types were placed in this encounter.   All questions were answered. The patient knows to call the clinic with any problems, questions or concerns.  A total of more than 30 minutes were spent on this encounter with face-to-face time and non-face-to-face time, including preparing to see the patient, ordering tests and/or medications, counseling the patient and coordination of care as outlined above.   Ledell Peoples, MD Department of Hematology/Oncology [facility] Phone: [phone] Pager: [phone] Email: [email or web address]  12/14/2021 5:11 PM

## 2021-12-15 ENCOUNTER — Other Ambulatory Visit: Payer: BC Managed Care – PPO

## 2021-12-15 ENCOUNTER — Other Ambulatory Visit: Payer: Self-pay

## 2021-12-15 ENCOUNTER — Inpatient Hospital Stay (HOSPITAL_BASED_OUTPATIENT_CLINIC_OR_DEPARTMENT_OTHER): Payer: BC Managed Care – PPO | Admitting: Hematology and Oncology

## 2021-12-15 VITALS — BP 126/87 | HR 86 | Temp 97.6°F | Resp 16 | Ht 69.0 in | Wt 199.8 lb

## 2021-12-15 DIAGNOSIS — D472 Monoclonal gammopathy: Secondary | ICD-10-CM | POA: Diagnosis not present

## 2021-12-21 ENCOUNTER — Encounter: Payer: BC Managed Care – PPO | Admitting: Surgical

## 2021-12-22 ENCOUNTER — Ambulatory Visit: Payer: BC Managed Care – PPO | Admitting: Hematology and Oncology

## 2021-12-29 ENCOUNTER — Ambulatory Visit (INDEPENDENT_AMBULATORY_CARE_PROVIDER_SITE_OTHER): Payer: BC Managed Care – PPO | Admitting: Family Medicine

## 2021-12-29 VITALS — BP 110/70 | HR 74 | Ht 69.0 in | Wt 200.0 lb

## 2021-12-29 DIAGNOSIS — M9908 Segmental and somatic dysfunction of rib cage: Secondary | ICD-10-CM | POA: Diagnosis not present

## 2021-12-29 DIAGNOSIS — M9903 Segmental and somatic dysfunction of lumbar region: Secondary | ICD-10-CM

## 2021-12-29 DIAGNOSIS — G4486 Cervicogenic headache: Secondary | ICD-10-CM | POA: Diagnosis not present

## 2021-12-29 DIAGNOSIS — M9901 Segmental and somatic dysfunction of cervical region: Secondary | ICD-10-CM

## 2021-12-29 DIAGNOSIS — M9902 Segmental and somatic dysfunction of thoracic region: Secondary | ICD-10-CM

## 2021-12-29 DIAGNOSIS — M9904 Segmental and somatic dysfunction of sacral region: Secondary | ICD-10-CM

## 2021-12-29 NOTE — Patient Instructions (Signed)
Good to see you   Follow up in  

## 2021-12-29 NOTE — Assessment & Plan Note (Signed)
Responded very well again.  Patient did have a rib injury but did not seem to have any type of broken bone.  We did discuss imaging which patient declined.  Attempted osteopathic manipulation with some good resolution of pain.  Patient will be going through with her surgery in the near future.  We discussed that after she has surgery we would see her again in 6 weeks afterwards.  Patient is in agreement with the plan.  Follow-up with me again at that time
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
## 2021-12-29 NOTE — Assessment & Plan Note (Signed)
We did discuss the iron supplementation.  Patient would like to not take it secondary to what the hematologist sent.  Patient did take it for short time and when she did her leg cramps to go away.  We discussed that yes long-term she should not take iron supplementation.

## 2021-12-30 ENCOUNTER — Other Ambulatory Visit: Payer: BC Managed Care – PPO

## 2022-01-04 ENCOUNTER — Encounter: Payer: BC Managed Care – PPO | Admitting: Plastic Surgery

## 2022-01-06 ENCOUNTER — Ambulatory Visit: Payer: BC Managed Care – PPO | Admitting: Hematology and Oncology

## 2022-01-11 ENCOUNTER — Ambulatory Visit (INDEPENDENT_AMBULATORY_CARE_PROVIDER_SITE_OTHER): Payer: BC Managed Care – PPO | Admitting: Surgical

## 2022-01-11 ENCOUNTER — Encounter: Payer: Self-pay | Admitting: Surgical

## 2022-01-11 VITALS — BP 121/85 | HR 83 | Ht 69.0 in | Wt 195.0 lb

## 2022-01-11 DIAGNOSIS — N62 Hypertrophy of breast: Secondary | ICD-10-CM

## 2022-01-11 DIAGNOSIS — M545 Low back pain, unspecified: Secondary | ICD-10-CM

## 2022-01-11 DIAGNOSIS — M546 Pain in thoracic spine: Secondary | ICD-10-CM

## 2022-01-11 DIAGNOSIS — Z719 Counseling, unspecified: Secondary | ICD-10-CM

## 2022-01-11 MED ORDER — CEPHALEXIN 500 MG PO CAPS: 500.0000 mg | ORAL_CAPSULE | Freq: Four times a day (QID) | ORAL | 0 refills | Status: AC

## 2022-01-11 MED ORDER — ONDANSETRON HCL 4 MG PO TABS: 4.0000 mg | ORAL_TABLET | Freq: Three times a day (TID) | ORAL | 0 refills | Status: DC | PRN

## 2022-01-11 MED ORDER — OXYCODONE HCL 5 MG PO TABS: 5.0000 mg | ORAL_TABLET | Freq: Four times a day (QID) | ORAL | 0 refills | Status: AC | PRN

## 2022-01-11 NOTE — Progress Notes (Signed)
Patient ID: Gwendolyn Ramos, female    DOB: Jun 05, 1965, 56 y.o.   MRN: [id number]  Chief Complaint  Patient presents with   Pre-op Exam      ICD-10-CM   1. Encounter for counseling  Z71.9     2. Macromastia  N62     3. Back pain of thoracolumbar region  M54.50    M54.6       History of Present Illness: Gwendolyn Ramos is a 56 y.o.  female  with a history of macromastia.  She presents for preoperative evaluation for upcoming procedure, Bilateral Breast Reduction and Abdominoplasty, scheduled for 01/13/2022 with Dr.  Marla Roe  The patient has not had problems with anesthesia. No history of DVT/PE.  Reports her mother had DVTs in the past, but reports she was very ill, had a significant amount of comorbidities.  No other family history of VTE.  No family or personal history of bleeding or clotting disorders.  Patient is not currently taking any blood thinners.  No history of CVA/MI.   Summary of Previous Visit: STN 32 B/l. Would like to be B/C cup, currently DD/DDD. Hx of cholecystectomy and bladder surgery.  Estimated excess breast tissue to be removed at time of surgery: 850 grams  Job: No FMLA required.  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  She reports that she has purchased a lot of items to help with her recovery such as a reclining chair, compression/SCD machine.  She has been feeling well lately, no recent changes to her health.  She was on vacation with her husband and got stuck in a train door which resulted in some back pain and right mammary fold breast pain.  This has improved, reports mild tenderness to the right breast still present, particularly the fold of the breast.  She reports that she stopped her Ozempic about 2 weeks ago.  She reports that she stopped  all of her supplements about 1 month ago.  She stopped fish oil around December 1 or even earlier.  She reports that she saw her hematologist/oncologist about a month ago and they evaluated her labs and everything looks great.   Past Medical History: Allergies: Allergies  Allergen Reactions   Levaquin [Levofloxacin]     High fever, elevated heart rate   Methylchloroisothiazolinone [Methylisothiazolinone]     In liquid soaps causes skin irritation.    Current Medications:  Current Outpatient Medications:    betamethasone dipropionate 0.05 % cream, Apply topically 2 (two) times daily., Disp: , Rfl:    buPROPion (WELLBUTRIN SR) 150 MG 12 hr tablet, Take 150 mg by mouth 2 (two) times daily., Disp: , Rfl:    cephALEXin (KEFLEX) 500 MG capsule, Take 1 capsule (500 mg total) by mouth 4 (four) times daily for 3 days., Disp: 12 capsule, Rfl: 0   estradiol (ESTRACE) 0.1 MG/GM vaginal cream, Place 1 Applicatorful vaginally 3 (three) times a week., Disp: , Rfl:    Multiple Vitamins-Minerals (MULTIVITAMIN WITH MINERALS) tablet, Take 1 tablet by mouth daily., Disp: , Rfl:    NON FORMULARY, Calories 5, Disp: , Rfl:    NON FORMULARY, On guard, Disp: , Rfl:    Omega-3 Fatty Acids (FISH OIL) 1000 MG CAPS, Take by mouth., Disp: , Rfl:    omeprazole (PRILOSEC) 20 MG capsule, Take 20 mg  by mouth daily., Disp: , Rfl:    ondansetron (ZOFRAN) 4 MG tablet, Take 1 tablet (4 mg total) by mouth every 8 (eight) hours as needed for nausea or vomiting., Disp: 20 tablet, Rfl: 0   OVER THE COUNTER MEDICATION, Vegan micro VM -Doterra, Disp: , Rfl:    OVER THE COUNTER MEDICATION, Iron 65 mg-Take 1 table by mouth every other day., Disp: , Rfl:    OVER THE COUNTER MEDICATION, Magnesium '325mg'$ -Take 2 capsule by mouth daily., Disp: , Rfl:    oxyCODONE (OXY IR/ROXICODONE) 5 MG immediate release tablet, Take 1 tablet (5 mg total) by mouth every 6 (six) hours as needed for up to 5 days for severe pain., Disp: 20 tablet, Rfl:  0   OZEMPIC, 0.25 OR 0.5 MG/DOSE, 2 MG/3ML SOPN, SMARTSIG:0.25 Milligram(s) SUB-Q Once a Week, Disp: , Rfl:    TART CHERRY PO, Take 1 tablet by mouth daily., Disp: , Rfl:    topiramate (TOPAMAX) 100 MG tablet, Take 1 tablet (100 mg total) by mouth at bedtime., Disp: 30 tablet, Rfl: 11   Ubrogepant (UBRELVY) 100 MG TABS, Take 1 tablet by mouth as needed (May repeat in 2 hours if needed.  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 10/21/20    Past Surgical History: Past Surgical History:  Procedure Laterality Date   BLADDER SUSPENSION N/A 10/26/2020   Procedure: TRANSVAGINAL TAPE (TVT) PROCEDURE;  Surgeon: Jaquita Folds, MD;  Location: [facility];  Service: Gynecology;  Laterality: N/A;   BUNIONECTOMY Bilateral    CHOLECYSTECTOMY  2001   CYSTOSCOPY N/A 10/26/2020   Procedure: CYSTOSCOPY;  Surgeon: Jaquita Folds, MD;  Location: [facility];  Service: Gynecology;  Laterality: N/A;   NASAL SEPTUM SURGERY     RECTOCELE REPAIR N/A 10/26/2020   Procedure: POSTERIOR REPAIR (RECTOCELE) with perineorrhaphy;  Surgeon: Jaquita Folds, MD;  Location[facility];  Service: Gynecology;  Laterality: N/A;  total time requested for all procedures is 1.5 hours   TONSILLECTOMY      Social History: Social History   Socioeconomic History   Marital status: Married    Spouse name: Not on file   Number of children: Not on file   Years of education: Not on file   Highest education level: Not on file  Occupational History   Not on file  Tobacco Use   Smoking status: Never   Smokeless tobacco: Never  Vaping Use   Vaping Use: Never used  Substance and  Sexual Activity   Alcohol use: Yes    Alcohol/week: 3.0 standard drinks of alcohol    Types: 3 Glasses of wine per week   Drug use: Never   Sexual activity: Not Currently    Birth control/protection: None  Other Topics Concern   Not on file  Social History Narrative   Right handed    Two story home   Drinks caffeine   Social Determinants of Health   Financial Resource Strain: Not on file  Food Insecurity: Not on file  Transportation Needs: Not on file  Physical Activity: Not on file  Stress: Not on file  Social Connections: Not on file  Intimate Partner Violence: Not on file    Family History: Family History  Problem Relation Age of Onset   Diabetes Mother    COPD Mother    Congestive Heart Failure Mother    Thyroid disease Mother    Hemochromatosis Father    Alcoholism Father    Diabetes Sister    Asthma Sister    Asthma Brother    Diabetes Maternal Aunt    Breast cancer Maternal Aunt    Diabetes Other     Review of Systems: Review of Systems  Constitutional: Negative.   Respiratory: Negative.    Cardiovascular: Negative.   Gastrointestinal: Negative.   Neurological: Negative.     Physical Exam: Vital Signs BP 121/85 (BP Location: Left Arm, Patient Position: Sitting, Cuff Size: Large)   Pulse 83   Ht '5\' 9"'$  (1.753 m)   Wt 195 lb (88.5 kg)   LMP  (LMP Unknown) Comment: patient had spotting in October 2020  SpO2  99%   BMI 28.80 kg/m   Physical Exam  Constitutional:      General: Not in acute distress.    Appearance: Normal appearance. Not ill-appearing.  HENT:     Head: Normocephalic and atraumatic.  Eyes:     Pupils: Pupils are equal, round Neck:     Musculoskeletal: Normal range of motion.  Cardiovascular:     Rate and Rhythm: Normal rate    Pulses: Normal pulses.  Pulmonary:     Effort: Pulmonary effort is normal. No respiratory distress.   Abdomen: Nondistended Musculoskeletal: Normal range of motion.  Skin:    General: Skin is warm and dry.     Findings: No erythema or rash.  Neurological:     General: No focal deficit present.     Mental Status: Alert and oriented to person, place, and time. Mental status is at baseline.     Motor: No weakness.  Psychiatric:        Mood and Affect: Mood normal.        Behavior: Behavior normal.    Assessment/Plan: The patient is scheduled for bilateral breast reduction and abdominoplasty with Dr. Marla Roe.  Risks, benefits, and alternatives of procedure discussed, questions answered and consent obtained.    Smoking Status: Non-smoker; Counseling Given?  N/A Last Mammogram: 04/27/2021; Results: BI-RADS 1 negative  Caprini Score: 7, high; Risk Factors include: Age, BMI > 25, family history of thrombosis (mom -reports she was very ill), no other family history of thrombosis and length of planned surgery. Recommendation for mechanical and possible pharmacological prophylaxis. Encourage early ambulation.   Pictures obtained: '@consult'$   Post-op Rx sent to pharmacy: Oxycodone, Zofran, Keflex  Patient was provided with the breast reduction and General Surgical Risk consent document and Pain Medication Agreement prior to their appointment.  They had adequate time to read through the risk  consent documents and Pain Medication Agreement. We also discussed them in person together during this preop appointment. All of their questions were answered to  their satisfaction.  Recommended calling if they have any further questions.  Risk consent form and Pain Medication Agreement to be scanned into patient's chart.  The risk that can be encountered with breast reduction were discussed and include the following but not limited to these:  Breast asymmetry, fluid accumulation, firmness of the breast, inability to breast feed, loss of nipple or areola, skin loss, decrease or no nipple sensation, fat necrosis of the breast tissue, bleeding, infection, healing delay.  There are risks of anesthesia, changes to skin sensation and injury to nerves or blood vessels.  The muscle can be temporarily or permanently injured.  You may have an allergic reaction to tape, suture, glue, blood products which can result in skin discoloration, swelling, pain, skin lesions, poor healing.  Any of these can lead to the need for revisonal surgery or stage procedures.  A reduction has potential to interfere with diagnostic procedures.  Nipple or breast piercing can increase risks of infection.  This procedure is best done when the breast is fully developed.  Changes in the breast will continue to occur over time.  Pregnancy can alter the outcomes of previous breast reduction surgery, weight gain and weigh loss can also effect the long term appearance.   The risk that can be encountered for abdominoplasty were discussed and include the following but not limited to these: asymmetry, fluid accumulation, firmness of the tissue, skin loss, decrease or no sensation, fat necrosis, bleeding, infection, healing delay.  Deep vein thrombosis, cardiac and pulmonary complications are risks to any procedure.  There are risks of anesthesia, changes to skin sensation and injury to nerves or blood vessels.  The muscle can be temporarily or permanently injured.  You may have an allergic reaction to tape, suture, glue, blood products which can result in skin discoloration, swelling, pain, skin lesions, poor  healing.  Any of these can lead to the need for revisonal surgery or stage procedures.  Weight gain and weigh loss can also effect the long term appearance. The results are not guaranteed to last a lifetime.  Future surgery may be required.     The risks that can be encountered with and after liposuction were discussed and include the following but no limited to these:  Asymmetry, fluid accumulation, firmness of the area, fat necrosis with death of fat tissue, bleeding, infection, delayed healing, anesthesia risks, skin sensation changes, injury to structures including nerves, blood vessels, and muscles which may be temporary or permanent, allergies to tape, suture materials and glues, blood products, topical preparations or injected agents, skin and contour irregularities, skin discoloration and swelling, deep vein thrombosis, cardiac and pulmonary complications, pain, which may persist, persistent pain, recurrence of the lesion, poor healing of the incision, possible need for revisional surgery or staged procedures. Thiere can also be persistent swelling, poor wound healing, rippling or loose skin, worsening of cellulite, swelling, and thermal burn or heat injury from ultrasound with the ultrasound-assisted lipoplasty technique. Any change in weight fluctuations can alter the outcome.   Electronically signed by: Carola Rhine Jakaiya Netherland, PA-C 01/11/2022 9:42 AM

## 2022-01-13 ENCOUNTER — Other Ambulatory Visit: Payer: Self-pay | Admitting: Plastic Surgery

## 2022-01-13 DIAGNOSIS — Z411 Encounter for cosmetic surgery: Secondary | ICD-10-CM

## 2022-01-13 HISTORY — PX: BREAST REDUCTION SURGERY: SHX8

## 2022-01-21 ENCOUNTER — Encounter: Payer: Self-pay | Admitting: Plastic Surgery

## 2022-01-21 ENCOUNTER — Ambulatory Visit (INDEPENDENT_AMBULATORY_CARE_PROVIDER_SITE_OTHER): Payer: Self-pay | Admitting: Plastic Surgery

## 2022-01-21 VITALS — BP 124/79 | HR 83

## 2022-01-21 DIAGNOSIS — Z719 Counseling, unspecified: Secondary | ICD-10-CM

## 2022-01-21 MED ORDER — TRAMADOL HCL 50 MG PO TABS: 50.0000 mg | ORAL_TABLET | Freq: Two times a day (BID) | ORAL | 0 refills | Status: AC | PRN

## 2022-01-21 NOTE — Progress Notes (Signed)
The patient is a 56 year old female here for follow-up after undergoing breast and abdominal surgery.  She is doing really well.  She has bruising and swelling of the breast as expected.  No sign of hematoma or seroma.  I was able to remove the right abdominal drain.  Will plan to remove the left drain in the next 1 to 2 weeks depending on the output.  She can go into a sports bra and spanks.  She is having bowel movements although a little loose she will let me know if they get worse.  She is having some pain in the posterior aspect of her right leg.  This does not appear to be any kind of a blood clot.  I Georgina Peer go ahead and give her some Ultram but if it gets worse I do want to know about it.

## 2022-01-24 ENCOUNTER — Encounter: Payer: Self-pay | Admitting: Plastic Surgery

## 2022-01-24 MED ORDER — CEPHALEXIN 500 MG PO CAPS: 500.0000 mg | ORAL_CAPSULE | Freq: Four times a day (QID) | ORAL | 0 refills | Status: AC

## 2022-01-25 ENCOUNTER — Ambulatory Visit (INDEPENDENT_AMBULATORY_CARE_PROVIDER_SITE_OTHER): Payer: Self-pay | Admitting: Plastic Surgery

## 2022-01-25 ENCOUNTER — Encounter: Payer: Self-pay | Admitting: Plastic Surgery

## 2022-01-25 DIAGNOSIS — Z719 Counseling, unspecified: Secondary | ICD-10-CM

## 2022-01-25 NOTE — Progress Notes (Signed)
The patient is a 57 year old female here for evaluation of her abdomen after abdominoplasty and breast reduction 2 weeks ago.  She was concerned she was developing some increased swelling and redness on the left lower abdomen.  She was placed on an antibiotic yesterday.  She seems to be doing better.  The drain is still in place on the left.  I like to keep that in until Friday.  Continue with the antibiotics.  I removed some of the dressings which I think may have been irritating her skin a little bit.  I replaced the Steri-Strips.  She can continue with the binder or go into a spanks.  Will see her on Friday.

## 2022-01-28 ENCOUNTER — Encounter: Payer: Self-pay | Admitting: Surgical

## 2022-01-28 ENCOUNTER — Ambulatory Visit (INDEPENDENT_AMBULATORY_CARE_PROVIDER_SITE_OTHER): Payer: Self-pay | Admitting: Surgical

## 2022-01-28 VITALS — BP 123/75 | HR 77

## 2022-01-28 DIAGNOSIS — N62 Hypertrophy of breast: Secondary | ICD-10-CM

## 2022-01-28 DIAGNOSIS — Z719 Counseling, unspecified: Secondary | ICD-10-CM

## 2022-01-28 NOTE — Progress Notes (Signed)
57 year old female here for follow-up after bilateral breast reduction and abdominoplasty with Dr. Marla Roe on 01/13/2022.  She presents today with her husband, reports she is doing well, she has continue with compressive garments of her breasts and abdomen and has not had any issues at this point.  She does report that she has been having approximately 30 to 40 cc of output from the left side over the last 24 hours.  Prior to this she had 68 cc of output on the left side.  The drainage is clear.  She was placed on an antibiotic over the weekend for redness of the left abdomen, she was prescribed Keflex for 7 days.  Chaperone present on exam On exam bilateral NAC's are viable, bilateral breast incisions appear intact and are healing well.  I do not appreciate any subcutaneous fluid collections noted with palpation.  There is no erythema or cellulitic changes.  She does have some ecchymosis noted of bilateral breasts and some postsurgical edema.  On exam of her abdomen, umbilicus is viable, abdominal incision is intact and Steri-Strips are in place.  Left JP drain in place with approximately 10 cc of output, clear fluid.  I do not appreciate any cellulitic change as of her abdomen, she does have some slight redness of the left lateral abdomen, however this appears more irritative than infected at this point.  A/P:  Continue with compressive garments, avoid heavy lifting or bending at the waist.  Avoid strenuous activities.  Continue to wear compressive garments 24/7 over breast and abdomen.  No longer necessary to wear the TopiFoam at this point.  She has an appointment to follow-up in 1 week for reevaluation.  Recommend completing antibiotics, we will plan to see patient back in 1 week, call with questions or concerns.  Left drain was removed, patient tolerated this well.  Recommend Vaseline gauze to the area daily.

## 2022-02-04 ENCOUNTER — Encounter: Payer: Self-pay | Admitting: Surgical

## 2022-02-04 ENCOUNTER — Ambulatory Visit (INDEPENDENT_AMBULATORY_CARE_PROVIDER_SITE_OTHER): Payer: Self-pay | Admitting: Surgical

## 2022-02-04 VITALS — BP 143/87 | HR 80

## 2022-02-04 DIAGNOSIS — Z719 Counseling, unspecified: Secondary | ICD-10-CM

## 2022-02-04 DIAGNOSIS — N62 Hypertrophy of breast: Secondary | ICD-10-CM

## 2022-02-04 DIAGNOSIS — M546 Pain in thoracic spine: Secondary | ICD-10-CM

## 2022-02-04 DIAGNOSIS — M545 Low back pain, unspecified: Secondary | ICD-10-CM

## 2022-02-04 DIAGNOSIS — M4004 Postural kyphosis, thoracic region: Secondary | ICD-10-CM

## 2022-02-04 NOTE — Progress Notes (Signed)
Patient is a 57 year old female here for follow-up after bilateral breast reduction abdominoplasty with Dr. Marla Roe on 01/13/2022.  She is 3 weeks postop.  She reports overall she is doing really well, has minimal pain, mostly has tenderness in the lateral breast.  She reports she has had some mild drainage from the left JP drain insertion site wound, however has not noticed any other concerning swelling.  She reports that she feels as if the Keflex was causing her to have some headaches and possibly a rash on her left arm.  Chaperone present on exam On exam bilateral NAC's are viable, bilateral breast incisions are intact and healing well.  There is no subcutaneous fluid collection noted palpation.  She does have some mild ecchymosis still present.  I do not appreciate any significant swelling, however there is some mild subcutaneous edema in the lateral breast.  On exam of her abdomen, umbilicus is viable, abdominal incisions intact and healing well, CDI.  She does not have any subcutaneous fluid collection noted palpation.  No ecchymosis noted.  A/P:  Continue with compressive garments, avoid strenuous activities or heavy lifting.  She can increase activity as tolerated.  She can do sauna in 1 week.  Recommend beginning the use of scar creams in 1 week.   There is no signs of infection or concern on exam.  Pictures were taken and placed in her chart with her permission.  We will plan to see her back in 3 week

## 2022-02-08 ENCOUNTER — Encounter: Payer: Self-pay | Admitting: Physician Assistant

## 2022-02-08 ENCOUNTER — Ambulatory Visit (INDEPENDENT_AMBULATORY_CARE_PROVIDER_SITE_OTHER): Payer: Self-pay | Admitting: Physician Assistant

## 2022-02-08 VITALS — BP 135/84 | HR 85

## 2022-02-08 DIAGNOSIS — Z411 Encounter for cosmetic surgery: Secondary | ICD-10-CM

## 2022-02-08 MED ORDER — DOXYCYCLINE HYCLATE 100 MG PO TABS: 100.0000 mg | ORAL_TABLET | Freq: Two times a day (BID) | ORAL | 0 refills | Status: DC

## 2022-02-08 NOTE — Progress Notes (Signed)
This is a 57 year old female who is status post bilateral breast reduction and abdominoplasty by Dr. Marla Roe on 01/13/2022.  She was last seen in office on 02/04/2022.  At that time she had been doing well.  She had her drain removed.  Since her last office visit she notes that overall she has been doing well but over the last 2 days has noticed some redness along the lower incision, she notes some firmness.  Given the redness she started using bacitracin yesterday, she notes that today the redness has improved.  She denies any fever or any other infectious symptoms.  She denies any drainage other than some clear drainage from her drain site.  Chaperone present.  On exam abdomen is soft with no palpable fluid collections, she has firmness and warmth to touch along the abdominal incision, no areas of fluctuance, no purulence.  No significant ecchymosis.  Bilateral breast incisions are clean dry and intact, bilateral NAC's are viable, no overlying redness.  Photo taken today  Overall Ms. Yankey is very happy with her cosmetic outcome, she does have some areas of irritation along the abdominal incision that appear to have a degree of cellulitis.  Dr. Marla Roe did tempt needle aspiration with no significant fluid.  She would recommend doxycycline starting today.  We will reach out to the patient on Thursday to see how things are going.  If she continues to have worsening redness or develops any signs of fluid collection she will likely need an abdominal ultrasound with potential drain.  If she continues to improve we will see her in the office Friday morning.  She was given strict return precautions.  She verbalized understanding and agreement to today's plan had no further questions or concerns.

## 2022-02-09 ENCOUNTER — Other Ambulatory Visit: Payer: Self-pay | Admitting: Physician Assistant

## 2022-02-09 DIAGNOSIS — L03311 Cellulitis of abdominal wall: Secondary | ICD-10-CM

## 2022-02-09 NOTE — Telephone Encounter (Signed)
[name] reached out to Gwendolyn Ramos today noting her redness had spread.  She feels generally fatigued but denies any nausea and vomiting or upper abdominal pain.  She notes a temperature of 100.  I did reach out to interventional radiology they did recommend abdominal imaging and if a fluid collection is noted that they would expedite drain placement.  I put in a stat order for an abdominal ultrasound, the patient will be calling Gwendolyn Ramos for scheduling and will be in touch with Gwendolyn Ramos as far as when the ultrasound will be completed.  I did discuss strict return precautions and to reach out to Gwendolyn Ramos immediately if she develops any new or worsening signs or symptoms, she verbalized understanding and agreement to today's plan had no further questions or concerns.

## 2022-02-09 NOTE — Progress Notes (Signed)
[place] Phone: [phone] Subjective:   [name], am serving as a scribe for Dr. Hulan Saas.  I'm seeing this patient by the request  of:  Pahwani, Rinka R, MD  CC: Back and neck pain follow-up  [id number]  Gwendolyn Ramos is a 57 y.o. female coming in with complaint of back and neck pain. OMT 12/29/2021. Patient states that her pain has improved. Breast and stomach reduction in December.   Medications patient has been prescribed: None  Taking:         Reviewed prior external information including notes and imaging from previsou exam, outside providers and external EMR if available.   As well as notes that were available from care everywhere and other healthcare systems.  Patient is still fighting off a staph infection from her stomach surgery.  Past medical history, social, surgical and family history all reviewed in electronic medical record.  No pertanent information unless stated regarding to the chief complaint.   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 10/21/20    Allergies  Allergen Reactions   Levaquin [Levofloxacin]     High fever, elevated heart rate   Methylchloroisothiazolinone [Methylisothiazolinone]     In liquid soaps  causes skin irritation.   Keflex [Cephalexin] Rash    Caused possible rash of arms, HA     Review of Systems:  No headache, visual changes, nausea, vomiting, diarrhea, constipation, dizziness, abdominal pain, skin rash, fevers, chills, night sweats, weight loss, swollen lymph nodes, body aches, joint swelling, chest pain, shortness of breath, mood changes. POSITIVE muscle aches  Objective  Blood pressure 112/78, pulse 71, height '5\' 9"'$  (1.753 m), weight 190 lb (86.2 kg), SpO2 97 %.   General: No apparent distress alert and oriented x3 mood and affect normal, dressed appropriately.  HEENT: Pupils equal, extraocular movements intact  Respiratory: Patient's speak in full sentences and does not appear short of breath  Cardiovascular: No lower extremity edema, non tender, no erythema  Low back exam does have some loss lordosis.  Some tenderness to palpation in the paraspinal musculature.  Patient does have tightness noted a little bit more in the upper back.  Difficult to assess lower back secondary to patient with recent surgery of the stomach and deferred some of the exam secondary to being uncomfortable.  Osteopathic findings  C3 flexed rotated and side bent right C6 flexed rotated and side bent left T3 extended rotated and side bent right inhaled rib T7 extended rotated and side bent left      Assessment and Plan:  Cervicogenic headache Patient since the breast reduction surgery has been doing better with the headaches.  Was  having some complications secondary to some of the medications.  Discussed with patient about icing regimen and home exercises otherwise.  Discussed which activities to do and which ones to avoid.  Increase activity slowly otherwise.  Follow-up again in 6 to 8 weeks    Nonallopathic problems  Decision today to treat with OMT was based on Physical Exam  After verbal consent patient was treated with HVLA, ME, FPR techniques in cervical, rib,  thoracic,areas  Patient tolerated the procedure well with improvement in symptoms  Patient given exercises, stretches and lifestyle modifications  See medications in patient instructions if given  Patient will follow up in 8 weeks    The above documentation has been reviewed and is accurate and complete Gwendolyn Pulley, DO          Note: This dictation was prepared with Dragon dictation along with smaller phrase technology. Any transcriptional errors that result from this process are unintentional.

## 2022-02-10 ENCOUNTER — Ambulatory Visit (HOSPITAL_COMMUNITY)
Admission: RE | Admit: 2022-02-10 | Discharge: 2022-02-10 | Disposition: A | Discharge status: Home / Self Care | Payer: BC Managed Care – PPO | Source: Ambulatory Visit | Attending: Physician Assistant | Admitting: Physician Assistant

## 2022-02-10 ENCOUNTER — Telehealth: Payer: Self-pay | Admitting: Physician Assistant

## 2022-02-10 ENCOUNTER — Other Ambulatory Visit (HOSPITAL_COMMUNITY): Payer: Self-pay | Admitting: Physician Assistant

## 2022-02-10 ENCOUNTER — Telehealth: Payer: BC Managed Care – PPO | Admitting: Student

## 2022-02-10 ENCOUNTER — Other Ambulatory Visit: Payer: Self-pay | Admitting: Physician Assistant

## 2022-02-10 DIAGNOSIS — L03311 Cellulitis of abdominal wall: Secondary | ICD-10-CM | POA: Diagnosis not present

## 2022-02-10 DIAGNOSIS — R188 Other ascites: Secondary | ICD-10-CM

## 2022-02-10 DIAGNOSIS — R609 Edema, unspecified: Secondary | ICD-10-CM | POA: Diagnosis present

## 2022-02-10 MED ORDER — LIDOCAINE HCL (PF) 1 % IJ SOLN
INTRAMUSCULAR | Status: AC
  Filled 2022-02-10: qty 30

## 2022-02-10 MED ORDER — LIDOCAINE HCL (PF) 1 % IJ SOLN: 10.0000 mL | Freq: Once | INTRAMUSCULAR | Status: DC

## 2022-02-10 NOTE — Telephone Encounter (Signed)
I spoke with Gwendolyn Ramos today.  She notes that the redness has not worsened overnight and may be slightly improving.  She has been taking her antibiotics.  She was able to obtain an abdominal ultrasound this morning that was notable for several fluid collections.  I spoke with Dr. Vena Austria of interventional radiology, she would be candidate for aspiration.  I spoke with nursing staff and interventional radiology, they will add her onto the schedule today.  I spoke with [name] about this, she feels that she would have no issue with this procedure without sedation.  We have scheduled follow-up appointment with her tomorrow in our office, I have placed orders for wound culture.  We will see her tomorrow morning or sooner if she develops any new or worsening signs or symptoms.  She verbalized understanding and agreement to today's plan.

## 2022-02-10 NOTE — Procedures (Signed)
Interventional Radiology Procedure Note  Procedure: Image guided aspiration of fluid collection adjacent to incision site, 1 on the left 1 on the right.   Complications: None  EBL: None Sample: Culture sent from both/each  Recommendations: - Routine wound care - follow up Cx    Signed,  Dulcy Fanny. Earleen Newport, DO

## 2022-02-11 ENCOUNTER — Ambulatory Visit (INDEPENDENT_AMBULATORY_CARE_PROVIDER_SITE_OTHER): Payer: BC Managed Care – PPO | Admitting: Physician Assistant

## 2022-02-11 DIAGNOSIS — L03311 Cellulitis of abdominal wall: Secondary | ICD-10-CM

## 2022-02-11 NOTE — Progress Notes (Signed)
This is a 57 year old female who is status post bilateral breast reduction and abdominoplasty by Dr. Marla Roe on 01/13/2022.  She was last seen in the office on 02/08/2022.  Over the last week she has developed some redness along the lower abdomen and incision.  She was started on doxycycline, we subsequently sent her for an ultrasound which showed fluid collections and she underwent needle aspiration by interventional radiology yesterday.  She notes that the redness has not significantly worsened and seems to be slightly improved over the last several days.  She denies any fevers.  She continues to take the doxycycline, still awaiting culture results.  On exam the upper abdomen is soft with no palpable fluid collections or area of firmness, she does have some induration along the incision with no wound dehiscence, no purulence.  No active drainage.  Minimal drainage from the drain site.  Photos taken  Fortunately Mrs. Lavalle has not had any significant worsening of her cellulitis, interventional radiology was able to aspirate several fluid collections.  We anticipate with the antibiotics and the removal of fluid that she will continue to improve.  She has no signs of systemic illness today or signs of worsening.  She will continue taking the antibiotics.  We will follow her wound cultures closely and adjust antibiotics as needed.  Would like her to follow-up on Monday in the office, she knows to reach out to [place] immediately if she develops any new or worsening signs or symptoms.  She had no further questions or concerns at today's visit.

## 2022-02-14 ENCOUNTER — Ambulatory Visit (INDEPENDENT_AMBULATORY_CARE_PROVIDER_SITE_OTHER): Payer: Self-pay | Admitting: Physician Assistant

## 2022-02-14 DIAGNOSIS — L03311 Cellulitis of abdominal wall: Secondary | ICD-10-CM

## 2022-02-14 NOTE — Progress Notes (Signed)
This is a 56 year old female who is status post bilateral breast reduction and abdominoplasty by Dr. Marla Roe on 01/13/2022.  She was last seen in the office on 02/11/2022.  Since her last office visit she has been doing well.  She notes that the redness has improved, she notes she has some ongoing drainage from the abdominal incision along the medial /right side.  She denies any upper abdominal issues.  She continues to take doxycycline.  Her wound cultures came back showing staph that was pansensitive.  On exam the abdomen is flat.  She has ongoing redness along the abdominal incision, minimal drainage noted along the right side of the abdominal incision, this is serosanguineous.  There is an area of fluctuance superior to this.  No wound dehiscence, lateral incisions are clean dry and intact.  The patient continues to improve the redness is less prominent when compared to her last office visit.  She did note some ongoing drainage I did feel fluctuance, I was able to aspirate approximately 5 cc of serosanguineous fluid.  We will continue to watch her closely, she is otherwise doing well.  She will continue taking antibiotics.  I will see her in 3 days.  I did give her strict return precautions in the event she develops any new or worsening signs or symptoms.  She verbalized understanding and agreement to today's plan had no further questions or concerns.

## 2022-02-15 ENCOUNTER — Encounter: Payer: Self-pay | Admitting: Family Medicine

## 2022-02-15 ENCOUNTER — Ambulatory Visit: Payer: BC Managed Care – PPO | Admitting: Family Medicine

## 2022-02-15 VITALS — BP 112/78 | HR 71 | Ht 69.0 in | Wt 190.0 lb

## 2022-02-15 DIAGNOSIS — M9901 Segmental and somatic dysfunction of cervical region: Secondary | ICD-10-CM | POA: Diagnosis not present

## 2022-02-15 DIAGNOSIS — G4486 Cervicogenic headache: Secondary | ICD-10-CM | POA: Diagnosis not present

## 2022-02-15 DIAGNOSIS — M9908 Segmental and somatic dysfunction of rib cage: Secondary | ICD-10-CM | POA: Diagnosis not present

## 2022-02-15 DIAGNOSIS — N6459 Other signs and symptoms in breast: Secondary | ICD-10-CM | POA: Diagnosis not present

## 2022-02-15 DIAGNOSIS — M9902 Segmental and somatic dysfunction of thoracic region: Secondary | ICD-10-CM | POA: Diagnosis not present

## 2022-02-15 LAB — AEROBIC/ANAEROBIC CULTURE W GRAM STAIN (SURGICAL/DEEP WOUND)

## 2022-02-15 NOTE — Assessment & Plan Note (Signed)
Patient since the breast reduction surgery has been doing better with the headaches.  Was having some complications secondary to some of the medications.  Discussed with patient about icing regimen and home exercises otherwise.  Discussed which activities to do and which ones to avoid.  Increase activity slowly otherwise.  Follow-up again in 6 to 8 weeks

## 2022-02-15 NOTE — Assessment & Plan Note (Signed)
I do believe that this was contributing to some discomfort and pain and cervicogenic headaches and hopefully will be better as well

## 2022-02-17 ENCOUNTER — Ambulatory Visit (INDEPENDENT_AMBULATORY_CARE_PROVIDER_SITE_OTHER): Payer: Self-pay | Admitting: Surgical

## 2022-02-17 DIAGNOSIS — Z411 Encounter for cosmetic surgery: Secondary | ICD-10-CM

## 2022-02-17 DIAGNOSIS — L03311 Cellulitis of abdominal wall: Secondary | ICD-10-CM

## 2022-02-17 MED ORDER — DOXYCYCLINE HYCLATE 100 MG PO TABS: 100.0000 mg | ORAL_TABLET | Freq: Two times a day (BID) | ORAL | 0 refills | Status: AC

## 2022-02-17 NOTE — Progress Notes (Signed)
Patient is a 57 year old female here for follow-up after bilateral breast reduction abdominoplasty with Dr. Marla Roe on 01/13/2022.  She was last seen in the office on 02/14/2022, she has been doing much better since then, reports the redness has improved.  She is not having any infectious symptoms.  She did undergo ultrasound-guided aspiration of the fluid collections of her abdomen on 02/10/2022.  Pathology resulted with Staphylococcus aureus, pansensitive.  She has tolerated doxycycline well and has been on doxycycline for 10 days.  She reports she continues to have drainage from the right abdomen, she had approximately 5 cc of fluid aspirated from this area at her last appointment 3 days ago.  She has continued to wear compressive garments.  She reports she is not having any issues with her breasts, has continued to wear compressive garments.  Chaperone present on exam On exam umbilicus is viable, abdomen is flat, no large subcutaneous fluid collections noted.  Redness along the abdominal incision is significantly improved.  She does have some drainage along the right abdominal incision, a small wound with a pinpoint opening and some thinning skin. The drainage from the area is cloudy serous drainage, I do not appreciate any foul odors. There is no surrounding cellulitic changes.  No fluctuance is noted. Lateral abdominal incisions are intact and healing well.  On exam of her bilateral breast, NAC's are viable, incisions are intact and healing well.  She does have a maculopapular rash over the majority of the bilateral breasts, there is no surrounding erythema or cellulitic changes.  There is no active drainage from the breast incisions.   A/P:  In regards to the patient's abdomen, she continues to improve.  We will extend her doxycycline for 1 more week given the ongoing cloudy drainage that is still present.  Patient was agreeable to this, she continues to tolerate the doxycycline without any  issues.  Recommend continuing to monitor the area for worsening symptoms, recommend keeping the right abdominal wound covered with gauze.  We discussed that it will likely continue to drain over the next few days to week.  With palpation today, I was able to express approximately 5 to 10 cc of serous cloudy drainage.  In regards to her breast, she has a maculopapular rash that is present bilaterally, I suspect this may be related to her compression garment or the silicone scar cream or silicone scar strips.  I recommend she apply the silicone scar cream to her hand or wrist to determine if that is the etiology.  I would like to see the patient back in 1 week for reevaluation. Pictures were taken and placed in her chart with her permission.  She knows to call with questions or concerns.

## 2022-02-24 ENCOUNTER — Ambulatory Visit (INDEPENDENT_AMBULATORY_CARE_PROVIDER_SITE_OTHER): Payer: Self-pay | Admitting: Surgical

## 2022-02-24 ENCOUNTER — Encounter: Payer: Self-pay | Admitting: Surgical

## 2022-02-24 VITALS — BP 136/82 | HR 74

## 2022-02-24 DIAGNOSIS — N62 Hypertrophy of breast: Secondary | ICD-10-CM

## 2022-02-24 DIAGNOSIS — L03311 Cellulitis of abdominal wall: Secondary | ICD-10-CM

## 2022-02-24 DIAGNOSIS — Z411 Encounter for cosmetic surgery: Secondary | ICD-10-CM

## 2022-02-24 NOTE — Progress Notes (Signed)
Referring Provider Pahwani, Michell Heinrich, MD [address] [place],  [place] [address]   CC: No chief complaint on file.     Gwendolyn Ramos is an 57 y.o. female.  HPI: Patient is a 57 year old female here for follow-up after bilateral breast reduction and abdominoplasty with Dr. Marla Roe on 01/13/2022.  Postoperatively she did well, however had a fluid collection that became infected, cultures grew staph aureus, pansensitive.  She underwent needle aspiration via ultrasound guidance with IR.  She has been on doxycycline which has significantly improved her symptoms.  She was last seen in the clinic on 02/17/2022, at that time she was overall doing well, continued to have some mild redness.  She also had a maculopapular rash of the bilateral breasts.  She reports today that the rash of her breasts is much better, feels as if it was related to wearing her compression bra 24/7.  She used over-the-counter steroid ointment for a few days.  She reports she still has some mild tenderness of the lateral breasts where liposuction was performed.  She is otherwise not having any issues.  In regards to her abdomen, she has been applying Medihoney to the right abdominal wound, this is improving quite a bit.  She has completed her doxycycline, she was on this for approximately 2-1/2 weeks.  She is not having any infectious symptoms today.  She has continued to wear compressive garments of her abdomen.  She has some questions about exercising and returning to normal activities  Review of Systems General: No fevers or chills  Physical Exam    02/15/2022    8:55 AM 02/08/2022    1:05 PM 02/04/2022    8:16 AM  Vitals with BMI  Height '5\' 9"'$     Weight 190 lbs    BMI 41.28    Systolic [phone]  Diastolic 78 84 87  Pulse 71 85 80    General:  No acute distress,  Alert and oriented, Non-Toxic, Normal speech and affect Breast: Bilateral NAC's are viable, bilateral breast incisions are  intact and healing well.  There is no erythema or cellulitic changes of either breast noted.  Rash not present.  No subcutaneous fluid collection noted palpation.  Abdomen: Umbilicus is viable, umbilical incision is intact.  There is no subcutaneous fluid collection noted of her abdomen.  Left and right lateral abdominal incisions are intact and healing well.  She does have a wound of the right midline abdomen that is pinpoint.  I do not see any active drainage today.  Just to the left of midline she has some thinning of the abdominal incision.  She does have some mild irritation surrounding the central abdominal incision, however this appears more irritated than infected at this time.  There is no signs of overt infection.  There is no tenderness of her abdomen with palpation.   Assessment/Plan Status post breast reduction and abdominoplasty, she is 6 weeks postop.  She does not seem to have any additional swelling or signs of infection at this time.  The redness is continuing to slowly improve, at this time appears more irritative due to the dressings than infected.  She is feeling very well, no infectious systemic symptoms.  She has no tenderness with palpation.  We will not continue her antibiotics at this time, recommend continuing to monitor the area.  She is traveling to [place] in [place] this week and will return in approximately 6 days.  We will plan  to see her back on 03/04/2022.  She knows to call with any questions or concerns or if her symptoms change or worsen.  We discussed upper body exercise at this time with no restrictions, continue to avoid any lower body exercises to prevent additional swelling of the lower abdomen or risk of the wound opening larger.  Recommend continue with Medihoney, 4 x 4 gauze, Mepilex border dressing to abdominal wound.  Pictures were obtained of the patient and placed in the chart with the patient's or guardian's permission.   Carola Rhine Jil Penland 02/24/2022,  12:05 PM

## 2022-02-25 ENCOUNTER — Encounter: Payer: BC Managed Care – PPO | Admitting: Surgical

## 2022-03-04 ENCOUNTER — Ambulatory Visit (INDEPENDENT_AMBULATORY_CARE_PROVIDER_SITE_OTHER): Payer: Self-pay | Admitting: Physician Assistant

## 2022-03-04 ENCOUNTER — Encounter: Payer: Self-pay | Admitting: Physician Assistant

## 2022-03-04 VITALS — BP 121/78 | HR 75

## 2022-03-04 DIAGNOSIS — Z411 Encounter for cosmetic surgery: Secondary | ICD-10-CM

## 2022-03-04 NOTE — Progress Notes (Signed)
This is a 57 year old female who is status post bilateral breast reduction and abdominoplasty by Dr. Marla Roe on 01/13/2022.  She was last seen in the office on 02/24/2022.  She is approximately 7 weeks postop at this point.  Postoperatively she did well, she had no significant issues with her breast.  She developed a fluid collection in her abdomen that became infected, she underwent needle aspiration via ultrasound guidance with IR.  She completed doxycycline.  At her last office visit she had no signs of infection and was doing well.  She was using Medihoney on a wound to the right lateral incision.  She notes that since her last office visit she has had no significant issues.  She continues to the [place].  She is using silicone on her breasts and lateral abdominal incision.  Chaperone present.  On exam bilateral breasts are symmetric with no overlying redness incisions are clean dry and intact.  Abdominal incision clean dry and intact with a small wound along the right lateral aspect, no significant surrounding redness discharge or warmth.  Small area of firmness around the wound.  Remainder of abdomen soft with no palpable fluid collections.    Overall the patient is doing well, she has no signs of infection on exam.  Her wound is doing without significant issue.  The patient had questions regarding activity level, she may slightly increase her activity level with some step ups, no deep squatting or strenuous activity at this point.  I like to see her back in our office in 2 weeks for repeat evaluation at that point we will consider more lenient activity level.  She understands to reach out to [place] immediately if she develops any new or worsening signs or symptoms.  She had no further questions or concerns at today's visit.

## 2022-03-10 ENCOUNTER — Encounter: Payer: Self-pay | Admitting: Surgical

## 2022-03-10 ENCOUNTER — Ambulatory Visit (INDEPENDENT_AMBULATORY_CARE_PROVIDER_SITE_OTHER): Payer: BC Managed Care – PPO | Admitting: Surgical

## 2022-03-10 VITALS — BP 124/79 | HR 80

## 2022-03-10 DIAGNOSIS — Z411 Encounter for cosmetic surgery: Secondary | ICD-10-CM

## 2022-03-10 NOTE — Progress Notes (Signed)
Botulinum Toxin Procedure Note  Procedure: Cosmetic botulinum toxin  Pre-operative Diagnosis: Dynamic rhytides  Post-operative Diagnosis: Same  Complications:  None  Brief history: The patient desires botulinum toxin injection.  She is aware of the risks including bleeding, damage to deeper structures, asymmetry, brow ptosis, eyelid ptosis, bruising. The patient understands and wishes to proceed.  Patient reports she has had Botox for many years, she reports that she prefers to have her forehead injected in a specific pattern and reports that at her last Botox appointment with Dr. Claudia Desanctis she had a great result and would like to replicate that if possible.  She had 40 units at that time.  She reports that she does not like her glabella area injected, she feels as if it creates a indentation on her forehead just above the 11s.  She is comfortable with not injecting the glabellar area today and would like to focus more on the lateral canthal lines and the forehead.  I discussed with the patient I am agreeable to this, however she is aware that not injected in the glabellar can cause pulling down of the forehead due to the glabellar not being relaxed.  She would like to proceed anyways and if she needs a touchup she is happy to come back.  Procedure: The area was prepped with alcohol and dried with a clean gauze.  Using a clean technique the botulinum toxin was diluted with 2.5 mL of bacteriostatic saline per 100 unit vial which resulted in 4 units per 0.1 mL.  Subsequently the mixture was injected in the lateral canthal lines, forehead area with preservation of the temporal branch to the lateral eyebrow. A total of 40 Units of botulinum toxin was used. The forehead area was injected with care to inject intramuscular only while holding pressure on the supratrochlear vessels in each area during each injection on either side of the medial corrugators. The injection proceeded vertically superiorly to the  medial 2/3 of the frontalis muscle and superior 2/3 of the lateral frontalis, again with preservation of the frontal branch.  Pictures were placed in her chart where injections were placed.  No complications were noted. Light pressure was held for 5 minutes. She was instructed explicitly in post-operative care.  Botox LOT:  [id number] EXP:  03/2024

## 2022-03-18 ENCOUNTER — Encounter: Payer: BC Managed Care – PPO | Admitting: Physician Assistant

## 2022-03-21 ENCOUNTER — Ambulatory Visit (INDEPENDENT_AMBULATORY_CARE_PROVIDER_SITE_OTHER): Payer: BC Managed Care – PPO | Admitting: Obstetrics & Gynecology

## 2022-03-21 ENCOUNTER — Encounter (HOSPITAL_BASED_OUTPATIENT_CLINIC_OR_DEPARTMENT_OTHER): Payer: Self-pay | Admitting: Obstetrics & Gynecology

## 2022-03-21 VITALS — BP 123/84 | HR 86 | Ht 69.0 in

## 2022-03-21 DIAGNOSIS — T8189XA Other complications of procedures, not elsewhere classified, initial encounter: Secondary | ICD-10-CM

## 2022-03-21 DIAGNOSIS — R8290 Unspecified abnormal findings in urine: Secondary | ICD-10-CM

## 2022-03-21 DIAGNOSIS — B009 Herpesviral infection, unspecified: Secondary | ICD-10-CM

## 2022-03-21 DIAGNOSIS — Z01419 Encounter for gynecological examination (general) (routine) without abnormal findings: Secondary | ICD-10-CM | POA: Diagnosis not present

## 2022-03-21 DIAGNOSIS — D472 Monoclonal gammopathy: Secondary | ICD-10-CM

## 2022-03-21 DIAGNOSIS — L900 Lichen sclerosus et atrophicus: Secondary | ICD-10-CM

## 2022-03-21 DIAGNOSIS — N952 Postmenopausal atrophic vaginitis: Secondary | ICD-10-CM

## 2022-03-21 LAB — URINALYSIS
Bilirubin, UA: NEGATIVE
Glucose, UA: NEGATIVE
Ketones, UA: NEGATIVE
Leukocytes,UA: NEGATIVE
Nitrite, UA: NEGATIVE
Protein,UA: NEGATIVE
RBC, UA: NEGATIVE
Specific Gravity, UA: 1.009 (ref 1.005–1.030)
Urobilinogen, Ur: 0.2 mg/dL (ref 0.2–1.0)
pH, UA: 6.5 (ref 5.0–7.5)

## 2022-03-21 MED ORDER — VALACYCLOVIR HCL 500 MG PO TABS: 500.0000 mg | ORAL_TABLET | Freq: Every day | ORAL | 4 refills | Status: DC

## 2022-03-21 MED ORDER — ESTRADIOL 0.1 MG/GM VA CREA: TOPICAL_CREAM | VAGINAL | 3 refills | Status: DC

## 2022-03-21 NOTE — Progress Notes (Unsigned)
57 y.o. [id number] Married White or Caucasian female here for annual exam.  Doing well.  Had breast reduction and abdominoplasty 01/13/2022.  Denies vaginal bleeding.  Had posterior repair and TVT 10/2020.    Sister with lobular breast cancer and is age 56.  She has been seen at [facility].  It was genetic testing negative.    Seeing a cardiologist due to recent coronary CT that was done 11/17/2021.  Possible right coronary artery score.    Hasn't had intercourse recently due to surgery but when she did, had some bleeding.  Also, has had two urine tests (clean catch) with squamous epithelial cells and white cells.  H/o lichen sclerosus.  Uses topical betamethasone.  Does not need RF.  No LMP recorded (lmp unknown). Patient is postmenopausal.          Sexually active: Yes.    The current method of family planning is post menopausal status.    Smoker:  no  Health Maintenance: Pap:  03/12/2021 Negative History of abnormal Pap:  no MMG:  04/27/2021 Negative Colonoscopy:  02/2015 follow up 10 years BMD:   plan around age 32 Screening Labs: had functional health lab work done, this was mildly elevated   reports that she has never smoked. She has never used smokeless tobacco. She reports current alcohol use of about 3.0 standard drinks of alcohol per week. She reports that she does not use drugs.  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 10/21/20    Past Surgical History:  Procedure Laterality Date   BLADDER SUSPENSION N/A 10/26/2020   Procedure: TRANSVAGINAL TAPE (TVT) PROCEDURE;  Surgeon: Jaquita Folds, MD;  Location: [facility];  Service: Gynecology;  Laterality: N/A;   BREAST REDUCTION SURGERY Bilateral 01/13/2022   wtih abdominoplasty   BUNIONECTOMY Bilateral    CHOLECYSTECTOMY  2001   CYSTOSCOPY N/A 10/26/2020   Procedure: CYSTOSCOPY;  Surgeon: Jaquita Folds, MD;  Location: [facility];  Service: Gynecology;  Laterality: N/A;   NASAL SEPTUM SURGERY     RECTOCELE REPAIR N/A 10/26/2020   Procedure: POSTERIOR REPAIR (RECTOCELE) with perineorrhaphy;  Surgeon: Jaquita Folds, MD;  Location: [facility];  Service: Gynecology;  Laterality: N/A;  total time requested for all procedures is 1.5 hours   TONSILLECTOMY      Current Outpatient Medications  Medication Sig Dispense Refill   betamethasone dipropionate 0.05 % cream Apply topically 2 (two) times daily.     buPROPion (WELLBUTRIN SR) 150 MG 12 hr tablet Take 150 mg by mouth 2 (two) times daily.     estradiol (ESTRACE) 0.1 MG/GM vaginal cream Place 1 Applicatorful vaginally 3 (three) times a week.  Multiple Vitamins-Minerals (MULTIVITAMIN WITH MINERALS) tablet Take 1 tablet by mouth daily.     NON FORMULARY Calories 5     NON FORMULARY On guard     Omega-3 Fatty Acids (FISH OIL) 1000 MG CAPS Take by mouth.     omeprazole (PRILOSEC) 20 MG capsule Take 20 mg by mouth daily.     ondansetron (ZOFRAN) 4 MG tablet Take 1 tablet (4 mg total) by mouth every 8 (eight) hours as needed for nausea or vomiting. 20 tablet 0   OVER THE COUNTER MEDICATION Vegan micro VM -Doterra     OVER THE COUNTER MEDICATION Iron 65 mg-Take 1 table by mouth every other day.     OVER THE COUNTER MEDICATION Magnesium '325mg'$ -Take 2  capsule by mouth daily.     OZEMPIC, 0.25 OR 0.5 MG/DOSE, 2 MG/3ML SOPN SMARTSIG:0.25 Milligram(s) SUB-Q Once a Week     TART CHERRY PO Take 1 tablet by mouth daily.     topiramate (TOPAMAX) 100 MG tablet Take 1 tablet (100 mg total) by mouth at bedtime. 30 tablet 11   Ubrogepant (UBRELVY) 100 MG TABS Take 1 tablet by mouth as needed (May repeat in 2 hours if needed.  Maximum 2 tablets in 24 hours.). 10 tablet 11   valACYclovir (VALTREX) 500 MG tablet Take 1 tablet (500 mg total) by mouth daily. 90 tablet 4   No current facility-administered medications for this visit.    Family History  Problem Relation Age of Onset   Diabetes Mother    COPD Mother    Congestive Heart Failure Mother    Thyroid disease Mother    Hemochromatosis Father    Alcoholism Father    Breast cancer Sister 33       invasive lobular breast cancer, negative genetic testing   Diabetes Sister    Asthma Sister    Asthma Brother    Diabetes Maternal Aunt    Breast cancer Maternal Aunt    Diabetes Other     ROS: Constitutional: negative Genitourinary: vaginal bleeding intercourse  Exam:   BP 123/84 (BP Location: Right Arm, Patient Position: Sitting, Cuff Size: Large)   Pulse 86   Ht '5\' 9"'$  (1.753 m) Comment: Reported  LMP  (LMP Unknown) Comment: patient had spotting in October 2020  BMI 28.06 kg/m   Height: '5\' 9"'$  (175.3 cm) (Reported)  General appearance: alert, cooperative and appears stated age Head: Normocephalic, without obvious abnormality, atraumatic Neck: no adenopathy, supple, symmetrical, trachea midline and thyroid normal to inspection and palpation Lungs: clear to auscultation bilaterally Breasts: normal appearance, no masses or tenderness Heart: regular rate and rhythm Abdomen: soft, non-tender; bowel sounds normal; no masses,  no organomegaly Extremities: extremities normal, atraumatic, no cyanosis or edema Skin: Skin color, texture, turgor normal. No rashes or lesions Lymph nodes:  Cervical, supraclavicular, and axillary nodes normal. No abnormal inguinal nodes palpated Neurologic: Grossly normal   Pelvic: External genitalia:  no lesions              Urethra:  normal appearing urethra with no masses, tenderness or lesions              Bartholins and Skenes: normal                 Vagina: normal appearing vagina with normal color and no discharge, no lesions              Cervix: no lesions  Pap taken: No. Bimanual Exam:  Uterus:  normal size, contour, position, consistency, mobility, non-tender              Adnexa: normal adnexa and no mass, fullness, tenderness               Rectovaginal: Confirms               Anus:  normal sphincter tone, no lesions  Chaperone, Octaviano Batty, CMA, was present for exam.  Assessment/Plan:

## 2022-03-22 ENCOUNTER — Ambulatory Visit (INDEPENDENT_AMBULATORY_CARE_PROVIDER_SITE_OTHER): Payer: Self-pay | Admitting: Physician Assistant

## 2022-03-22 ENCOUNTER — Encounter: Payer: Self-pay | Admitting: Physician Assistant

## 2022-03-22 VITALS — BP 142/86 | HR 80

## 2022-03-22 DIAGNOSIS — Z411 Encounter for cosmetic surgery: Secondary | ICD-10-CM

## 2022-03-22 NOTE — Progress Notes (Signed)
This is a 56 year old female who is status post bilateral breast reduction and abdominoplasty by Dr. Marla Roe on 01/13/2022.  She was last seen in the office on 03/04/2022.  At this point she is approximately 9 weeks postop.  Postoperatively she did well but she did develop some lower abdominal fluid accumulation and a wound along the right abdominal incision.  She was seen by interventional radiology and had needle aspiration via ultrasound guidance.  She completed a course of doxycycline.  The wound continued to improve.  At her last office visit she had no signs of infections and was doing well.  She had been using Medihoney.  Since her last office visit she notes that the wound has healed with the exception of 1 small area that is nonhealing and continues to drain.  She also notes some irritation under the left breast, she has had no new exposures or traumas to this area.  She notes some firmness in the right breast which has gotten smaller and softer with massage.  Chaperone present.  On exam bilateral NAC's are viable, incisions are clean dry and intact, the left breast has redness and irritation along the lower portion, she has an area of firmness along the right lateral breast.  Abdominal incision is clean dry and intact with the exception of a small wound along the right mid lateral region, no purulence or discharge at the moment.  No surrounding redness.  Dr. Marla Roe had the opportunity to personally evaluate the patient today.  Given the continued wound she was given options including attempts at other wound healing products versus repeat wound closure.  The patient would like to proceed with a repeat closure.  She will be scheduled in the office within the next several weeks.  Dr. Marla Roe has recommended hydrocortisone cream for the left breast if this does not work she may need to increase the concentration of the cream.  The patient will be seen in the office as scheduled she had no  further questions or concerns at today's visit.

## 2022-03-27 NOTE — Progress Notes (Unsigned)
Cardiology Office Note:   Date:  03/28/2022  NAME:  Gwendolyn Ramos    MRN: [id number] DOB:  Aug 08, 1965   PCP:  Mckinley Jewel, MD  Cardiologist:  None  Electrophysiologist:  None   Referring MD: Mckinley Jewel, MD   Chief Complaint  Patient presents with   Chest Pain    History of Present Illness:   [name] is a 57 y.o. female with a hx of HLD who is being seen today for the evaluation of chest pain at the request of Pahwani, Rinka R, MD. she reports that she recently had coronary calcium scoring.  Value initially read is 0.  On repeat evaluation she may have a calcium score of 0.50 in the RCA which places her in the 72nd percentile.  She reports that she has had high cholesterol.  LDL is going up.  She is working on diet and exercising.  She was seen in the emergency room in March 2023 with chest pain symptoms.  Apparently this also occurred in the setting of a migraine.  Troponins were negative.  EKG was normal.  She reports she had center chest.  Also described as achy.  She is intermittently had some of these episodes over the past year.  Symptoms occur randomly.  Last seconds.  No shortness of breath.  No identifiable triggers.  She overall describes himself as healthy.  Pursues a healthy diet.  Does exercise regularly.  Symptoms of chest discomfort do not occur with activity.  She is married.  She has 2 children.  She does not smoke.  Alcohol moderation.  No drug use.  She is a stay-at-home mom.  She has 2 daughters in their early 21s.  Her EKG today in office is normal.  She did have extensive preventive testing through an outside company.  LP(a) 11.  LDL cholesterol 143.  CRP value negative.  Recent thyroid studies were normal.  Her CV exam is normal.  She does report a family history of heart disease in her maternal grandfather.  She reports her brother may have a heart issue but he is unclear of the details.  She also reports that her to have breast cancer.  It  was apparently a very tricky form of breast cancer that was not found on imaging.  She does have MGUS.  This is well-controlled.  Continue ASCVD risk or is low at 2.2%.  Lp(a) 11 T chol 120, HDL 52, LDL 143, TG 130 High-sensitivity CRP 1.1   Problem List HLD -CAC = 0.5 (72nd percentile) 2. 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 10/21/20    Past Surgical History: Past Surgical History:  Procedure Laterality Date  BLADDER SUSPENSION N/A 10/26/2020   Procedure: TRANSVAGINAL TAPE (TVT) PROCEDURE;  Surgeon: Jaquita Folds, MD;  Location: [facility];  Service: Gynecology;  Laterality: N/A;   BREAST REDUCTION SURGERY Bilateral 01/13/2022   wtih abdominoplasty   BUNIONECTOMY Bilateral    CHOLECYSTECTOMY  2001   CYSTOSCOPY N/A 10/26/2020   Procedure: CYSTOSCOPY;  Surgeon: Jaquita Folds, MD;  Location: [facility];  Service: Gynecology;  Laterality: N/A;   NASAL SEPTUM SURGERY     RECTOCELE REPAIR N/A 10/26/2020   Procedure: POSTERIOR REPAIR (RECTOCELE) with perineorrhaphy;  Surgeon: Jaquita Folds, MD;  Location: [facility];  Service: Gynecology;  Laterality: N/A;  total time requested for all procedures is 1.5 hours   TONSILLECTOMY      Current  Medications: Current Meds  Medication Sig   betamethasone dipropionate 0.05 % cream Apply topically 2 (two) times daily.   buPROPion (WELLBUTRIN SR) 150 MG 12 hr tablet Take 150 mg by mouth 2 (two) times daily.   estradiol (ESTRACE) 0.1 MG/GM vaginal cream 1 gram pv twice weekly   metoprolol tartrate (LOPRESSOR) 100 MG tablet Take 1 tablet by mouth once for procedure.   Multiple Vitamins-Minerals (MULTIVITAMIN WITH MINERALS) tablet Take 1 tablet by mouth daily.   NON FORMULARY Calories 5   NON FORMULARY On guard   Omega-3 Fatty Acids (FISH OIL) 1000 MG CAPS Take by mouth.   omeprazole (PRILOSEC) 20 MG capsule Take 20 mg by mouth daily.   ondansetron (ZOFRAN) 4 MG tablet Take 1 tablet (4 mg total) by mouth every 8 (eight) hours as needed for nausea or vomiting.   OVER THE COUNTER MEDICATION Vegan micro VM -Doterra   OVER THE COUNTER MEDICATION Iron 65 mg-Take 1 table by mouth every other day.   OVER THE COUNTER MEDICATION Magnesium '325mg'$ -Take 2 capsule by mouth daily.   OZEMPIC, 0.25 OR 0.5 MG/DOSE, 2 MG/3ML SOPN SMARTSIG:0.25 Milligram(s) SUB-Q Once a Week   sulfamethoxazole-trimethoprim (BACTRIM DS) 800-160 MG tablet Take 1 tablet by mouth 2 (two) times daily.   TART CHERRY PO Take 1 tablet by mouth daily.   topiramate (TOPAMAX) 100 MG tablet Take 1 tablet (100 mg total) by mouth at bedtime.   Ubrogepant (UBRELVY) 100 MG TABS Take 1 tablet by mouth as needed (May repeat in 2 hours if needed.  Maximum 2 tablets in 24 hours.).   valACYclovir (VALTREX) 500 MG tablet Take 1 tablet (500 mg total) by mouth daily.     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   Tobacco Use   Smoking status: Never   Smokeless tobacco: Never  Vaping Use   Vaping Use: Never used  Substance and Sexual Activity   Alcohol use: Yes    Alcohol/week: 3.0 standard drinks of alcohol    Types: 3 Glasses of wine per week   Drug use: Never   Sexual activity: Not Currently    Birth control/protection: None  Other Topics Concern   Not on file  Social History Narrative   Right handed    Two story home   Drinks caffeine   Social Determinants of Health   Financial Resource Strain:  Not on file  Food Insecurity: Not on file  Transportation Needs: Not on file  Physical Activity: Not on file  Stress: Not on file  Social Connections: Not on file     Family History: The patient's family history includes Alcoholism in her father; Asthma in her brother and sister; Breast cancer in her maternal aunt; Breast cancer (age of onset: 30) in her sister; COPD in her mother; Congestive Heart Failure in her mother; Diabetes in her maternal aunt, mother, sister, and another family member; Heart attack in her maternal grandfather; Hemochromatosis in her father; Thyroid disease in her mother.  ROS:   All other ROS reviewed and negative. Pertinent positives noted in the HPI.     EKGs/Labs/Other Studies Reviewed:   The following studies were personally reviewed by me today:  EKG:  EKG is ordered today.  The ekg ordered today demonstrates normal sinus rhythm heart rate 82, no acute ischemic changes or evidence of infarction, and was personally reviewed by me.   Recent Labs: 12/07/2021: ALT 21; BUN 21; Creatinine 1.07; Hemoglobin 14.1; Platelet Count 331; Potassium 4.4; Sodium 137   Recent Lipid Panel No results found for: "CHOL", "TRIG", "HDL", "CHOLHDL", "VLDL", "LDLCALC", "LDLDIRECT"  Physical Exam:   VS:  BP 122/83 (BP Location: Left Arm, Patient Position: Sitting, Cuff Size: Normal)   Pulse 82   Ht '5\' 9"'$  (1.753 m)   Wt 188 lb (85.3 kg)   LMP  (LMP Unknown) Comment: patient  had spotting in October 2020  SpO2 98%   BMI 27.76 kg/m    Wt Readings from Last 3 Encounters:  03/28/22 188 lb (85.3 kg)  02/15/22 190 lb (86.2 kg)  01/11/22 195 lb (88.5 kg)    General: Well nourished, well developed, in no acute distress Head: Atraumatic, normal size  Eyes: PEERLA, EOMI  Neck: Supple, no JVD Endocrine: No thryomegaly Cardiac: Normal S1, S2; RRR; no murmurs, rubs, or gallops Lungs: Clear to auscultation bilaterally, no wheezing, rhonchi or rales  Abd: Soft, nontender, no hepatomegaly  Ext: No edema, pulses 2+ Musculoskeletal: No deformities, BUE and BLE strength normal and equal Skin: Warm and dry, no rashes   Neuro: Alert and oriented to person, place, time, and situation, CNII-XII grossly intact, no focal deficits  Psych: Normal mood and affect   ASSESSMENT:   Jaylianna Picklesimer is a 57 y.o. female who presents for the following: 1. Precordial pain   2. Agatston coronary artery calcium score less than 100   3. Mixed hyperlipidemia     PLAN:   1. Precordial pain 2. Agatston coronary artery calcium score less than 100 3. Mixed hyperlipidemia -Coronary calcium score possibly of 0.5 which is 72nd percentile.  Apparently this was found on repeat evaluation of her images.  I looked at her images and is unclear if she actually has coronary calcium.  She does report intermittent episodes of chest tightness.  Does not appear to occur with exertion.  She does have a family history of heart disease and elevated LDL cholesterol.  We discussed coronary CTA for definitive evaluation.  This will look at calcified and noncalcified plaque.  She is interested.  We will need a BMP today.  She will take 100 mg metoprolol tartrate to before the scan.  This will give [place] definitive evaluation of her heart to determine if her symptoms truly are cardiac or not.  Overall, her 10-year risk for a cardiac event is low at 2.2%.  Her LP(a) is 11.  Her LDL  cholesterol is 143.  For now  would recommend diet and exercise.  We will further guide her therapy based on her coronary CTA.  Disposition: Return if symptoms worsen or fail to improve.  Medication Adjustments/Labs and Tests Ordered: Current medicines are reviewed at length with the patient today.  Concerns regarding medicines are outlined above.  Orders Placed This Encounter  Procedures   CT CORONARY MORPH W/CTA COR W/SCORE W/CA W/CM &/OR WO/CM   Basic metabolic panel   EKG [id number]   Meds ordered this encounter  Medications   metoprolol tartrate (LOPRESSOR) 100 MG tablet    Sig: Take 1 tablet by mouth once for procedure.    Dispense:  1 tablet    Refill:  0    Patient Instructions  Medication Instructions:  Take Metoprolol 100 mg two hours before CT scan when scheduled.   *If you need a refill on your cardiac medications before your next appointment, please call your pharmacy*   Lab Work: BMET today   If you have labs (blood work) drawn today and your tests are completely normal, you will receive your results only by: [place] (if you have MyChart) OR A paper copy in the mail If you have any lab test that is abnormal or we need to change your treatment, we will call you to review the results.   Testing/Procedures: Coronary CTA- they will call you to schedule.    Follow-Up: At [facility], you and your health needs are our priority.  As part of our continuing mission to provide you with exceptional heart care, we have created designated Provider Care Teams.  These Care Teams include your primary Cardiologist (physician) and Advanced Practice Providers (APPs -  Physician Assistants and Nurse Practitioners) who all work together to provide you with the care you need, when you need it.  We recommend signing up for the patient portal called "MyChart".  Sign up information is provided on this After Visit Summary.  MyChart is used to connect with patients for Virtual Visits (Telemedicine).   Patients are able to view lab/test results, encounter notes, upcoming appointments, etc.  Non-urgent messages can be sent to your provider as well.   To learn more about what you can do with MyChart, go to [email or web address].    Your next appointment:   As needed  Provider:   Eleonore Chiquito, MD  Other Instructions   Your cardiac CT will be scheduled at one of the below locations:   [facility] [address] [place], [place] [address] [phone]   If scheduled at [facility], please arrive at the [facility] and Children's Entrance (Entrance C2) of [facility] 30 minutes prior to test start time. You can use the FREE valet parking offered at entrance C (encouraged to control the heart rate for the test)  Proceed to the [facility] Radiology Department (first floor) to check-in and test prep.  All radiology patients and guests should use entrance C2 at [facility], accessed from [facility], even though the hospital's physical address listed is [address]     Please follow these instructions carefully (unless otherwise directed):   On the Night Before the Test: Be sure to Drink plenty of water. Do not consume any caffeinated/decaffeinated beverages or chocolate 12 hours prior to your test. Do not take any antihistamines 12 hours prior to your test.  On the Day of the Test: Drink plenty of water until 1 hour prior  to the test. Do not eat any food 1 hour prior to test. You may take your regular medications prior to the test.  Take metoprolol (Lopressor) two hours prior to test. If you take Furosemide/Hydrochlorothiazide/Spironolactone, please HOLD on the morning of the test. FEMALES- please wear underwire-free bra if available, avoid dresses & tight clothing  After the Test: Drink plenty of water. After receiving IV contrast, you may experience a mild flushed feeling. This is normal. On occasion, you may  experience a mild rash up to 24 hours after the test. This is not dangerous. If this occurs, you can take Benadryl 25 mg and increase your fluid intake. If you experience trouble breathing, this can be serious. If it is severe call 911 IMMEDIATELY. If it is mild, please call our office. If you take any of these medications: Glipizide/Metformin, Avandament, Glucavance, please do not take 48 hours after completing test unless otherwise instructed.  We will call to schedule your test 2-4 weeks out understanding that some insurance companies will need an authorization prior to the service being performed.   For non-scheduling related questions, please contact the cardiac imaging nurse navigator should you have any questions/concerns: Marchia Bond, Cardiac Imaging Nurse Navigator Gordy Clement, Cardiac Imaging Nurse Navigator [facility] Direct Office Dial: [phone]   For scheduling needs, including cancellations and rescheduling, please call [place], [phone].    Signed, Addison Naegeli. Audie Box, MD, [place]  [address] [place], [place] [address] [phone]  03/28/2022 10:25 AM

## 2022-03-28 ENCOUNTER — Ambulatory Visit: Payer: BC Managed Care – PPO | Attending: Cardiovascular Disease | Admitting: Cardiovascular Disease

## 2022-03-28 ENCOUNTER — Encounter: Payer: Self-pay | Admitting: Cardiovascular Disease

## 2022-03-28 VITALS — BP 122/83 | HR 82 | Ht 69.0 in | Wt 188.0 lb

## 2022-03-28 DIAGNOSIS — E782 Mixed hyperlipidemia: Secondary | ICD-10-CM

## 2022-03-28 DIAGNOSIS — R931 Abnormal findings on diagnostic imaging of heart and coronary circulation: Secondary | ICD-10-CM | POA: Diagnosis not present

## 2022-03-28 DIAGNOSIS — R072 Precordial pain: Secondary | ICD-10-CM

## 2022-03-28 LAB — BASIC METABOLIC PANEL
BUN/Creatinine Ratio: 9 (ref 9–23)
BUN: 10 mg/dL (ref 6–24)
CO2: 18 mmol/L — ABNORMAL LOW (ref 20–29)
Calcium: 10.4 mg/dL — ABNORMAL HIGH (ref 8.7–10.2)
Chloride: 104 mmol/L (ref 96–106)
Creatinine, Ser: 1.11 mg/dL — ABNORMAL HIGH (ref 0.57–1.00)
Glucose: 85 mg/dL (ref 70–99)
Potassium: 4.7 mmol/L (ref 3.5–5.2)
Sodium: 136 mmol/L (ref 134–144)
eGFR: 58 mL/min/{1.73_m2} — ABNORMAL LOW (ref >59)

## 2022-03-28 MED ORDER — METOPROLOL TARTRATE 100 MG PO TABS: ORAL_TABLET | ORAL | 0 refills | Status: DC

## 2022-03-28 NOTE — Patient Instructions (Signed)
Medication Instructions:  Take Metoprolol 100 mg two hours before CT scan when scheduled.   *If you need a refill on your cardiac medications before your next appointment, please call your pharmacy*   Lab Work: BMET today   If you have labs (blood work) drawn today and your tests are completely normal, you will receive your results only by: [place] (if you have MyChart) OR A paper copy in the mail If you have any lab test that is abnormal or we need to change your treatment, we will call you to review the results.   Testing/Procedures: Coronary CTA- they will call you to schedule.    Follow-Up: At [facility], you and your health needs are our priority.  As part of our continuing mission to provide you with exceptional heart care, we have created designated Provider Care Teams.  These Care Teams include your primary Cardiologist (physician) and Advanced Practice Providers (APPs -  Physician Assistants and Nurse Practitioners) who all work together to provide you with the care you need, when you need it.  We recommend signing up for the patient portal called "MyChart".  Sign up information is provided on this After Visit Summary.  MyChart is used to connect with patients for Virtual Visits (Telemedicine).  Patients are able to view lab/test results, encounter notes, upcoming appointments, etc.  Non-urgent messages can be sent to your provider as well.   To learn more about what you can do with MyChart, go to [email or web address].    Your next appointment:   As needed  Provider:   Eleonore Chiquito, MD  Other Instructions   Your cardiac CT will be scheduled at one of the below locations:   [facility] [address] [place], [place] [address] [phone]   If scheduled at [facility], please arrive at the [facility] and Children's Entrance (Entrance C2) of [facility] 30 minutes prior to test start time. You can use the FREE valet  parking offered at entrance C (encouraged to control the heart rate for the test)  Proceed to the [facility] Radiology Department (first floor) to check-in and test prep.  All radiology patients and guests should use entrance C2 at [facility], accessed from [facility], even though the hospital's physical address listed is [address].     Please follow these instructions carefully (unless otherwise directed):   On the Night Before the Test: Be sure to Drink plenty of water. Do not consume any caffeinated/decaffeinated beverages or chocolate 12 hours prior to your test. Do not take any antihistamines 12 hours prior to your test.  On the Day of the Test: Drink plenty of water until 1 hour prior to the test. Do not eat any food 1 hour prior to test. You may take your regular medications prior to the test.  Take metoprolol (Lopressor) two hours prior to test. If you take Furosemide/Hydrochlorothiazide/Spironolactone, please HOLD on the morning of the test. FEMALES- please wear underwire-free bra if available, avoid dresses & tight clothing  After the Test: Drink plenty of water. After receiving IV contrast, you may experience a mild flushed feeling. This is normal. On occasion, you may experience a mild rash up to 24 hours after the test. This is not dangerous. If this occurs, you can take Benadryl 25 mg and increase your fluid intake. If you experience trouble breathing, this can be serious. If it is severe call 911 IMMEDIATELY. If it is mild, please  call our office. If you take any of these medications: Glipizide/Metformin, Avandament, Glucavance, please do not take 48 hours after completing test unless otherwise instructed.  We will call to schedule your test 2-4 weeks out understanding that some insurance companies will need an authorization prior to the service being performed.   For non-scheduling related questions, please contact the cardiac  imaging nurse navigator should you have any questions/concerns: Marchia Bond, Cardiac Imaging Nurse Navigator Gordy Clement, Cardiac Imaging Nurse Navigator [facility] Direct Office Dial: [phone]   For scheduling needs, including cancellations and rescheduling, please call [place], [phone].

## 2022-03-29 ENCOUNTER — Other Ambulatory Visit: Payer: Self-pay | Admitting: Cardiovascular Disease

## 2022-03-29 ENCOUNTER — Other Ambulatory Visit: Payer: Self-pay

## 2022-03-29 DIAGNOSIS — R072 Precordial pain: Secondary | ICD-10-CM

## 2022-03-30 ENCOUNTER — Encounter: Payer: BC Managed Care – PPO | Admitting: Plastic Surgery

## 2022-04-01 ENCOUNTER — Telehealth (HOSPITAL_COMMUNITY): Payer: Self-pay | Admitting: *Deleted

## 2022-04-01 NOTE — Telephone Encounter (Signed)
Reaching out to patient to offer assistance regarding upcoming cardiac imaging study; pt verbalizes understanding of appt date/time, parking situation and where to check in, pre-test NPO status and medications ordered, and verified current allergies; name and call back number provided for further questions should they arise  Chrys Landgrebe RN Navigator Cardiac Imaging [facility] [phone] office [phone] cell  Patient to take 100mg metoprolol tartrate two hours prior to her cardiac CT scan. She is aware to arrive at 11:30 am. 

## 2022-04-01 NOTE — Telephone Encounter (Signed)
Attempted to call patient regarding upcoming cardiac CT appointment. °Left message on voicemail with name and callback number ° °Samina Weekes RN Navigator Cardiac Imaging °[facility] °[phone] Office °[phone] Cell ° °

## 2022-04-05 ENCOUNTER — Ambulatory Visit (HOSPITAL_COMMUNITY)
Admission: RE | Admit: 2022-04-05 | Discharge: 2022-04-05 | Disposition: A | Discharge status: Home / Self Care | Payer: BC Managed Care – PPO | Source: Ambulatory Visit | Attending: Cardiovascular Disease | Admitting: Cardiovascular Disease

## 2022-04-05 DIAGNOSIS — R072 Precordial pain: Secondary | ICD-10-CM

## 2022-04-05 MED ORDER — IOHEXOL 350 MG/ML SOLN
100.0000 mL | Freq: Once | INTRAVENOUS | Status: AC | PRN
  Administered 2022-04-05: 100 mL via INTRAVENOUS

## 2022-04-05 MED ORDER — METOPROLOL TARTRATE 5 MG/5ML IV SOLN
INTRAVENOUS | Status: AC
  Administered 2022-04-05: 10 mg via INTRAVENOUS
  Filled 2022-04-05: qty 10

## 2022-04-05 MED ORDER — NITROGLYCERIN 0.4 MG SL SUBL
SUBLINGUAL_TABLET | SUBLINGUAL | Status: AC
  Filled 2022-04-05: qty 2

## 2022-04-05 MED ORDER — METOPROLOL TARTRATE 5 MG/5ML IV SOLN: 10.0000 mg | Freq: Once | INTRAVENOUS | Status: AC

## 2022-04-05 MED ORDER — METOPROLOL TARTRATE 5 MG/5ML IV SOLN
10.0000 mg | Freq: Once | INTRAVENOUS | Status: AC
  Administered 2022-04-05: 10 mg via INTRAVENOUS

## 2022-04-05 MED ORDER — METOPROLOL TARTRATE 5 MG/5ML IV SOLN
INTRAVENOUS | Status: AC
  Filled 2022-04-05: qty 10

## 2022-04-05 MED ORDER — NITROGLYCERIN 0.4 MG SL SUBL
0.8000 mg | SUBLINGUAL_TABLET | Freq: Once | SUBLINGUAL | Status: AC
  Administered 2022-04-05: 0.8 mg via SUBLINGUAL

## 2022-04-08 ENCOUNTER — Encounter: Payer: Self-pay | Admitting: Cardiovascular Disease

## 2022-04-08 MED ORDER — ROSUVASTATIN CALCIUM 10 MG PO TABS: 10.0000 mg | ORAL_TABLET | Freq: Every day | ORAL | 3 refills | Status: DC

## 2022-04-11 NOTE — Progress Notes (Unsigned)
[facility] [place] Phone: [phone] Subjective:   [name], am serving as a scribe for Dr. Hulan Saas.  I'm seeing this patient by the request  of:  Pahwani, Rinka R, MD  CC: Neck pain and headache follow-up  [id number]  Gwendolyn Ramos is a 57 y.o. female coming in with complaint of back and neck pain. OMT 02/15/2022. Patient states   Medications patient has been prescribed: None  Taking:         Reviewed prior external information including notes and imaging from previsou exam, outside providers and external EMR if available.   As well as notes that were available from care everywhere and other healthcare systems.  Past medical history, social, surgical and family history all reviewed in electronic medical record.  No pertanent information unless stated regarding to the chief complaint.   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 10/21/20    Allergies  Allergen Reactions   Levaquin [Levofloxacin]     High fever, elevated heart rate   Methylchloroisothiazolinone [Methylisothiazolinone]     In liquid soaps causes skin irritation.   Keflex [Cephalexin] Rash    Caused possible rash of arms, HA     Review of Systems:  No headache, visual  changes, nausea, vomiting, diarrhea, constipation, dizziness, abdominal pain, skin rash, fevers, chills, night sweats, weight loss, swollen lymph nodes, body aches, joint swelling, chest pain, shortness of breath, mood changes. POSITIVE muscle aches likely no headaches  Objective  Blood pressure 118/86, pulse 75, height 5\' 9"  (1.753 m), weight 189 lb (85.7 kg), SpO2 98 %.   General: No apparent distress alert and oriented x3 mood and affect normal, dressed appropriately.  HEENT: Pupils equal, extraocular movements intact  Respiratory: Patient's speak in full sentences and does not appear short of breath  Cardiovascular: No lower extremity edema, non tender, no erythema  MSK:  Back core strength has improved some at this time.  The patient has actually improvement also in the gluteal strength.  Neck exam still shows significant tightness bilaterally.  Mild crepitus with sidebending bilaterally.  Osteopathic findings  C3 flexed rotated and side bent right C6 flexed rotated and side bent left T3 extended rotated and side bent left inhaled rib T9 extended rotated and side bent left inhaled rib L2 flexed rotated and side bent right Sacrum right on right       Assessment and Plan:  Cervicogenic headache Patient has made significant improvement at this time.  Discussed icing regimen and home exercises.  Patient has done well with the weight loss and encouraged her to continue to do so.  Core strength is significantly improved as well.  Follow-up  with me again in 6 to 8 weeks otherwise.    Nonallopathic problems  Decision today to treat with OMT was based on Physical Exam  After verbal consent patient was treated with HVLA, ME, FPR techniques in cervical, rib, thoracic, lumbar, and sacral  areas  Patient tolerated the procedure well with improvement in symptoms  Patient given exercises, stretches and lifestyle modifications  See medications in patient instructions if given  Patient  will follow up in 4-8 weeks    The above documentation has been reviewed and is accurate and complete Lyndal Pulley, DO          Note: This dictation was prepared with Dragon dictation along with smaller phrase technology. Any transcriptional errors that result from this process are unintentional.

## 2022-04-12 ENCOUNTER — Ambulatory Visit (INDEPENDENT_AMBULATORY_CARE_PROVIDER_SITE_OTHER): Payer: BC Managed Care – PPO | Admitting: Family Medicine

## 2022-04-12 ENCOUNTER — Encounter: Payer: Self-pay | Admitting: Family Medicine

## 2022-04-12 VITALS — BP 118/86 | HR 75 | Ht 69.0 in | Wt 189.0 lb

## 2022-04-12 DIAGNOSIS — M9901 Segmental and somatic dysfunction of cervical region: Secondary | ICD-10-CM

## 2022-04-12 DIAGNOSIS — G4486 Cervicogenic headache: Secondary | ICD-10-CM

## 2022-04-12 DIAGNOSIS — M9903 Segmental and somatic dysfunction of lumbar region: Secondary | ICD-10-CM

## 2022-04-12 DIAGNOSIS — M9902 Segmental and somatic dysfunction of thoracic region: Secondary | ICD-10-CM

## 2022-04-12 DIAGNOSIS — M9908 Segmental and somatic dysfunction of rib cage: Secondary | ICD-10-CM

## 2022-04-12 DIAGNOSIS — M9904 Segmental and somatic dysfunction of sacral region: Secondary | ICD-10-CM

## 2022-04-12 NOTE — Patient Instructions (Signed)
Good to see you   

## 2022-04-12 NOTE — Assessment & Plan Note (Signed)
Patient has made significant improvement at this time.  Discussed icing regimen and home exercises.  Patient has done well with the weight loss and encouraged her to continue to do so.  Core strength is significantly improved as well.  Follow-up with me again in 6 to 8 weeks otherwise.

## 2022-04-25 ENCOUNTER — Encounter (HOSPITAL_BASED_OUTPATIENT_CLINIC_OR_DEPARTMENT_OTHER): Payer: Self-pay | Admitting: Obstetrics & Gynecology

## 2022-04-28 ENCOUNTER — Other Ambulatory Visit (HOSPITAL_BASED_OUTPATIENT_CLINIC_OR_DEPARTMENT_OTHER): Payer: Self-pay | Admitting: Obstetrics & Gynecology

## 2022-04-28 DIAGNOSIS — Z1231 Encounter for screening mammogram for malignant neoplasm of breast: Secondary | ICD-10-CM

## 2022-05-05 ENCOUNTER — Ambulatory Visit
Admission: RE | Admit: 2022-05-05 | Discharge: 2022-05-05 | Disposition: A | Discharge status: Home / Self Care | Payer: BC Managed Care – PPO | Source: Ambulatory Visit | Attending: Obstetrics & Gynecology | Admitting: Obstetrics & Gynecology

## 2022-05-05 ENCOUNTER — Ambulatory Visit: Payer: BC Managed Care – PPO

## 2022-05-05 DIAGNOSIS — Z1231 Encounter for screening mammogram for malignant neoplasm of breast: Secondary | ICD-10-CM

## 2022-05-09 ENCOUNTER — Other Ambulatory Visit: Payer: Self-pay | Admitting: Obstetrics & Gynecology

## 2022-05-09 DIAGNOSIS — R928 Other abnormal and inconclusive findings on diagnostic imaging of breast: Secondary | ICD-10-CM

## 2022-05-24 ENCOUNTER — Ambulatory Visit
Admission: RE | Admit: 2022-05-24 | Discharge: 2022-05-24 | Disposition: A | Discharge status: Home / Self Care | Payer: BC Managed Care – PPO | Source: Ambulatory Visit | Attending: Obstetrics & Gynecology | Admitting: Obstetrics & Gynecology

## 2022-05-24 DIAGNOSIS — R928 Other abnormal and inconclusive findings on diagnostic imaging of breast: Secondary | ICD-10-CM

## 2022-05-25 ENCOUNTER — Encounter: Payer: Self-pay | Admitting: Obstetrics & Gynecology

## 2022-05-25 ENCOUNTER — Other Ambulatory Visit: Payer: Self-pay | Admitting: Obstetrics & Gynecology

## 2022-05-25 DIAGNOSIS — R921 Mammographic calcification found on diagnostic imaging of breast: Secondary | ICD-10-CM

## 2022-06-06 ENCOUNTER — Telehealth: Payer: Self-pay | Admitting: Hematology and Oncology

## 2022-06-06 ENCOUNTER — Other Ambulatory Visit: Payer: Self-pay | Admitting: Physician Assistant

## 2022-06-06 DIAGNOSIS — Z8249 Family history of ischemic heart disease and other diseases of the circulatory system: Secondary | ICD-10-CM | POA: Insufficient documentation

## 2022-06-06 DIAGNOSIS — Z79899 Other long term (current) drug therapy: Secondary | ICD-10-CM | POA: Insufficient documentation

## 2022-06-06 DIAGNOSIS — Z9049 Acquired absence of other specified parts of digestive tract: Secondary | ICD-10-CM | POA: Insufficient documentation

## 2022-06-06 DIAGNOSIS — Z8616 Personal history of COVID-19: Secondary | ICD-10-CM | POA: Insufficient documentation

## 2022-06-06 DIAGNOSIS — Z8349 Family history of other endocrine, nutritional and metabolic diseases: Secondary | ICD-10-CM | POA: Insufficient documentation

## 2022-06-06 DIAGNOSIS — D472 Monoclonal gammopathy: Secondary | ICD-10-CM | POA: Insufficient documentation

## 2022-06-06 DIAGNOSIS — Z811 Family history of alcohol abuse and dependence: Secondary | ICD-10-CM | POA: Insufficient documentation

## 2022-06-06 DIAGNOSIS — Z881 Allergy status to other antibiotic agents status: Secondary | ICD-10-CM | POA: Diagnosis not present

## 2022-06-06 DIAGNOSIS — Z825 Family history of asthma and other chronic lower respiratory diseases: Secondary | ICD-10-CM | POA: Diagnosis not present

## 2022-06-06 DIAGNOSIS — Z803 Family history of malignant neoplasm of breast: Secondary | ICD-10-CM | POA: Diagnosis not present

## 2022-06-06 DIAGNOSIS — Z832 Family history of diseases of the blood and blood-forming organs and certain disorders involving the immune mechanism: Secondary | ICD-10-CM | POA: Insufficient documentation

## 2022-06-06 DIAGNOSIS — R519 Headache, unspecified: Secondary | ICD-10-CM | POA: Diagnosis not present

## 2022-06-06 DIAGNOSIS — Z833 Family history of diabetes mellitus: Secondary | ICD-10-CM | POA: Diagnosis not present

## 2022-06-06 DIAGNOSIS — Z79624 Long term (current) use of inhibitors of nucleotide synthesis: Secondary | ICD-10-CM | POA: Insufficient documentation

## 2022-06-07 ENCOUNTER — Other Ambulatory Visit: Payer: Self-pay

## 2022-06-07 ENCOUNTER — Inpatient Hospital Stay: Payer: BC Managed Care – PPO | Attending: Hematology and Oncology

## 2022-06-07 ENCOUNTER — Other Ambulatory Visit: Payer: BC Managed Care – PPO

## 2022-06-07 ENCOUNTER — Inpatient Hospital Stay: Payer: BC Managed Care – PPO

## 2022-06-07 DIAGNOSIS — D472 Monoclonal gammopathy: Secondary | ICD-10-CM

## 2022-06-07 LAB — CBC WITH DIFFERENTIAL (CANCER CENTER ONLY)
Abs Immature Granulocytes: 0.02 10*3/uL (ref 0.00–0.07)
Basophils Absolute: 0.1 10*3/uL (ref 0.0–0.1)
Basophils Relative: 1 %
Eosinophils Absolute: 0.2 10*3/uL (ref 0.0–0.5)
Eosinophils Relative: 2 %
HCT: 41.8 % (ref 36.0–46.0)
Hemoglobin: 14.3 g/dL (ref 12.0–15.0)
Immature Granulocytes: 0 %
Lymphocytes Relative: 26 %
Lymphs Abs: 2.3 10*3/uL (ref 0.7–4.0)
MCH: 30.8 pg (ref 26.0–34.0)
MCHC: 34.2 g/dL (ref 30.0–36.0)
MCV: 89.9 fL (ref 80.0–100.0)
Monocytes Absolute: 0.8 10*3/uL (ref 0.1–1.0)
Monocytes Relative: 9 %
Neutro Abs: 5.5 10*3/uL (ref 1.7–7.7)
Neutrophils Relative %: 62 %
Platelet Count: 308 10*3/uL (ref 150–400)
RBC: 4.65 MIL/uL (ref 3.87–5.11)
RDW: 15 % (ref 11.5–15.5)
WBC Count: 8.8 10*3/uL (ref 4.0–10.5)
nRBC: 0 % (ref 0.0–0.2)

## 2022-06-07 LAB — COMPREHENSIVE METABOLIC PANEL
ALT: 21 U/L (ref 0–44)
AST: 21 U/L (ref 15–41)
Albumin: 4.5 g/dL (ref 3.5–5.0)
Alkaline Phosphatase: 50 U/L (ref 38–126)
Anion gap: 7 (ref 5–15)
BUN: 19 mg/dL (ref 6–20)
CO2: 24 mmol/L (ref 22–32)
Calcium: 9.9 mg/dL (ref 8.9–10.3)
Chloride: 108 mmol/L (ref 98–111)
Creatinine, Ser: 0.97 mg/dL (ref 0.44–1.00)
GFR, Estimated: >60 mL/min (ref >60)
Glucose, Bld: 95 mg/dL (ref 70–99)
Potassium: 4.2 mmol/L (ref 3.5–5.1)
Sodium: 139 mmol/L (ref 135–145)
Total Bilirubin: 0.4 mg/dL (ref 0.3–1.2)
Total Protein: 8.3 g/dL — ABNORMAL HIGH (ref 6.5–8.1)

## 2022-06-07 LAB — FERRITIN: Ferritin: 49 ng/mL (ref 11–307)

## 2022-06-08 ENCOUNTER — Telehealth: Payer: Self-pay | Admitting: Anesthesiology

## 2022-06-08 LAB — KAPPA/LAMBDA LIGHT CHAINS
Kappa free light chain: 54.7 mg/L — ABNORMAL HIGH (ref 3.3–19.4)
Kappa, lambda light chain ratio: 5.42 — ABNORMAL HIGH (ref 0.26–1.65)
Lambda free light chains: 10.1 mg/L (ref 5.7–26.3)

## 2022-06-08 NOTE — Telephone Encounter (Signed)
Pt called stating she needs a refill on her medication  1. Which medications need refilled? Ubrelvy 100 mg  2. Which pharmacy/location is medication to be sent to? [facility] on [address], [place]   3. Do they need a 30 day or 90 day supply? 30 day supply

## 2022-06-09 ENCOUNTER — Other Ambulatory Visit: Payer: Self-pay | Admitting: *Deleted

## 2022-06-09 DIAGNOSIS — D472 Monoclonal gammopathy: Secondary | ICD-10-CM

## 2022-06-09 MED ORDER — UBRELVY 100 MG PO TABS: 1.0000 | ORAL_TABLET | ORAL | 2 refills | Status: DC | PRN

## 2022-06-14 LAB — MULTIPLE MYELOMA PANEL, SERUM
Albumin SerPl Elph-Mcnc: 3.8 g/dL (ref 2.9–4.4)
Albumin/Glob SerPl: 1 (ref 0.7–1.7)
Alpha 1: 0.2 g/dL (ref 0.0–0.4)
Alpha2 Glob SerPl Elph-Mcnc: 0.8 g/dL (ref 0.4–1.0)
B-Globulin SerPl Elph-Mcnc: 0.8 g/dL (ref 0.7–1.3)
Gamma Glob SerPl Elph-Mcnc: 2.2 g/dL — ABNORMAL HIGH (ref 0.4–1.8)
Globulin, Total: 4 g/dL — ABNORMAL HIGH (ref 2.2–3.9)
IgA: 47 mg/dL — ABNORMAL LOW (ref 87–352)
IgG (Immunoglobin G), Serum: 2293 mg/dL — ABNORMAL HIGH (ref 586–1602)
IgM (Immunoglobulin M), Srm: 85 mg/dL (ref 26–217)
M Protein SerPl Elph-Mcnc: 1.7 g/dL — ABNORMAL HIGH
Total Protein ELP: 7.8 g/dL (ref 6.0–8.5)

## 2022-06-15 ENCOUNTER — Other Ambulatory Visit: Payer: Self-pay

## 2022-06-15 ENCOUNTER — Inpatient Hospital Stay (HOSPITAL_BASED_OUTPATIENT_CLINIC_OR_DEPARTMENT_OTHER): Payer: BC Managed Care – PPO | Admitting: Hematology and Oncology

## 2022-06-15 VITALS — HR 71 | Temp 97.8°F | Resp 17 | Wt 189.3 lb

## 2022-06-15 DIAGNOSIS — D472 Monoclonal gammopathy: Secondary | ICD-10-CM | POA: Diagnosis not present

## 2022-06-15 LAB — UPEP/UIFE/LIGHT CHAINS/TP, 24-HR UR
% BETA, Urine: 0 %
ALPHA 1 URINE: 0 %
Albumin, U: 100 %
Alpha 2, Urine: 0 %
Free Kappa Lt Chains,Ur: 9.33 mg/L (ref 1.17–86.46)
Free Kappa/Lambda Ratio: >13.52 (ref 1.83–14.26)
Free Lambda Lt Chains,Ur: <0.69 mg/L (ref 0.27–15.21)
GAMMA GLOBULIN URINE: 0 %
Total Protein, Urine-Ur/day: 273 mg/24 hr — ABNORMAL HIGH (ref 30–150)
Total Protein, Urine: 9.1 mg/dL

## 2022-06-15 NOTE — Progress Notes (Signed)
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 Leonides Schanz. Ferritin 75.   # IgG Kappa MGUS 06/30/2021: establish care with Dr. Leonides Schanz  06/30/2021: Kappa 55.7, Lambda 8.6, Ratio 6.48. 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   Interval History:  Gwendolyn Ramos 57 y.o. female with medical history significant for MGUS and hereditary hemochromatosis who presents for a follow up visit. The patient's last visit was on 12/15/2021. In the interim since the last visit she has had no major changes in her health.  On exam today Gwendolyn Ramos notes she has been well overall interim since her last visit.  She underwent a breast reduction and tummy tuck.  She reports that since that time her migraines and headaches have dissipated.  She reports that she did have a little infection from the tummy tuck but that has improved.  She is also started on Ozempic which unfortunately not covered by her insurance because she is not diabetic.  She also was started on Crestor by her cardiologist.  She reports her energy is good and overall she feels strong.  She is not having any other deviation from her baseline health..  She denies any fevers, chills, sweats, nausea, vomiting or diarrhea.  A full 10 point ROS is listed below.  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 10/21/20    SURGICAL HISTORY: Past Surgical History:  Procedure Laterality Date   BLADDER SUSPENSION N/A 10/26/2020   Procedure: TRANSVAGINAL TAPE (TVT) PROCEDURE;  Surgeon: Marguerita Beards, MD;  Location: [facility];  Service: Gynecology;  Laterality: N/A;   BREAST REDUCTION SURGERY Bilateral 01/13/2022   wtih abdominoplasty   BUNIONECTOMY Bilateral    CHOLECYSTECTOMY  2001   CYSTOSCOPY N/A 10/26/2020   Procedure: CYSTOSCOPY;  Surgeon: Marguerita Beards, MD;  Location: [facility];  Service: Gynecology;  Laterality: N/A;   NASAL SEPTUM SURGERY     RECTOCELE REPAIR N/A 10/26/2020   Procedure: POSTERIOR REPAIR (RECTOCELE) with perineorrhaphy;  Surgeon: Marguerita Beards, MD;  Location: [facility];  Service: Gynecology;  Laterality: N/A;  total time requested for all procedures is 1.5 hours   REDUCTION MAMMAPLASTY     TONSILLECTOMY      SOCIAL HISTORY: Social History   Socioeconomic History   Marital status: Married  Spouse name: Not on file   Number of children: 2   Years of education: Not on file   Highest education level: Not on file  Occupational History   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  Tobacco Use   Smoking status: Never   Smokeless tobacco: Never  Vaping Use   Vaping Use: Never used  Substance and Sexual Activity   Alcohol use: Yes    Alcohol/week: 3.0 standard drinks of alcohol    Types: 3 Glasses of wine per week   Drug use:  Never   Sexual activity: Not Currently    Birth control/protection: None  Other Topics Concern   Not on file  Social History Narrative   Right handed    Two story home   Drinks caffeine   Social Determinants of Health   Financial Resource Strain: Not on file  Food Insecurity: Not on file  Transportation Needs: Not on file  Physical Activity: Not on file  Stress: Not on file  Social Connections: Not on file  Intimate Partner Violence: Not on file    FAMILY HISTORY: Family History  Problem Relation Age of Onset   Diabetes Mother    COPD Mother    Congestive Heart Failure Mother    Thyroid disease Mother    Hemochromatosis Father    Alcoholism Father    Breast cancer Sister 78       invasive lobular breast cancer, negative genetic testing   Diabetes Sister    Asthma Sister    Asthma Brother    Diabetes Maternal Aunt    Breast cancer Maternal Aunt    Heart attack Maternal Grandfather    Diabetes Other     ALLERGIES:  is allergic to levaquin [levofloxacin], methylchloroisothiazolinone [methylisothiazolinone], and keflex [cephalexin].  MEDICATIONS:  Current Outpatient Medications  Medication Sig Dispense Refill   betamethasone dipropionate 0.05 % cream Apply topically 2 (two) times daily.     buPROPion (WELLBUTRIN SR) 150 MG 12 hr tablet Take 150 mg by mouth 2 (two) times daily.     estradiol (ESTRACE) 0.1 MG/GM vaginal cream 1 gram pv twice weekly 42.5 g 3   metoprolol tartrate (LOPRESSOR) 100 MG tablet Take 1 tablet by mouth once for procedure. 1 tablet 0   Multiple Vitamins-Minerals (MULTIVITAMIN WITH MINERALS) tablet Take 1 tablet by mouth daily.     NON FORMULARY Calories 5     NON FORMULARY On guard     Omega-3 Fatty Acids (FISH OIL) 1000 MG CAPS Take by mouth.     omeprazole (PRILOSEC) 20 MG capsule Take 20 mg by mouth daily.     ondansetron (ZOFRAN) 4 MG tablet Take 1 tablet (4 mg total) by mouth every 8 (eight) hours as needed for nausea or vomiting. 20  tablet 0   OVER THE COUNTER MEDICATION Vegan micro VM -Doterra     OVER THE COUNTER MEDICATION Iron 65 mg-Take 1 table by mouth every other day.     OVER THE COUNTER MEDICATION Magnesium 325mg -Take 2 capsule by mouth daily.     OZEMPIC, 0.25 OR 0.5 MG/DOSE, 2 MG/3ML SOPN SMARTSIG:0.25 Milligram(s) SUB-Q Once a Week     rosuvastatin (CRESTOR) 10 MG tablet Take 1 tablet (10 mg total) by mouth daily. 90 tablet 3   sulfamethoxazole-trimethoprim (BACTRIM DS) 800-160 MG tablet Take 1 tablet by mouth 2 (two) times daily.     TART CHERRY PO Take 1 tablet by mouth daily.     topiramate (TOPAMAX) 100 MG tablet Take 1 tablet (100  mg total) by mouth at bedtime. 30 tablet 11   Ubrogepant (UBRELVY) 100 MG TABS Take 1 tablet (100 mg total) by mouth as needed (May repeat in 2 hours if needed.  Maximum 2 tablets in 24 hours.). 10 tablet 2   valACYclovir (VALTREX) 500 MG tablet Take 1 tablet (500 mg total) by mouth daily. 90 tablet 4   No current facility-administered medications for this visit.    REVIEW OF SYSTEMS:   Constitutional: ( - ) fevers, ( - )  chills , ( - ) night sweats Eyes: ( - ) blurriness of vision, ( - ) double vision, ( - ) watery eyes Ears, nose, mouth, throat, and face: ( - ) mucositis, ( - ) sore throat Respiratory: ( - ) cough, ( - ) dyspnea, ( - ) wheezes Cardiovascular: ( - ) palpitation, ( - ) chest discomfort, ( - ) lower extremity swelling Gastrointestinal:  ( - ) nausea, ( - ) heartburn, ( - ) change in bowel habits Skin: ( - ) abnormal skin rashes Lymphatics: ( - ) new lymphadenopathy, ( - ) easy bruising Neurological: ( - ) numbness, ( - ) tingling, ( - ) new weaknesses Behavioral/Psych: ( - ) mood change, ( - ) new changes  All other systems were reviewed with the patient and are negative.  PHYSICAL EXAMINATION:  There were no vitals filed for this visit.   There were no vitals filed for this visit.    GENERAL: Well-appearing middle-age Caucasian female, alert, no  distress and comfortable SKIN: skin color, texture, turgor are normal, no rashes or significant lesions EYES: conjunctiva are pink and non-injected, sclera clear LUNGS: clear to auscultation and percussion with normal breathing effort HEART: regular rate & rhythm and no murmurs and no lower extremity edema Musculoskeletal: no cyanosis of digits and no clubbing  PSYCH: alert & oriented x 3, fluent speech NEURO: no focal motor/sensory deficits  LABORATORY DATA:  I have reviewed the data as listed    Latest Ref Rng & Units 06/07/2022    8:11 AM 12/07/2021    9:27 AM 08/05/2021    7:43 AM  CBC  WBC 4.0 - 10.5 K/uL 8.8  9.3  9.0   Hemoglobin 12.0 - 15.0 g/dL 16.1  09.6  04.5   Hematocrit 36.0 - 46.0 % 41.8  41.0  40.5   Platelets 150 - 400 K/uL 308  331  286        Latest Ref Rng & Units 06/06/2022    8:13 AM 03/28/2022   10:30 AM 12/07/2021    9:27 AM  CMP  Glucose 70 - 99 mg/dL 95  85  82   BUN 6 - 20 mg/dL 19  10  21    Creatinine 0.44 - 1.00 mg/dL 4.09  8.11  9.14   Sodium 135 - 145 mmol/L 139  136  137   Potassium 3.5 - 5.1 mmol/L 4.2  4.7  4.4   Chloride 98 - 111 mmol/L 108  104  105   CO2 22 - 32 mmol/L 24  18  26    Calcium 8.9 - 10.3 mg/dL 9.9  78.2  95.6   Total Protein 6.5 - 8.1 g/dL 8.3   8.5   Total Bilirubin 0.3 - 1.2 mg/dL 0.4   0.4   Alkaline Phos 38 - 126 U/L 50   58   AST 15 - 41 U/L 21   19   ALT 0 - 44 U/L 21   21  Lab Results  Component Value Date   MPROTEIN 1.7 (H) 06/07/2022   MPROTEIN 1.3 (H) 12/07/2021   MPROTEIN 1.7 (H) 06/30/2021   Lab Results  Component Value Date   KPAFRELGTCHN 54.7 (H) 06/07/2022   KPAFRELGTCHN 43.4 (H) 12/07/2021   KPAFRELGTCHN 55.7 (H) 06/30/2021   LAMBDASER 10.1 06/07/2022   LAMBDASER 8.1 12/07/2021   LAMBDASER 8.6 06/30/2021   KAPLAMBRATIO 5.42 (H) 06/07/2022   KAPLAMBRATIO 5.36 (H) 12/07/2021   KAPLAMBRATIO 6.48 (H) 06/30/2021    RADIOGRAPHIC STUDIES: MM Digital Diagnostic Unilat L  Result Date:  05/24/2022 CLINICAL DATA:  Recall from screening for probably benign left breast calcifications. Patient underwent bilateral breast reduction surgery between her 2023 screening mammogram and her current exam. EXAM: DIGITAL DIAGNOSTIC UNILATERAL LEFT MAMMOGRAM WITH CAD TECHNIQUE: Left digital diagnostic mammography was performed. COMPARISON:  Previous exam(s). ACR Breast Density Category b: There are scattered areas of fibroglandular density. FINDINGS: There are relatively faint, punctate calcifications that are loosely grouped in the lateral left breast, predominantly anterior, spanning approximately 3.7 cm in greatest dimension. There is no associated mass or distortion and no linearity or branching. IMPRESSION: 1. Probably benign left breast calcifications. Short-term follow-up recommended. RECOMMENDATION: Diagnostic left breast mammography with magnification views in 6 months to reassess the probably benign calcifications. I have discussed the findings and recommendations with the patient. If applicable, a reminder letter will be sent to the patient regarding the next appointment. BI-RADS CATEGORY  3: Probably benign. Electronically Signed   By: Amie Portland M.D.   On: 05/24/2022 16:18   ASSESSMENT & PLAN Gwendolyn Ramos [facility] 57 y.o. female with medical history significant for MGUS and hereditary hemochromatosis who presents for a follow up visit.  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  Patient is never required phlebotomy --Compound heterozygous rarely develop overload of iron. --ferritin 49 on 06/07/2022 --Continue to monitor  # IgG Kappa MGUS --Labs showed white blood cell count 8.8, hemoglobin 14.3, MCV 89.9, and platelets of 308 with M protein stable at 1.7. --recommend a metastatic bone survey yearly to assess for lytic lesions. UPEP ordered last week, results pending.  --bone marrow biopsy performed on 08/05/2021  showed 9% plasma cells by manual count, consistent with MGUS.  --Return to clinic in 6 months time or sooner if there are any concerning abnormalities   No orders of the defined types were placed in this encounter.   All questions were answered. The patient knows to call the clinic with any problems, questions or concerns.  A total of more than 30 minutes were spent on this encounter with face-to-face time and non-face-to-face time, including preparing to see the patient, ordering tests and/or medications, counseling the patient and coordination of care as outlined above.   Ulysees Barns, MD Department of Hematology/Oncology [facility] Phone: [phone] Pager: [phone] Email: [email or web address]  06/15/2022 7:27 AM

## 2022-06-16 NOTE — Addendum Note (Signed)
Addended by: Mertha Finders A on: 06/16/2022 03:54 PM   Modules accepted: Orders

## 2022-06-22 ENCOUNTER — Ambulatory Visit (HOSPITAL_COMMUNITY)
Admission: RE | Admit: 2022-06-22 | Discharge: 2022-06-22 | Disposition: A | Discharge status: Home / Self Care | Payer: BC Managed Care – PPO | Source: Ambulatory Visit | Attending: Hematology and Oncology | Admitting: Hematology and Oncology

## 2022-06-22 DIAGNOSIS — D472 Monoclonal gammopathy: Secondary | ICD-10-CM | POA: Diagnosis present

## 2022-06-27 ENCOUNTER — Telehealth: Payer: Self-pay | Admitting: *Deleted

## 2022-06-27 NOTE — Telephone Encounter (Signed)
TCT patient regarding recent bone survey,  Spoke with her. Advised that her bone survey X-ray showed no concerning abnormalities. We will see her back in Nov 2024. Pt voiced understanding.

## 2022-06-27 NOTE — Telephone Encounter (Signed)
-----   Message from Jaci Standard, MD sent at 06/27/2022 10:19 AM EDT ----- Please let Mrs. Swinger know that her bone survey X-ray showed no concerning abnormalities. We will see her back in Nov 2024.   ----- Message ----- From: Leory Plowman, Rad Results In Sent: 06/26/2022  11:45 AM EDT To: Jaci Standard, MD

## 2022-06-30 NOTE — Progress Notes (Unsigned)
[facility] [address] [place] [address] Phone: [phone] Subjective:   Gwendolyn Ramos, am serving as a scribe for Dr. Antoine Ramos.  I'm seeing this patient by the request  of:  Pahwani, Rinka R, MD  CC: Back and neck pain follow-up  [id number]  Gwendolyn Ramos is a 57 y.o. female coming in with complaint of back and neck pain. OMT 04/12/2022. Patient states that she is having some soreness in neck.   Patient started doing exercise class and her lower back is now bothering her. Painful to raise legs off floor from supine position with knees extended.   Medications patient has been prescribed: None  Taking:         Reviewed prior external information including notes and imaging from previsou exam, outside providers and external EMR if available.   As well as notes that were available from care everywhere and other healthcare systems.  Past medical history, social, surgical and family history all reviewed in electronic medical record.  No pertanent information unless stated regarding to the chief complaint.   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 10/21/20    Allergies  Allergen Reactions   Levaquin [Levofloxacin]     High fever, elevated heart rate    Methylchloroisothiazolinone [Methylisothiazolinone]     In liquid soaps causes skin irritation.   Keflex [Cephalexin] Rash    Caused possible rash of arms, HA     Review of Systems:  No visual changes, nausea, vomiting, diarrhea, constipation, dizziness, abdominal pain, skin rash, fevers, chills, night sweats, weight loss, swollen lymph nodes, body aches, joint swelling, chest pain, shortness of breath, mood changes. POSITIVE muscle aches, headache  Objective  Blood pressure 108/74, pulse 81, height 5\' 9"  (1.753 m), weight 187 lb (84.8 kg), SpO2 98 %.   General: No apparent distress alert and oriented x3 mood and affect normal, dressed appropriately.  HEENT: Pupils equal, extraocular movements intact  Respiratory: Patient's speak in full sentences and does not appear short of breath  Cardiovascular: No lower extremity edema, non tender, no erythema  Back exam does have loss lordosis noted.  Neck exam does have significant difficulty sidebending  Osteopathic findings  C3 flexed rotated and side bent right C5 flexed rotated and side bent right C7 flexed rotated and side bent left T3 extended rotated and side bent right inhaled rib T6 extended rotated and side bent left L2 flexed rotated and side bent right Sacrum right on right       Assessment and Plan:  Cervicogenic headache Patient has had worsening cervicogenic headaches again.  Patient did discontinue the Topamax for some time and did notice that this made it worse.  Discussed  with patient about icing regimen and home exercises, discussed which activities to do and which.  Avoid increase activity slowly otherwise.  Follow-up with me again in 6 to 8 weeks    Nonallopathic problems  Decision today to treat with OMT was based on Physical Exam  After verbal consent patient was treated with HVLA, ME, FPR techniques in cervical, rib, thoracic, lumbar, and sacral  areas  Patient tolerated the procedure well with improvement  in symptoms  Patient given exercises, stretches and lifestyle modifications  See medications in patient instructions if given  Patient will follow up in 4-8 weeks     The above documentation has been reviewed and is accurate and complete Gwendolyn Saa, DO         Note: This dictation was prepared with Dragon dictation along with smaller phrase technology. Any transcriptional errors that result from this process are unintentional.

## 2022-07-05 ENCOUNTER — Ambulatory Visit (INDEPENDENT_AMBULATORY_CARE_PROVIDER_SITE_OTHER): Payer: BC Managed Care – PPO | Admitting: Family Medicine

## 2022-07-05 ENCOUNTER — Encounter: Payer: Self-pay | Admitting: Family Medicine

## 2022-07-05 VITALS — BP 108/74 | HR 81 | Ht 69.0 in | Wt 187.0 lb

## 2022-07-05 DIAGNOSIS — G4486 Cervicogenic headache: Secondary | ICD-10-CM | POA: Diagnosis not present

## 2022-07-05 DIAGNOSIS — M9903 Segmental and somatic dysfunction of lumbar region: Secondary | ICD-10-CM | POA: Diagnosis not present

## 2022-07-05 DIAGNOSIS — M9904 Segmental and somatic dysfunction of sacral region: Secondary | ICD-10-CM | POA: Diagnosis not present

## 2022-07-05 DIAGNOSIS — M9901 Segmental and somatic dysfunction of cervical region: Secondary | ICD-10-CM | POA: Diagnosis not present

## 2022-07-05 DIAGNOSIS — M9902 Segmental and somatic dysfunction of thoracic region: Secondary | ICD-10-CM

## 2022-07-05 DIAGNOSIS — M9908 Segmental and somatic dysfunction of rib cage: Secondary | ICD-10-CM

## 2022-07-05 NOTE — Assessment & Plan Note (Signed)
Patient has had worsening cervicogenic headaches again.  Patient did discontinue the Topamax for some time and did notice that this made it worse.  Discussed with patient about icing regimen and home exercises, discussed which activities to do and which.  Avoid increase activity slowly otherwise.  Follow-up with me again in 6 to 8 weeks

## 2022-07-06 ENCOUNTER — Other Ambulatory Visit (HOSPITAL_COMMUNITY): Payer: Self-pay

## 2022-07-12 NOTE — Progress Notes (Unsigned)
NEUROLOGY FOLLOW UP OFFICE NOTE  Gwendolyn Ramos [id number]  Assessment/Plan:   Assessment/Plan:    Migraine without aura, without status migrainosus, not intractable Dizziness/lightheadedness.  Orthostatics negative.    1.  Migraine prevention: Due to side effects, plan to transition from topiramate to Aimovig 140mg .  She will decrease topiramate to 50mg  at bedtime.  Once she starts Aimovig, she will discontinue topiramate. 2.  Migraine rescue:  Ubrelvy100mg   3.  Limit use of pain relievers to no more than 2 days out of week to prevent risk of rebound or medication-overuse headache. 4.  Keep headache diary 5.  Follow up with PCP regarding dizziness 6.  Follow up with me in 6 months.   Subjective:  Gwendolyn Ramos is a 57 year old female with MGUS who follows up for migraines.   UPDATE: Breast reduction - much less headaches Trouble remembering names - quit taking topiramate and within 4 days started getting daily headaches and restarted. Brief twinge daily but no longer daily headache.   Dizzy - stand up to hold the wall 1 since surgery 6 months ago.  2 Dull headache-   She had breast reduction about 6 months ago.  Headaches have much improved.  She noticed memory difficulty, trouble remembering names.  Thinking it was secondary to the topiramate, she stopped it but started having increased headaches, so she restarted it after 4 days.  Dull headaches are again infrequent and she has had only about 2 migraines in past 6 months.  Quickly aborts with Ubrelvy.  More recently, she reports dizziness, described as a lightheaded sensation.  It occurs when she stands up.  She keeps hydrated.  No change in medications.    Current analgesics/NSAIDs or triptans Current NSAIDS:  none Current analgesics:  none Current triptans:  none Current ergotamine:  none Current anti-emetic:  none Current muscle relaxants:  none Current anti-anxiolytic:  none Current sleep aide:   none Current Antihypertensive medications:  none Current Antidepressant medications:  Wellbutrin Current Anticonvulsant medications:  topiramate 75mg  at bedtime Current anti-CGRP:  Ubrelvy 100mg  Other therapy:  Aroma therapy.  She does receive Botox for cosmetic reasons.   Caffeine:  1 to 2 cups of coffee daily Diet:  At least 64 oz water daily. Does not skip meals Exercise:  Routine.  Walk, golf, tennis Depression:  Yes but stable; Anxiety:  no Other pain:  neck pain improved with Dr. Katrinka Blazing.  Still with some back pain.  Scheduled to undergo breast reduction in December.   Sleep hygiene:  good     HISTORY:  Onset:  High school and college Location:  1.  Diffuse; 2.  Unilateral either side; 3.  Bilateral retro-orbital.  Sometimes associated neck pain. Quality:  1.  Pressure; 2.  Throbbing; 3.  Stabbing Initial Intensity:  1.  Mild-moderate; 2.  Severe; 3.  Severe.  She denies new headache, thunderclap headache or severe headache that wakes her from sleep. Aura:  no Associated symptoms:  Photophobia, phonophobia, osmophobia, dizziness, sometimes blurred vision.  No recent nausea or vomiting.  She denies associated unilateral numbness or weakness. Initial Duration:  Hour to all day Initial Frequency:  Daily headache (severe headaches occur 1 to 2 times a month) Frequency of abortive medication: She pre-treats with Aleve to prevent progression into migraine.  Takes Aleve 4 to 5 days a week. Triggers:  Valsalva, hitting head, menopause; neck pain; perfumes or gasoline, hunger, sugar/sweets Relieving factors:  Aroma therapy; hot shower Activity:  Aggravates.   On  04/17/2021, she had a new type of headache.  She describes a 7/10 searing paroxysmal pain on left greater than right side of head.  She had associated persistent left sided facial numbness as well.  She went to the ED where brain imaging was suggested but she decided to leave.  It lasted an entire day.  She always has a dull head  pressure but she had no headache at all for 3 days.  To evaluate new paroxysmal headache and left facial numbness, she had an MRI/MRA of head on 05/19/2021 was unremarkable.      Past NSAIDS:  Advil, Aleve (upsets stomach) Past analgesics:  Tylenol with codeine; Tylenol Past abortive triptans:  Imitrex, Maxalt Past abortive ergotamine:  none Past muscle relaxants:  Soma; Flexeril Past anti-emetic:  none Past antihypertensive medications:  metoprolol Past antidepressant medications:  none Past anticonvulsant medications:  none Past anti-CGRP:  none Past vitamins/Herbal/Supplements:  none Past antihistamines/decongestants:  none Other past therapies:  none     Family history of headache:  Mother (migraines); daughter (migraines)  PAST MEDICAL HISTORY: Past Medical History:  Diagnosis Date   Anxiety    COVID-19 08/2020   mild symptoms, no treatment, all symptoms resolved as of 10/21/20 per patient   Depression    Eczema of hand    both hands   GERD (gastroesophageal reflux disease)    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 10/21/20    MEDICATIONS: Current Outpatient Medications on File Prior to Visit  Medication Sig Dispense Refill   betamethasone dipropionate 0.05 % cream Apply topically 2 (two) times daily.     buPROPion (WELLBUTRIN SR) 150 MG 12 hr tablet Take 150 mg by mouth 2 (two) times daily.     estradiol (ESTRACE) 0.1 MG/GM vaginal cream 1 gram pv twice weekly 42.5 g 3   metoprolol tartrate (LOPRESSOR) 100 MG tablet Take 1 tablet by mouth once for procedure. 1  tablet 0   Multiple Vitamins-Minerals (MULTIVITAMIN WITH MINERALS) tablet Take 1 tablet by mouth daily.     NON FORMULARY Calories 5     NON FORMULARY On guard     Omega-3 Fatty Acids (FISH OIL) 1000 MG CAPS Take by mouth.     omeprazole (PRILOSEC) 20 MG capsule Take 20 mg by mouth daily.     OVER THE COUNTER MEDICATION Vegan micro VM -Doterra     OVER THE COUNTER MEDICATION Magnesium 325mg -Take 2 capsule by mouth daily.     OZEMPIC, 0.25 OR 0.5 MG/DOSE, 2 MG/3ML SOPN SMARTSIG:0.25 Milligram(s) SUB-Q Once a Week     rosuvastatin (CRESTOR) 10 MG tablet Take 1 tablet (10 mg total) by mouth daily. 90 tablet 3   TART CHERRY PO Take 1 tablet by mouth daily.     topiramate (TOPAMAX) 100 MG tablet Take 1 tablet (100 mg total) by mouth at bedtime. 30 tablet 11   Ubrogepant (UBRELVY) 100 MG TABS Take 1 tablet (100 mg total) by mouth as needed (May repeat in 2 hours if needed.  Maximum 2 tablets in 24 hours.). 10 tablet 2   valACYclovir (VALTREX) 500 MG tablet Take 1 tablet (500 mg total) by mouth daily. 90 tablet 4   No current facility-administered medications on file prior to visit.    ALLERGIES: Allergies  Allergen Reactions   Levaquin [Levofloxacin]     High fever, elevated heart rate   Methylchloroisothiazolinone [Methylisothiazolinone]     In liquid soaps causes skin irritation.   Keflex [Cephalexin] Rash    Caused possible rash of arms, HA    FAMILY HISTORY: Family History  Problem Relation Age of Onset   Diabetes Mother    COPD Mother    Congestive Heart Failure Mother    Thyroid disease Mother    Hemochromatosis Father    Alcoholism Father    Breast cancer Sister 58       invasive lobular breast cancer, negative genetic testing   Diabetes Sister    Asthma Sister    Asthma Brother    Diabetes Maternal Aunt    Breast cancer Maternal Aunt    Heart attack Maternal Grandfather    Diabetes Other       Objective:  BP 114/78; Pulse 85, height 5\' 8"  (1.727 m), weight 184 lb  (83.5 kg), SpO2 98 %. General: No acute distress.  Patient appears well-groomed.   Head:  Normocephalic/atraumatic Eyes:  Fundi examined but not visualized Neck: supple, no paraspinal tenderness, full range of motion Heart:  Regular rate and rhythm Lungs:  Clear to auscultation bilaterally Back: No paraspinal tenderness Neurological Exam: alert and oriented.  Speech fluent and not dysarthric, language intact.  CN II-XII intact. Bulk and tone normal, muscle strength 5/5 throughout.  Sensation to light touch intact.  Deep tendon reflexes 2+ throughout.  Finger to nose testing intact.  Gait normal, Romberg negative.   Shon Millet, DO  CC: Ardean Larsen, MD

## 2022-07-13 ENCOUNTER — Encounter: Payer: Self-pay | Admitting: Neurology

## 2022-07-13 ENCOUNTER — Ambulatory Visit (INDEPENDENT_AMBULATORY_CARE_PROVIDER_SITE_OTHER): Payer: BC Managed Care – PPO | Admitting: Neurology

## 2022-07-13 VITALS — HR 85 | Ht 68.0 in | Wt 184.0 lb

## 2022-07-13 DIAGNOSIS — R42 Dizziness and giddiness: Secondary | ICD-10-CM | POA: Diagnosis not present

## 2022-07-13 DIAGNOSIS — G43009 Migraine without aura, not intractable, without status migrainosus: Secondary | ICD-10-CM | POA: Diagnosis not present

## 2022-07-13 MED ORDER — AIMOVIG 140 MG/ML ~~LOC~~ SOAJ: 140.0000 mg | SUBCUTANEOUS | 11 refills | Status: DC

## 2022-07-13 MED ORDER — UBRELVY 100 MG PO TABS: 1.0000 | ORAL_TABLET | ORAL | 11 refills | Status: DC | PRN

## 2022-07-13 NOTE — Patient Instructions (Signed)
Plan to start Aimovig injection every 28 days.  In meantime, cut topiramate in half to 50mg  at bedtime. Once you start the aimovig, then stop topiramate. Ubrelvy as needed. Limit use of pain relievers to no more than 2 days out of week to prevent risk of rebound or medication-overuse headache. Follow up with PCP regarding dizziness Follow up with me in 6 months.

## 2022-07-14 ENCOUNTER — Telehealth: Payer: Self-pay

## 2022-07-14 NOTE — Telephone Encounter (Signed)
Pa needed for Aimovig 140 mg.

## 2022-07-20 ENCOUNTER — Other Ambulatory Visit (HOSPITAL_COMMUNITY): Payer: Self-pay

## 2022-07-20 ENCOUNTER — Telehealth: Payer: Self-pay | Admitting: Pharmacy Technician

## 2022-07-20 NOTE — Telephone Encounter (Signed)
Status of PA in separate encounter 

## 2022-07-20 NOTE — Telephone Encounter (Signed)
Pharmacy Patient Advocate Encounter   Received notification from Pt Calls Messages that prior authorization for Aimovig 140MG /ML auto-injectors is required/requested.    [place] submitted to [employer] via CoverMyMeds Key/confirmation #/EOC [id number] Status is pending

## 2022-07-22 ENCOUNTER — Telehealth: Payer: Self-pay

## 2022-07-22 ENCOUNTER — Other Ambulatory Visit (HOSPITAL_COMMUNITY): Payer: Self-pay

## 2022-07-22 NOTE — Telephone Encounter (Signed)
Pharmacy Patient Advocate Encounter  Prior Authorization for Aimovig 140MG /ML auto-injectors has been APPROVED by [facility] from 07/20/2022 to 10/12/2022.   PA # PA Case ID #: [id number]  Spoke with Pharmacy to process. Copay is $51.43 for a 30DS per [facility] test claim.

## 2022-07-22 NOTE — Telephone Encounter (Signed)
Pharmacy Patient Advocate Encounter  Prior Authorization for Bernita Raisin 100MG  tablets has been APPROVED by [facility] from 07/06/2022 to 07/05/2023.   PA # PA Case ID #: [id number]

## 2022-07-22 NOTE — Telephone Encounter (Signed)
Patient advised of approval.  

## 2022-08-01 ENCOUNTER — Encounter: Payer: Self-pay | Admitting: Neurology

## 2022-08-08 ENCOUNTER — Encounter: Payer: Self-pay | Admitting: Neurology

## 2022-08-11 ENCOUNTER — Ambulatory Visit: Payer: BC Managed Care – PPO | Admitting: Neurology

## 2022-08-13 ENCOUNTER — Other Ambulatory Visit (HOSPITAL_COMMUNITY): Payer: Self-pay

## 2022-08-18 NOTE — Progress Notes (Signed)
[facility] [address] [place] [address] Phone: [phone] Subjective:    I'm seeing this patient by the request  of:  Pahwani, Rinka R, MD  CC: neck and back pain follow  up   [id number]  Gwendolyn Ramos is a 57 y.o. female coming in with complaint of back and neck pain. OMT 07/05/2022. Patient states that she is here for a check in, patient   Medications patient has been prescribed: None   Reviewed labs and they are stable for MGUS       Reviewed prior external information including notes and imaging from previsou exam, outside providers and external EMR if available.   As well as notes that were available from care everywhere and other healthcare systems.  Past medical history, social, surgical and family history all reviewed in electronic medical record.  No pertanent information unless stated regarding to the chief complaint.   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 10/21/20    Allergies  Allergen Reactions   Levaquin [Levofloxacin]     High fever, elevated heart rate   Methylchloroisothiazolinone [Methylisothiazolinone]     In liquid soaps causes skin irritation.   Keflex [Cephalexin] Rash    Caused possible rash of arms, HA     Review of Systems:  No  visual changes,  nausea, vomiting, diarrhea, constipation, dizziness, abdominal pain, skin rash, fevers, chills, night sweats, weight loss, swollen lymph nodes, body aches, joint swelling, chest pain, shortness of breath, mood changes. POSITIVE muscle aches, intermittent headache  Objective  Blood pressure 120/80, pulse 74, height 5\' 8"  (1.727 m), weight 191 lb (86.6 kg), SpO2 96%.   General: No apparent distress alert and oriented x3 mood and affect normal, dressed appropriately.  HEENT: Pupils equal, extraocular movements intact  Respiratory: Patient's speak in full sentences and does not appear short of breath  Cardiovascular: No lower extremity edema, non tender, no erythema  Neck exam does have tightness noted on the right side of the occipital region.  Patient has good sidebending noted.  Mild positive with FABER test also noted.  Osteopathic findings  C7 flexed rotated and side bent left T3 extended rotated and side bent right inhaled rib T7 extended rotated and side bent left L2 flexed rotated and side bent right Sacrum right on right       Assessment and Plan:  Cervicogenic headache Tightness noted, better witht the weight loss.  Discussed maivig  RTC in 3 months     Nonallopathic problems  Decision today to treat with OMT was based on Physical Exam  After verbal consent patient was treated with HVLA, ME, FPR techniques in cervical, rib, thoracic, lumbar, and sacral  areas  Patient tolerated the procedure well with improvement in symptoms  Patient given exercises, stretches and lifestyle modifications  See medications in patient instructions if given  Patient will follow up in 4-8 weeks     The above documentation has been reviewed and is accurate and complete Judi Saa, DO          Note: This dictation was prepared with Dragon dictation along with smaller phrase technology. Any transcriptional errors that result from this process are unintentional.

## 2022-08-22 ENCOUNTER — Ambulatory Visit (INDEPENDENT_AMBULATORY_CARE_PROVIDER_SITE_OTHER): Payer: BC Managed Care – PPO | Admitting: Family Medicine

## 2022-08-22 ENCOUNTER — Encounter: Payer: Self-pay | Admitting: Family Medicine

## 2022-08-22 VITALS — BP 120/80 | HR 74 | Ht 68.0 in | Wt 191.0 lb

## 2022-08-22 DIAGNOSIS — M9903 Segmental and somatic dysfunction of lumbar region: Secondary | ICD-10-CM

## 2022-08-22 DIAGNOSIS — M9908 Segmental and somatic dysfunction of rib cage: Secondary | ICD-10-CM

## 2022-08-22 DIAGNOSIS — G4486 Cervicogenic headache: Secondary | ICD-10-CM | POA: Diagnosis not present

## 2022-08-22 DIAGNOSIS — M9902 Segmental and somatic dysfunction of thoracic region: Secondary | ICD-10-CM | POA: Diagnosis not present

## 2022-08-22 DIAGNOSIS — M9904 Segmental and somatic dysfunction of sacral region: Secondary | ICD-10-CM | POA: Diagnosis not present

## 2022-08-22 DIAGNOSIS — M9901 Segmental and somatic dysfunction of cervical region: Secondary | ICD-10-CM

## 2022-08-22 NOTE — Patient Instructions (Signed)
Good to see you  So glad you are in a great place  3 month follow  up

## 2022-08-22 NOTE — Assessment & Plan Note (Signed)
Tightness noted, better witht the weight loss.  Discussed maivig  RTC in 3 months

## 2022-08-23 ENCOUNTER — Ambulatory Visit: Payer: BC Managed Care – PPO | Admitting: Neurology

## 2022-11-11 ENCOUNTER — Telehealth: Payer: Self-pay

## 2022-11-11 ENCOUNTER — Other Ambulatory Visit (HOSPITAL_COMMUNITY): Payer: Self-pay

## 2022-11-11 ENCOUNTER — Telehealth: Payer: Self-pay | Admitting: Pharmacy Technician

## 2022-11-11 NOTE — Telephone Encounter (Signed)
Pharmacy Patient Advocate Encounter   Received notification from Pt Calls Messages that prior authorization for AIMOVIG 140MG  is required/requested.   Insurance verification completed.   The patient is insured through [facility] .   Per test claim: PA required; PA submitted to [facility] via CoverMyMeds Key/confirmation #/EOC [id number] Status is pending

## 2022-11-11 NOTE — Telephone Encounter (Signed)
Patient states she was needing to pick up her amiovig 140 mg and was advised she needs a PA.   PA team please start PA. Patient due today.

## 2022-11-11 NOTE — Telephone Encounter (Signed)
Pharmacy Patient Advocate Encounter  Received notification from [facility] that Prior Authorization for AIMOVIG 140MG  has been APPROVED from 10.18.24 to 10.18.25. Ran test claim, Copay is $0. This test claim was processed through [facility]- copay amounts may vary at other pharmacies due to pharmacy/plan contracts, or as the patient moves through the different stages of their insurance plan.   PA #/Case ID/Reference #: [id number]

## 2022-11-17 NOTE — Progress Notes (Signed)
[name] [address] [place] [address] Phone: [phone] Subjective:    I'm seeing this patient by the request  of:  Pahwani, Rinka R, MD  CC: Headaches and allover pain follow-up  GEX:[id number]  Gwendolyn Ramos is a 57 y.o. female coming in with complaint of back and neck pain. OMT 08/22/2022. Patient states  Patient has also been having bilateral hand pain with weakness and patients is getting cramping in her legs really bad. Patient thinks it may be from the statin.   Medications patient has been prescribed: None  Taking:         Reviewed prior external information including notes and imaging from previsou exam, outside providers and external EMR if available.   As well as notes that were available from care everywhere and other healthcare systems.  Past medical history, social, surgical and family history all reviewed in electronic medical record.  No pertanent information unless stated regarding to the chief complaint.   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 10/21/20    Allergies  Allergen Reactions   Levaquin [Levofloxacin]     High fever, elevated heart rate   Methylchloroisothiazolinone [Methylisothiazolinone]     In liquid soaps causes skin irritation.   Keflex  [Cephalexin] Rash    Caused possible rash of arms, HA     Review of Systems:  No headache, visual changes, nausea, vomiting, diarrhea, constipation, dizziness, abdominal pain, skin rash, fevers, chills, night sweats, weight loss, swollen lymph nodes, body aches, joint swelling, chest pain, shortness of breath, mood changes. POSITIVE muscle aches  Objective  Blood pressure 118/80, pulse 76, height 5\' 8"  (1.727 m), weight 194 lb (88 kg), SpO2 99%.   General: No apparent distress alert and oriented x3 mood and affect normal, dressed appropriately.  HEENT: Pupils equal, extraocular movements intact  Respiratory: Patient's speak in full sentences and does not appear short of breath  Cardiovascular: No lower extremity edema, non tender, no erythema  Neck exam does have some loss lordosis noted.  Some tenderness to palpation in the paraspinal musculature.  Tightness noted in the trapezius areas bilaterally.  Osteopathic findings  C2 flexed rotated and side bent right C6 flexed rotated and side bent left T3 extended rotated and side bent right inhaled rib T9 extended rotated and side bent left L2 flexed rotated and side bent right Sacrum right on right     Assessment and Plan:  Cervicogenic headache Kidney continue to have headaches, continue to have tightness of the neck.  Concerned that patient may be having a flare appointment for underlying causes such as the hemochromatosis or the potential MGUS.  Patient is going to be following up with hematology oncology  in the near future.  Will get other labs.  Patient is on a statin and want to make sure that discussed icing regimen and home exercises, which activities to do and which ones to avoid.  Increase activity slowly over the course of next several weeks.  Follow-up with me again in 6 to 8 weeks    Nonallopathic problems  Decision today to treat with OMT was based on Physical Exam  After verbal consent patient was treated with HVLA,  ME, FPR techniques in cervical, rib, thoracic, lumbar, and sacral  areas  Patient tolerated the procedure well with improvement in symptoms  Patient given exercises, stretches and lifestyle modifications  See medications in patient instructions if given  Patient will follow up in 4-8 weeks     The above documentation has been reviewed and is accurate and complete Judi Saa, DO         Note: This dictation was prepared with Dragon dictation along with smaller phrase technology. Any transcriptional errors that result from this process are unintentional.

## 2022-11-22 ENCOUNTER — Encounter: Payer: Self-pay | Admitting: Family Medicine

## 2022-11-22 ENCOUNTER — Ambulatory Visit (INDEPENDENT_AMBULATORY_CARE_PROVIDER_SITE_OTHER): Payer: BC Managed Care – PPO | Admitting: Family Medicine

## 2022-11-22 VITALS — BP 118/80 | HR 76 | Ht 68.0 in | Wt 194.0 lb

## 2022-11-22 DIAGNOSIS — M9903 Segmental and somatic dysfunction of lumbar region: Secondary | ICD-10-CM | POA: Diagnosis not present

## 2022-11-22 DIAGNOSIS — M9908 Segmental and somatic dysfunction of rib cage: Secondary | ICD-10-CM

## 2022-11-22 DIAGNOSIS — M9904 Segmental and somatic dysfunction of sacral region: Secondary | ICD-10-CM

## 2022-11-22 DIAGNOSIS — M25541 Pain in joints of right hand: Secondary | ICD-10-CM | POA: Diagnosis not present

## 2022-11-22 DIAGNOSIS — M25542 Pain in joints of left hand: Secondary | ICD-10-CM

## 2022-11-22 DIAGNOSIS — M2550 Pain in unspecified joint: Secondary | ICD-10-CM

## 2022-11-22 DIAGNOSIS — M9901 Segmental and somatic dysfunction of cervical region: Secondary | ICD-10-CM

## 2022-11-22 DIAGNOSIS — M9902 Segmental and somatic dysfunction of thoracic region: Secondary | ICD-10-CM

## 2022-11-22 DIAGNOSIS — G4486 Cervicogenic headache: Secondary | ICD-10-CM

## 2022-11-22 LAB — CBC WITH DIFFERENTIAL/PLATELET
Basophils Absolute: 0.1 10*3/uL (ref 0.0–0.1)
Basophils Relative: 0.7 % (ref 0.0–3.0)
Eosinophils Absolute: 0.2 10*3/uL (ref 0.0–0.7)
Eosinophils Relative: 2.1 % (ref 0.0–5.0)
HCT: 42.8 % (ref 36.0–46.0)
Hemoglobin: 14.3 g/dL (ref 12.0–15.0)
Lymphocytes Relative: 31.8 % (ref 12.0–46.0)
Lymphs Abs: 2.8 10*3/uL (ref 0.7–4.0)
MCHC: 33.4 g/dL (ref 30.0–36.0)
MCV: 95.1 fL (ref 78.0–100.0)
Monocytes Absolute: 0.8 10*3/uL (ref 0.1–1.0)
Monocytes Relative: 8.9 % (ref 3.0–12.0)
Neutro Abs: 5 10*3/uL (ref 1.4–7.7)
Neutrophils Relative %: 56.5 % (ref 43.0–77.0)
Platelets: 324 10*3/uL (ref 150.0–400.0)
RBC: 4.5 Mil/uL (ref 3.87–5.11)
RDW: 13.8 % (ref 11.5–15.5)
WBC: 8.8 10*3/uL (ref 4.0–10.5)

## 2022-11-22 LAB — COMPREHENSIVE METABOLIC PANEL
ALT: 28 U/L (ref 0–35)
AST: 26 U/L (ref 0–37)
Albumin: 4.4 g/dL (ref 3.5–5.2)
Alkaline Phosphatase: 50 U/L (ref 39–117)
BUN: 13 mg/dL (ref 6–23)
CO2: 28 meq/L (ref 19–32)
Calcium: 10.4 mg/dL (ref 8.4–10.5)
Chloride: 102 meq/L (ref 96–112)
Creatinine, Ser: 0.97 mg/dL (ref 0.40–1.20)
GFR: 64.87 mL/min (ref >60.00)
Glucose, Bld: 95 mg/dL (ref 70–99)
Potassium: 4.6 meq/L (ref 3.5–5.1)
Sodium: 138 meq/L (ref 135–145)
Total Bilirubin: 0.4 mg/dL (ref 0.2–1.2)
Total Protein: 8.3 g/dL (ref 6.0–8.3)

## 2022-11-22 LAB — TSH: TSH: 1.38 u[IU]/mL (ref 0.35–5.50)

## 2022-11-22 LAB — T3, FREE: T3, Free: 2.9 pg/mL (ref 2.3–4.2)

## 2022-11-22 LAB — IBC PANEL
Iron: 96 ug/dL (ref 42–145)
Saturation Ratios: 34.3 % (ref 20.0–50.0)
TIBC: 280 ug/dL (ref 250.0–450.0)
Transferrin: 200 mg/dL — ABNORMAL LOW (ref 212.0–360.0)

## 2022-11-22 LAB — T4, FREE: Free T4: 0.84 ng/dL (ref 0.60–1.60)

## 2022-11-22 LAB — URIC ACID: Uric Acid, Serum: 4.3 mg/dL (ref 2.4–7.0)

## 2022-11-22 LAB — FERRITIN: Ferritin: 49.8 ng/mL (ref 10.0–291.0)

## 2022-11-22 LAB — CK: Total CK: 101 U/L (ref 7–177)

## 2022-11-22 LAB — SEDIMENTATION RATE: Sed Rate: 21 mm/h (ref 0–30)

## 2022-11-22 NOTE — Assessment & Plan Note (Addendum)
Chronic issue continue to have headaches, continue to have tightness of the neck.  Concerned that patient may be having a flare appointment for underlying causes such as the hemochromatosis or the potential MGUS.  Patient is going to be following up with hematology oncology in the near future.  Will get other labs.  Patient is on a statin and want to make sure that discussed icing regimen and home exercises, which activities to do and which ones to avoid.  Increase activity slowly over the course of next several weeks.  Follow-up with me again in 6 to 8 weeks

## 2022-11-22 NOTE — Telephone Encounter (Signed)
Done by Monchell on 10/18

## 2022-11-22 NOTE — Patient Instructions (Signed)
Good to see you  Labs on your way out  Can also consider a sleep study Follow up in 6 weeks

## 2022-11-26 LAB — PTH, INTACT AND CALCIUM
Calcium: 10.6 mg/dL — ABNORMAL HIGH (ref 8.6–10.4)
PTH: 24 pg/mL (ref 16–77)

## 2022-11-26 LAB — VITAMIN D 1,25 DIHYDROXY
Vitamin D 1, 25 (OH)2 Total: 50 pg/mL (ref 18–72)
Vitamin D2 1, 25 (OH)2: <8 pg/mL
Vitamin D3 1, 25 (OH)2: 50 pg/mL

## 2022-11-29 ENCOUNTER — Ambulatory Visit
Admission: RE | Admit: 2022-11-29 | Discharge: 2022-11-29 | Disposition: A | Discharge status: Home / Self Care | Payer: BC Managed Care – PPO | Source: Ambulatory Visit | Attending: Obstetrics & Gynecology | Admitting: Obstetrics & Gynecology

## 2022-11-29 ENCOUNTER — Other Ambulatory Visit: Payer: Self-pay | Admitting: Obstetrics & Gynecology

## 2022-11-29 DIAGNOSIS — R921 Mammographic calcification found on diagnostic imaging of breast: Secondary | ICD-10-CM

## 2022-12-02 ENCOUNTER — Telehealth: Payer: Self-pay | Admitting: Hematology and Oncology

## 2022-12-05 ENCOUNTER — Encounter: Payer: Self-pay | Admitting: Family Medicine

## 2022-12-05 ENCOUNTER — Encounter (HOSPITAL_BASED_OUTPATIENT_CLINIC_OR_DEPARTMENT_OTHER): Payer: Self-pay | Admitting: Obstetrics & Gynecology

## 2022-12-06 ENCOUNTER — Other Ambulatory Visit: Payer: Self-pay | Admitting: Hematology and Oncology

## 2022-12-06 ENCOUNTER — Inpatient Hospital Stay: Payer: BC Managed Care – PPO | Attending: Hematology and Oncology

## 2022-12-06 DIAGNOSIS — D472 Monoclonal gammopathy: Secondary | ICD-10-CM | POA: Diagnosis present

## 2022-12-06 DIAGNOSIS — R531 Weakness: Secondary | ICD-10-CM | POA: Insufficient documentation

## 2022-12-06 DIAGNOSIS — Z79624 Long term (current) use of inhibitors of nucleotide synthesis: Secondary | ICD-10-CM | POA: Insufficient documentation

## 2022-12-06 DIAGNOSIS — Z79899 Other long term (current) drug therapy: Secondary | ICD-10-CM | POA: Insufficient documentation

## 2022-12-06 DIAGNOSIS — R252 Cramp and spasm: Secondary | ICD-10-CM | POA: Insufficient documentation

## 2022-12-06 DIAGNOSIS — R9230 Dense breasts, unspecified: Secondary | ICD-10-CM | POA: Diagnosis not present

## 2022-12-06 DIAGNOSIS — Z881 Allergy status to other antibiotic agents status: Secondary | ICD-10-CM | POA: Insufficient documentation

## 2022-12-06 LAB — CMP (CANCER CENTER ONLY)
ALT: 28 U/L (ref 0–44)
AST: 23 U/L (ref 15–41)
Albumin: 4.5 g/dL (ref 3.5–5.0)
Alkaline Phosphatase: 53 U/L (ref 38–126)
Anion gap: 7 (ref 5–15)
BUN: 15 mg/dL (ref 6–20)
CO2: 28 mmol/L (ref 22–32)
Calcium: 10.4 mg/dL — ABNORMAL HIGH (ref 8.9–10.3)
Chloride: 104 mmol/L (ref 98–111)
Creatinine: 1 mg/dL (ref 0.44–1.00)
GFR, Estimated: >60 mL/min (ref >60)
Glucose, Bld: 92 mg/dL (ref 70–99)
Potassium: 4.1 mmol/L (ref 3.5–5.1)
Sodium: 139 mmol/L (ref 135–145)
Total Bilirubin: 0.5 mg/dL (ref <1.2)
Total Protein: 8.4 g/dL — ABNORMAL HIGH (ref 6.5–8.1)

## 2022-12-06 LAB — RETIC PANEL
Immature Retic Fract: 9.4 % (ref 2.3–15.9)
RBC.: 4.61 MIL/uL (ref 3.87–5.11)
Retic Count, Absolute: 70.5 10*3/uL (ref 19.0–186.0)
Retic Ct Pct: 1.5 % (ref 0.4–3.1)
Reticulocyte Hemoglobin: 34.9 pg (ref >27.9)

## 2022-12-06 LAB — CBC WITH DIFFERENTIAL (CANCER CENTER ONLY)
Abs Immature Granulocytes: 0.03 10*3/uL (ref 0.00–0.07)
Basophils Absolute: 0.1 10*3/uL (ref 0.0–0.1)
Basophils Relative: 1 %
Eosinophils Absolute: 0.2 10*3/uL (ref 0.0–0.5)
Eosinophils Relative: 2 %
HCT: 44 % (ref 36.0–46.0)
Hemoglobin: 14.6 g/dL (ref 12.0–15.0)
Immature Granulocytes: 0 %
Lymphocytes Relative: 27 %
Lymphs Abs: 2.4 10*3/uL (ref 0.7–4.0)
MCH: 31.2 pg (ref 26.0–34.0)
MCHC: 33.2 g/dL (ref 30.0–36.0)
MCV: 94 fL (ref 80.0–100.0)
Monocytes Absolute: 0.8 10*3/uL (ref 0.1–1.0)
Monocytes Relative: 9 %
Neutro Abs: 5.5 10*3/uL (ref 1.7–7.7)
Neutrophils Relative %: 61 %
Platelet Count: 321 10*3/uL (ref 150–400)
RBC: 4.68 MIL/uL (ref 3.87–5.11)
RDW: 13.7 % (ref 11.5–15.5)
WBC Count: 9 10*3/uL (ref 4.0–10.5)
nRBC: 0 % (ref 0.0–0.2)

## 2022-12-06 LAB — IRON AND IRON BINDING CAPACITY (CC-WL,HP ONLY)
Iron: 146 ug/dL (ref 28–170)
Saturation Ratios: 48 % — ABNORMAL HIGH (ref 10.4–31.8)
TIBC: 304 ug/dL (ref 250–450)
UIBC: 158 ug/dL (ref 148–442)

## 2022-12-06 LAB — FERRITIN: Ferritin: 61 ng/mL (ref 11–307)

## 2022-12-07 ENCOUNTER — Inpatient Hospital Stay: Payer: BC Managed Care – PPO

## 2022-12-07 LAB — KAPPA/LAMBDA LIGHT CHAINS
Kappa free light chain: 48.1 mg/L — ABNORMAL HIGH (ref 3.3–19.4)
Kappa, lambda light chain ratio: 6.25 — ABNORMAL HIGH (ref 0.26–1.65)
Lambda free light chains: 7.7 mg/L (ref 5.7–26.3)

## 2022-12-11 LAB — MULTIPLE MYELOMA PANEL, SERUM
Albumin SerPl Elph-Mcnc: 4 g/dL (ref 2.9–4.4)
Albumin/Glob SerPl: 1.1 (ref 0.7–1.7)
Alpha 1: 0.2 g/dL (ref 0.0–0.4)
Alpha2 Glob SerPl Elph-Mcnc: 0.7 g/dL (ref 0.4–1.0)
B-Globulin SerPl Elph-Mcnc: 0.8 g/dL (ref 0.7–1.3)
Gamma Glob SerPl Elph-Mcnc: 2.1 g/dL — ABNORMAL HIGH (ref 0.4–1.8)
Globulin, Total: 3.9 g/dL (ref 2.2–3.9)
IgA: 52 mg/dL — ABNORMAL LOW (ref 87–352)
IgG (Immunoglobin G), Serum: 2442 mg/dL — ABNORMAL HIGH (ref 586–1602)
IgM (Immunoglobulin M), Srm: 91 mg/dL (ref 26–217)
M Protein SerPl Elph-Mcnc: 1.5 g/dL — ABNORMAL HIGH
Total Protein ELP: 7.9 g/dL (ref 6.0–8.5)

## 2022-12-14 ENCOUNTER — Encounter: Payer: Self-pay | Admitting: Nurse Practitioner

## 2022-12-14 ENCOUNTER — Inpatient Hospital Stay (HOSPITAL_BASED_OUTPATIENT_CLINIC_OR_DEPARTMENT_OTHER): Payer: BC Managed Care – PPO | Admitting: Nurse Practitioner

## 2022-12-14 VITALS — BP 125/79 | HR 83 | Temp 97.9°F | Resp 18 | Ht 68.0 in | Wt 197.9 lb

## 2022-12-14 DIAGNOSIS — D472 Monoclonal gammopathy: Secondary | ICD-10-CM

## 2022-12-14 NOTE — Assessment & Plan Note (Addendum)
--  Labs showed white blood cell count 8.8, hemoglobin 14.3, MCV 89.9, and platelets of 308 with M protein stable at 1.7. --recommend a metastatic bone survey yearly to assess for lytic lesions.  --bone marrow biopsy performed on 08/05/2021 showed 9% plasma cells by manual count, consistent with MGUS.  --NM bone Survey done 06/22/2022 showing no focal lytic or sclerotic lesions --labs 12/14/2022 -Labs today show lipid cell count 9.0, hemoglobin 14.6, hematocrit 44.0, and platelets 321.  M protein is stable and improved at 1.5. --follow up 6 months and sooner for concerning symptoms.

## 2022-12-14 NOTE — Progress Notes (Signed)
Patient Care Team: Orland Mustard, MD as PCP - General (Family Medicine) Jerene Bears, MD as Consulting Physician (Gynecology) Drema Dallas, DO as Consulting Physician (Neurology) Jaci Standard, MD as Consulting Physician (Hematology and Oncology)  Clinic Day:  12/14/2022  Referring physician: Ollen Bowl, MD  ASSESSMENT & PLAN:   Assessment & Plan: MGUS (monoclonal gammopathy of unknown significance) --Labs showed white blood cell count 8.8, hemoglobin 14.3, MCV 89.9, and platelets of 308 with M protein stable at 1.7. --recommend a metastatic bone survey yearly to assess for lytic lesions.  --bone marrow biopsy performed on 08/05/2021 showed 9% plasma cells by manual count, consistent with MGUS.  --NM bone Survey done 06/22/2022 showing no focal lytic or sclerotic lesions --labs 12/14/2022 -  --follow up 6 months and sooner for concerning symptoms.   Plan:  Labs reviewed  -CBC showing WBC 9.0; Hgb 14.6; Hct 44.0; Plt 324; Anc 5.5 -CMP - K 4.1; glucose 92; BUN 15; Creatinine 1.00; eGFR > 60; Ca 10.4; LFTs normal LFTs.   -Ferritin 61; absolute reticulocyte 70.5; kappa light chains 48.1; lambda light chains 7.7; kappa lambda ratio 6.25. -Iron 146; TIBC 304; saturation ratio 48. For mild hypercalcemia - recommended increasing Vitamin d to 5000 international unit daily.  Labs, including 24 hour urine for urine protein and light chains, and follow up in 6 months.  She understands she may follow up sooner for concerning symptoms.   The patient understands the plans discussed today and is in agreement with them.  She knows to contact our office if she develops concerns prior to her next appointment.  I provided 25 minutes of face-to-face time during this encounter and > 50% was spent counseling as documented under my assessment and plan.    Gwendolyn Jews, NP  [facility] - A DEPT OF [facility] [address] [place] [address] Dept: [phone] Dept Fax: [phone]   No orders of the defined types were placed in this encounter.     CHIEF COMPLAINT:  CC: MGUS  Current Treatment:  surveillance   INTERVAL HISTORY:  Gwendolyn Ramos is here today for repeat clinical assessment. She last saw Dr. Leonides Schanz on 06/15/2022. Since then, she did have NM bone survey on 06/22/2022 which was negative for presence of lytic lesions.  She reports weakness in both hands and forearms and cramping in her legs.  She does not need to take Tylenol or other over-the-counter pain medication for this.  Sports medicine provider concern for developing multiple myeloma.  Labs indicate MGUS/smoldering multiple myeloma stable.  She denies fevers or chills. Her appetite is good. Her weight has increased 6 pounds over last 6 months  .  I have reviewed the past medical history, pa st surgical history, social history and family history with the patient and they are unchanged from previous note.  ALLERGIES:  is allergic to levaquin [levofloxacin], methylchloroisothiazolinone [methylisothiazolinone], and keflex [cephalexin].  MEDICATIONS:  Current Outpatient Medications  Medication Sig Dispense Refill   Ascorbic Acid (VITAMIN C) 500 MG CAPS as directed Orally twice a day     betamethasone dipropionate 0.05 % cream Apply topically 2 (two) times daily.     buPROPion (WELLBUTRIN SR) 150 MG 12 hr tablet Take 150 mg by mouth 2 (two) times daily.     Cholecalciferol (VITAMIN D-3 PO) Take 1 tablet by mouth daily.     Erenumab-aooe (AIMOVIG) 140 MG/ML SOAJ Inject 140 mg into  the skin every 28 (twenty-eight) days. 1.12 mL 11   estradiol (ESTRACE) 0.1 MG/GM vaginal cream 1 gram pv twice weekly 42.5 g 3   metoprolol tartrate (LOPRESSOR) 100 MG tablet Take 1 tablet by mouth once for procedure. 1 tablet 0   Multiple Vitamins-Minerals (MULTIVITAMIN WITH MINERALS) tablet Take 1 tablet by mouth daily.     Omega-3 Fatty Acids (FISH OIL) 1000  MG CAPS Take by mouth.     omeprazole (PRILOSEC) 20 MG capsule Take 20 mg by mouth daily.     OVER THE COUNTER MEDICATION Magnesium 325mg -Take 2 capsule by mouth daily.     OZEMPIC, 0.25 OR 0.5 MG/DOSE, 2 MG/3ML SOPN SMARTSIG:0.25 Milligram(s) SUB-Q Once a Week     rosuvastatin (CRESTOR) 10 MG tablet Take 1 tablet (10 mg total) by mouth daily. 90 tablet 3   triamcinolone cream (KENALOG) 0.1 % Apply topically 2 (two) times daily.     Ubrogepant (UBRELVY) 100 MG TABS Take 1 tablet (100 mg total) by mouth as needed (May repeat in 2 hours if needed.  Maximum 2 tablets in 24 hours.). 10 tablet 11   valACYclovir (VALTREX) 500 MG tablet Take 1 tablet (500 mg total) by mouth daily. 90 tablet 4   No current facility-administered medications for this visit.     REVIEW OF SYSTEMS:   Constitutional: Denies fevers, chills or abnormal weight loss. Mild fatigue Eyes: Denies blurriness of vision Ears, nose, mouth, throat, and face: Denies mucositis or sore throat Respiratory: Denies cough, dyspnea or wheezes Cardiovascular: Denies palpitation, chest discomfort or lower extremity swelling Gastrointestinal:  Denies nausea, heartburn or change in bowel habits Skin: Denies abnormal skin rashes Lymphatics: Denies new lymphadenopathy or easy bruising Neurological:Denies numbness, tingling or new weaknesses Behavioral/Psych: Mood is stable, no new changes  Musculoskeletal: Weakness present in both forearms and hands.  Having cramps in her legs.  Will both symptoms are intermittent and manageable. All other systems were reviewed with the patient and are negative.   VITALS:   Today's Vitals   12/14/22 0843  BP: 125/79  Pulse: 83  Resp: 18  Temp: 97.9 F (36.6 C)  TempSrc: Tympanic  SpO2: 97%  Weight: 197 lb 14.4 oz (89.8 kg)  Height: 5\' 8"  (1.727 m)   Body mass index is 30.09 kg/m.   Wt Readings from Last 3 Encounters:  12/14/22 197 lb 14.4 oz (89.8 kg)  11/22/22 194 lb (88 kg)  08/22/22 191  lb (86.6 kg)    Body mass index is 30.09 kg/m.  Performance status (ECOG): 1 - Symptomatic but completely ambulatory  PHYSICAL EXAM:   GENERAL:alert, no distress and comfortable SKIN: skin color, texture, turgor are normal, no rashes or significant lesions EYES: normal, Conjunctiva are pink and non-injected, sclera clear OROPHARYNX:no exudate, no erythema and lips, buccal mucosa, and tongue normal  NECK: supple, thyroid normal size, non-tender, without nodularity LYMPH:  no palpable lymphadenopathy in the cervical, axillary or inguinal LUNGS: clear to auscultation and percussion with normal breathing effort HEART: regular rate & rhythm and no murmurs and no lower extremity edema ABDOMEN:abdomen soft, non-tender and normal bowel sounds Musculoskeletal:no cyanosis of digits and no clubbing  NEURO: alert & oriented x 3 with fluent speech, no focal motor/sensory deficits  LABORATORY DATA:  I have reviewed the data as listed    Component Value Date/Time   NA 139 12/06/2022 0758   NA 136 03/28/2022 1030   K 4.1 12/06/2022 0758   CL 104 12/06/2022 0758   CO2 28 12/06/2022  0758   GLUCOSE 92 12/06/2022 0758   BUN 15 12/06/2022 0758   BUN 10 03/28/2022 1030   CREATININE 1.00 12/06/2022 0758   CALCIUM 10.4 (H) 12/06/2022 0758   PROT 8.4 (H) 12/06/2022 0758   ALBUMIN 4.5 12/06/2022 0758   AST 23 12/06/2022 0758   ALT 28 12/06/2022 0758   ALKPHOS 53 12/06/2022 0758   BILITOT 0.5 12/06/2022 0758   GFRNONAA >60 12/06/2022 0758   GFRAA >60 09/09/2019 1434   Dx: MGUS (monoclonal gammopathy of unknow...   0 Result Notes          Component Ref Range & Units 8 d ago (12/06/22) 6 mo ago (06/07/22) 1 yr ago (12/07/21) 1 yr ago (06/30/21) 1 yr ago (12/30/20) 2 yr ago (09/03/20) 2 yr ago (03/18/20)  IgG (Immunoglobin G), Serum 586 - 1,602 mg/dL 6,295 High  2,841 High  2,402 High  2,400 High  2,294 High  2,456 High  2,629 High   IgA 87 - 352 mg/dL 52 Low  47 Low  CM 54 Low  53 Low  60 Low   57 Low  56 Low   IgM (Immunoglobulin M), Srm 26 - 217 mg/dL 91 85 76 87 79 76 73  Total Protein ELP 6.0 - 8.5 g/dL 7.9 VC 7.8 VC 7.6 VC 8.1 VC 8.1 VC 8.1 VC 8.2 VC  Albumin SerPl Elph-Mcnc 2.9 - 4.4 g/dL 4.0 VC 3.8 VC 4.2 VC 4.2 VC 4.1 VC 4.1 VC 4.1 VC  Alpha 1 0.0 - 0.4 g/dL 0.2 VC 0.2 VC 0.2 VC 0.2 VC 0.2 VC 0.2 VC 0.2 VC  Alpha2 Glob SerPl Elph-Mcnc 0.4 - 1.0 g/dL 0.7 VC 0.8 VC 0.6 VC 0.7 VC 0.7 VC 0.7 VC 0.7 VC  B-Globulin SerPl Elph-Mcnc 0.7 - 1.3 g/dL 0.8 VC 0.8 VC 0.8 VC 0.9 VC 0.9 VC 0.9 VC 0.9 VC  Gamma Glob SerPl Elph-Mcnc 0.4 - 1.8 g/dL 2.1 High  VC 2.2 High  VC 1.9 High  VC 2.2 High  VC 2.2 High  VC 2.1 High  VC 2.3 High  VC  M Protein SerPl Elph-Mcnc Not Observed g/dL 1.5 High  VC 1.7 High  VC 1.3 High  VC 1.7 High  VC 1.3 High  VC 1.8 High  VC 1.7 High  VC  Globulin, Total 2.2 - 3.9 g/dL 3.9 VC 4.0 High  VC 3.4 VC 3.9 VC 4.0 High  VC 4.0 High  VC 4.1 High  VC  Albumin/Glob SerPl 0.7 - 1.7 1.1 VC 1.0 VC 1.3 VC 1.1 VC 1.1 VC 1.1 VC 1.1 VC  IFE 1 Comment Abnormal  VC Comment Abnormal              Lab Results  Component Value Date   WBC 9.0 12/06/2022   NEUTROABS 5.5 12/06/2022   HGB 14.6 12/06/2022   HCT 44.0 12/06/2022   MCV 94.0 12/06/2022   PLT 321 12/06/2022     RADIOGRAPHIC STUDIES: Study Result  Narrative & Impression  CLINICAL DATA:  MGUS, assess for lytic lesions.   EXAM: METASTATIC BONE SURVEY  COMPARISON:  12/30/2020  FINDINGS: Examination demonstrates no evidence of focal lytic or sclerotic lesions. Mild spondylosis throughout the spine. No acute findings.  IMPRESSION: No focal lytic or sclerotic lesions.  Electronically Signed   By: Elberta Fortis M.D.   On: 06/26/2022 11:43       MM 3D DIAGNOSTIC MAMMOGRAM UNILATERAL LEFT BREAST  Result Date: 11/29/2022 CLINICAL DATA:  Short-term follow-up for a probably  benign left breast calcifications, initially assessed with diagnostic imaging on 05/24/2022. Patient underwent bilateral breast  reduction surgery between her 04/27/2021 screening mammogram and her 05/05/2022 screening mammogram. EXAM: DIGITAL DIAGNOSTIC UNILATERAL LEFT MAMMOGRAM WITH TOMOSYNTHESIS AND CAD TECHNIQUE: Left digital diagnostic mammography and breast tomosynthesis was performed. The images were evaluated with computer-aided detection. COMPARISON:  Previous exam(s). ACR Breast Density Category b: There are scattered areas of fibroglandular density. FINDINGS: The loosely grouped left breast calcifications noted on the previous diagnostic study are unchanged. There are no breast masses, areas of architectural distortion or new calcifications. IMPRESSION: 1. Probably benign left breast calcifications, stable for 6 months. Additional short-term follow-up recommended. RECOMMENDATION: Diagnostic bilateral mammography with left breast magnification views in 6 months. I have discussed the findings and recommendations with the patient. If applicable, a reminder letter will be sent to the patient regarding the next appointment. BI-RADS CATEGORY  3: Probably benign. Electronically Signed   By: Amie Portland M.D.   On: 11/29/2022 11:44

## 2023-01-05 ENCOUNTER — Ambulatory Visit: Payer: BC Managed Care – PPO | Admitting: Family Medicine

## 2023-01-30 ENCOUNTER — Ambulatory Visit: Payer: BC Managed Care – PPO | Admitting: Neurology

## 2023-02-10 ENCOUNTER — Telehealth: Payer: Self-pay

## 2023-02-10 NOTE — Telephone Encounter (Signed)
PA needed for Ubrelvy.

## 2023-02-14 ENCOUNTER — Ambulatory Visit: Payer: BC Managed Care – PPO | Admitting: Neurology

## 2023-02-16 NOTE — Progress Notes (Signed)
NEUROLOGY FOLLOW UP OFFICE NOTE  Gwendolyn Ramos [id number]  Assessment/Plan:    Migraine without aura, without status migrainosus, not intractable     1.  Migraine prevention: Aimovig 140mg  every 28 days 2.  Migraine rescue:  Ubrelvy100mg   3.  Limit use of pain relievers to no more than 2 days out of week to prevent risk of rebound or medication-overuse headache. 4.  Keep headache diary 5.  Follow up with me in 6 months.   Subjective:  Gwendolyn Ramos is a 58 year old female with MGUS who follows up for migraines.   UPDATE: Due to side effects, changed from topiramate to Aimovig.  Improved. Migraines occur 1 to 2 days a month, lasts 30 minutes with Ubrelvy (occasionally takes second dose) Mild headaches occur 3 times a month.  Brief with Aleve or Bernita Raisin Often gets them in middle of night.   Dizziness is improved.  Usually positional, when standing up too fast.  Brief.   Current analgesics/NSAIDs or triptans Current NSAIDS:  none Current analgesics:  none Current triptans:  none Current ergotamine:  none Current anti-emetic:  none Current muscle relaxants:  none Current anti-anxiolytic:  none Current sleep aide:  none Current Antihypertensive medications:  none Current Antidepressant medications:  Wellbutrin Current Anticonvulsant medications:  none Current anti-CGRP:  Aimovig 140mg , Ubrelvy 100mg  Other therapy:  Aroma therapy.  She does receive Botox for cosmetic reasons.   Caffeine:  1 to 2 cups of coffee daily Diet:  At least 64 oz water daily. Does not skip meals Exercise:  Routine.  Walk, golf, tennis Depression:  Yes but stable; Anxiety:  no Other pain:  neck pain improved with Dr. Katrinka Blazing.  Still with some back pain.  Scheduled to undergo breast reduction in December.   Sleep hygiene:  good     HISTORY:  Onset:  High school and college Location:  1.  Diffuse; 2.  Unilateral either side; 3.  Bilateral retro-orbital.  Sometimes associated neck  pain. Quality:  1.  Pressure; 2.  Throbbing; 3.  Stabbing Initial Intensity:  1.  Mild-moderate; 2.  Severe; 3.  Severe.  She denies new headache, thunderclap headache or severe headache that wakes her from sleep. Aura:  no Associated symptoms:  Photophobia, phonophobia, osmophobia, dizziness, sometimes blurred vision.  No recent nausea or vomiting.  She denies associated unilateral numbness or weakness. Initial Duration:  Hour to all day Initial Frequency:  Daily headache (severe headaches occur 1 to 2 times a month) Frequency of abortive medication: She pre-treats with Aleve to prevent progression into migraine.  Takes Aleve 4 to 5 days a week. Triggers:  Valsalva, hitting head, menopause; neck pain; perfumes or gasoline, hunger, sugar/sweets Relieving factors:  Aroma therapy; hot shower Activity:  Aggravates.   On 04/17/2021, she had a new type of headache.  She describes a 7/10 searing paroxysmal pain on left greater than right side of head.  She had associated persistent left sided facial numbness as well.  She went to the ED where brain imaging was suggested but she decided to leave.  It lasted an entire day.  She always has a dull head pressure but she had no headache at all for 3 days.  To evaluate new paroxysmal headache and left facial numbness, she had an MRI/MRA of head on 05/19/2021 was unremarkable.      Past NSAIDS:  Advil, Aleve (upsets stomach) Past analgesics:  Tylenol with codeine; Tylenol Past abortive triptans:  Imitrex, Maxalt Past abortive ergotamine:  none  Past muscle relaxants:  Soma; Flexeril Past anti-emetic:  none Past antihypertensive medications:  metoprolol Past antidepressant medications:  none Past anticonvulsant medications:  topiramate Past anti-CGRP:  none Past vitamins/Herbal/Supplements:  none Past antihistamines/decongestants:  none Other past therapies:  none     Family history of headache:  Mother (migraines); daughter (migraines)  PAST MEDICAL  HISTORY: Past Medical History:  Diagnosis Date   Anxiety    COVID-19 08/2020   mild symptoms, no treatment, all symptoms resolved as of 10/21/20 per patient   Depression    Eczema of hand    both hands   GERD (gastroesophageal reflux disease)    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 10/21/20    MEDICATIONS: Current Outpatient Medications on File Prior to Visit  Medication Sig Dispense Refill   Ascorbic Acid (VITAMIN C) 500 MG CAPS as directed Orally twice a day     betamethasone dipropionate 0.05 % cream Apply topically 2 (two) times daily.     buPROPion (WELLBUTRIN SR) 150 MG 12 hr tablet Take 150 mg by mouth 2 (two) times daily.     Cholecalciferol (VITAMIN D-3 PO) Take 1 tablet by mouth daily.     Erenumab-aooe (AIMOVIG) 140 MG/ML SOAJ Inject 140 mg into the skin every 28 (twenty-eight) days. 1.12 mL 11   estradiol (ESTRACE) 0.1 MG/GM vaginal cream 1 gram pv twice weekly 42.5 g 3   metoprolol tartrate (LOPRESSOR) 100 MG tablet Take 1 tablet by mouth once for procedure. 1 tablet 0   Multiple Vitamins-Minerals (MULTIVITAMIN WITH MINERALS) tablet Take 1 tablet by mouth daily.     Omega-3 Fatty Acids (FISH OIL) 1000 MG CAPS Take by mouth.     omeprazole (PRILOSEC) 20 MG capsule Take 20 mg by mouth daily.     OVER THE COUNTER MEDICATION Magnesium 325mg -Take 2 capsule by mouth daily.     OZEMPIC, 0.25 OR 0.5 MG/DOSE, 2 MG/3ML SOPN SMARTSIG:0.25 Milligram(s) SUB-Q Once a Week     rosuvastatin (CRESTOR) 10 MG tablet Take 1 tablet (10 mg total) by mouth daily. 90 tablet 3    triamcinolone cream (KENALOG) 0.1 % Apply topically 2 (two) times daily.     Ubrogepant (UBRELVY) 100 MG TABS Take 1 tablet (100 mg total) by mouth as needed (May repeat in 2 hours if needed.  Maximum 2 tablets in 24 hours.). 10 tablet 11   valACYclovir (VALTREX) 500 MG tablet Take 1 tablet (500 mg total) by mouth daily. 90 tablet 4   No current facility-administered medications on file prior to visit.    ALLERGIES: Allergies  Allergen Reactions   Levaquin [Levofloxacin]     High fever, elevated heart rate   Methylchloroisothiazolinone [Methylisothiazolinone]     In liquid soaps causes skin irritation.   Keflex [Cephalexin] Rash    Caused possible rash of arms, HA    FAMILY HISTORY: Family History  Problem Relation Age of Onset   Diabetes Mother    COPD Mother    Congestive Heart Failure Mother  Thyroid disease Mother    Hemochromatosis Father    Alcoholism Father    Breast cancer Sister 52       invasive lobular breast cancer, negative genetic testing   Diabetes Sister    Asthma Sister    Asthma Brother    Diabetes Maternal Aunt    Breast cancer Maternal Aunt    Heart attack Maternal Grandfather    Diabetes Other       Objective:  Blood pressure 126/77, pulse (!) 102, height 5\' 9"  (1.753 m), weight 205 lb (93 kg), SpO2 97%. General: No acute distress.  Patient appears well-groomed.      Gwendolyn Millet, DO  CC: Ardean Larsen, MD

## 2023-02-17 ENCOUNTER — Ambulatory Visit (INDEPENDENT_AMBULATORY_CARE_PROVIDER_SITE_OTHER): Payer: BC Managed Care – PPO | Admitting: Neurology

## 2023-02-17 ENCOUNTER — Encounter: Payer: Self-pay | Admitting: Neurology

## 2023-02-17 VITALS — BP 126/77 | HR 102 | Ht 69.0 in | Wt 205.0 lb

## 2023-02-17 DIAGNOSIS — G43009 Migraine without aura, not intractable, without status migrainosus: Secondary | ICD-10-CM | POA: Diagnosis not present

## 2023-02-17 NOTE — Patient Instructions (Signed)
Aimovig every 4 weeks Ubrelvy if needed

## 2023-02-23 ENCOUNTER — Telehealth: Payer: Self-pay | Admitting: Pharmacy Technician

## 2023-02-23 ENCOUNTER — Other Ambulatory Visit (HOSPITAL_COMMUNITY): Payer: Self-pay

## 2023-02-23 NOTE — Telephone Encounter (Signed)
Pharmacy Patient Advocate Encounter   Received notification from Pt Calls Messages that prior authorization for Ubrelvy 100mg  is required/requested.   Insurance verification completed.   The patient is insured through [facility] .   Per test claim: PA required; PA submitted to above mentioned insurance via CoverMyMeds Key/confirmation #/EOC  [id number] Status is pending

## 2023-02-23 NOTE — Telephone Encounter (Signed)
PA request has been Started. New Encounter created for follow up. For additional info see Pharmacy Prior Auth telephone encounter from 02/23/23.

## 2023-02-24 NOTE — Telephone Encounter (Signed)
Pharmacy Patient Advocate Encounter  Received notification from [facility] that Prior Authorization for UBRELVY 100MG  has been CANCELLED due to   PA #/Case ID/Reference #: [id number]

## 2023-03-23 ENCOUNTER — Other Ambulatory Visit (HOSPITAL_COMMUNITY)
Admission: RE | Admit: 2023-03-23 | Discharge: 2023-03-23 | Disposition: A | Discharge status: Home / Self Care | Payer: BC Managed Care – PPO | Source: Ambulatory Visit | Attending: Obstetrics & Gynecology | Admitting: Obstetrics & Gynecology

## 2023-03-23 ENCOUNTER — Encounter (HOSPITAL_BASED_OUTPATIENT_CLINIC_OR_DEPARTMENT_OTHER): Payer: Self-pay | Admitting: Obstetrics & Gynecology

## 2023-03-23 ENCOUNTER — Ambulatory Visit (HOSPITAL_BASED_OUTPATIENT_CLINIC_OR_DEPARTMENT_OTHER): Payer: BC Managed Care – PPO | Admitting: Obstetrics & Gynecology

## 2023-03-23 VITALS — BP 121/82 | HR 69 | Ht 69.0 in | Wt 200.6 lb

## 2023-03-23 DIAGNOSIS — Z01419 Encounter for gynecological examination (general) (routine) without abnormal findings: Secondary | ICD-10-CM

## 2023-03-23 DIAGNOSIS — D472 Monoclonal gammopathy: Secondary | ICD-10-CM

## 2023-03-23 DIAGNOSIS — L900 Lichen sclerosus et atrophicus: Secondary | ICD-10-CM | POA: Diagnosis not present

## 2023-03-23 DIAGNOSIS — N898 Other specified noninflammatory disorders of vagina: Secondary | ICD-10-CM

## 2023-03-23 DIAGNOSIS — B009 Herpesviral infection, unspecified: Secondary | ICD-10-CM

## 2023-03-23 DIAGNOSIS — Z803 Family history of malignant neoplasm of breast: Secondary | ICD-10-CM

## 2023-03-23 DIAGNOSIS — N952 Postmenopausal atrophic vaginitis: Secondary | ICD-10-CM

## 2023-03-23 MED ORDER — VALACYCLOVIR HCL 500 MG PO TABS
500.0000 mg | ORAL_TABLET | Freq: Every day | ORAL | 4 refills | Status: AC
Start: 2023-03-23 — End: ?

## 2023-03-23 MED ORDER — ESTRADIOL 7.5 MCG/24HR VA RING: 1.0000 | VAGINAL_RING | VAGINAL | 3 refills | Status: DC

## 2023-03-23 MED ORDER — ESTRADIOL 10 MCG VA TABS
1.0000 | ORAL_TABLET | VAGINAL | 3 refills | Status: DC
Start: 2023-03-23 — End: 2023-08-22

## 2023-03-23 NOTE — Progress Notes (Signed)
 ANNUAL EXAM Patient name: Gwendolyn Ramos MRN [id number]  Date of birth: 1965-04-01 Chief Complaint:   Annual Exam  History of Present Illness:   Gwendolyn Ramos is a 58 y.o. G33P2012 Caucasian female being seen today for a routine annual exam.  Doing well.  Does have vaginal spotting  This started after having posterior repair and mid urethral sling.  Does have painful intercourse that is mostly present with insertion.  Feels tissue is thin.  Has been prescribed vaginal estrogen cream.  Doesn't use regularly as is messy.  Other options including vagifem and estring discussed.  She would like to try one of them.  No LMP recorded (lmp unknown). Patient is postmenopausal.   Last pap 02/2021. Results were: NILM w/ HRHPV negative. H/O abnormal pap: no Last mammogram: 04/2022. Results were: normal. Had 6 month follow up in November.  Family h/o breast cancer: sister.   Last colonoscopy: 2017. Results were: normal. Family h/o colorectal cancer: no     03/21/2022    8:38 AM 03/12/2021    9:19 AM  Depression screen PHQ 2/9  Decreased Interest 0 0  Down, Depressed, Hopeless 0 0  PHQ - 2 Score 0 0     Review of Systems:   Pertinent items are noted in HPI  Denies any headaches, blurred vision, fatigue, shortness of breath, chest pain, abdominal pain, abnormal vaginal discharge/itching/odor/irritation, problems with periods, bowel movements, urination, or intercourse unless otherwise stated above. Pertinent History Reviewed:  Reviewed past medical,surgical, social and family history.  Reviewed problem list, medications and allergies. Physical Assessment:   Vitals:   03/23/23 1311  BP: 121/82  Pulse: 69  Weight: 200 lb 9.6 oz (91 kg)  Height: 5\' 9"  (1.753 m)  Body mass index is 29.62 kg/m.        Physical Examination:   General appearance - well appearing, and in no distress  Mental status - alert, oriented to person, place, and time  Psych:  She has a normal mood and  affect  Skin - warm and dry, normal color, no suspicious lesions noted  Chest - effort normal, all lung fields clear to auscultation bilaterally  Heart - normal rate and regular rhythm  Neck:  midline trachea, no thyromegaly or nodules  Breasts - breasts appear normal, no suspicious masses, no skin or nipple changes or  axillary nodes  Abdomen - soft, nontender, nondistended, no masses or organomegaly  Pelvic - VULVA: normal appearing vulva with no masses, tenderness or lesions   VAGINA: normal appearing vagina with normal color and discharge, no lesions   CERVIX: normal appearing cervix without discharge or lesions, no CMT  Thin prep pap was not done  UTERUS: uterus is felt to be normal size, shape, consistency and nontender   ADNEXA: No adnexal masses or tenderness noted.  Extremities:  No swelling or varicosities noted  Chaperone present for exam  Assessment & Plan:  1. Well woman exam with routine gynecological exam (Primary) - Pap smear 02/2021 normal.  Follow up 3 years. - Mammogram 04/2022 - Colonoscopy 2017.  WNL. - lab work done with PCP, Dr. Artis Flock - vaccines reviewed/updated  2. Lichen sclerosus - exam is normal today  3. MGUS (monoclonal gammopathy of unknown significance)  4. Hereditary hemochromatosis (HCC)  5. Vaginal atrophy - rx for estring 2mg  vaginally q 3 months and vagifem to see which is covered best - Estradiol (VAGIFEM) 10 MCG TABS vaginal tablet; Place 1 tablet (10 mcg total) vaginally 2 (two) times  a week.  Dispense: 24 tablet; Refill: 3 - recheck 3 months - will r/o any vaginitis causing symptoms  6. HSV (herpes simplex virus) infection - valACYclovir (VALTREX) 500 MG tablet; Take 1 tablet (500 mg total) by mouth daily.  Dispense: 90 tablet; Refill: 4  7. Family history of breast cancer   Meds:  Meds ordered this encounter  Medications   valACYclovir (VALTREX) 500 MG tablet    Sig: Take 1 tablet (500 mg total) by mouth daily.    Dispense:  90  tablet    Refill:  4   Estradiol (VAGIFEM) 10 MCG TABS vaginal tablet    Sig: Place 1 tablet (10 mcg total) vaginally 2 (two) times a week.    Dispense:  24 tablet    Refill:  3    Refill with 24 tabs.    Follow-up: Return in about 1 year (around 03/22/2024).  Jerene Bears, MD 03/23/2023 1:46 PM

## 2023-03-26 LAB — CERVICOVAGINAL ANCILLARY ONLY
Bacterial Vaginitis (gardnerella): NEGATIVE
Candida Glabrata: NEGATIVE
Candida Vaginitis: NEGATIVE
Comment: NEGATIVE
Comment: NEGATIVE
Comment: NEGATIVE

## 2023-03-27 ENCOUNTER — Encounter (HOSPITAL_BASED_OUTPATIENT_CLINIC_OR_DEPARTMENT_OTHER): Payer: Self-pay | Admitting: Obstetrics & Gynecology

## 2023-03-29 ENCOUNTER — Other Ambulatory Visit: Payer: Self-pay | Admitting: Cardiovascular Disease

## 2023-04-06 ENCOUNTER — Other Ambulatory Visit: Payer: Self-pay

## 2023-04-06 ENCOUNTER — Other Ambulatory Visit: Payer: Self-pay | Admitting: Cardiovascular Disease

## 2023-04-06 MED ORDER — ROSUVASTATIN CALCIUM 10 MG PO TABS: 10.0000 mg | ORAL_TABLET | Freq: Every day | ORAL | 0 refills | Status: DC

## 2023-04-06 NOTE — Telephone Encounter (Signed)
 Patient identification verified by 2 forms. Marilynn Rail, RN    Called and spoke to patient  Informed patient due for annual OV  Patient scheduled for 4/7 at 3:10pm with NP [facility]  Patient agrees, no questions at this time

## 2023-04-17 ENCOUNTER — Telehealth: Payer: Self-pay | Admitting: Neurology

## 2023-04-17 NOTE — Telephone Encounter (Signed)
 Patient needs to speak to someone about getting a  sleep apnea  testing

## 2023-04-17 NOTE — Telephone Encounter (Signed)
 Needs to contact PCP

## 2023-04-17 NOTE — Telephone Encounter (Signed)
 Patient thanked me for calling. Will follow up with primary

## 2023-04-19 NOTE — Telephone Encounter (Signed)
 Patient scheduled be seen next week. Medication to be refilled at appt

## 2023-04-30 NOTE — Progress Notes (Unsigned)
 Cardiology Office Note    Date:  05/03/2023  ID:  Gwendolyn Ramos, DOB 1965/09/23, MRN [id number] PCP:  Orland Mustard, MD  Cardiologist:  Reatha Harps, MD  Electrophysiologist:  None   Chief Complaint: Follow up for non obstructive CAD   History of Present Illness: .    Gwendolyn Ramos is a 58 y.o. female with visit-pertinent history of hyperlipidemia, migraines and MGUS.  First evaluated by Dr. Flora Lipps on 03/2022 for evaluation of chest pain at the request of her PCP.  She had previously had coronary calcium scoring indicating a possible score of 0.5 in the RCA which places her in the 72nd percentile.  In March 2023 she was seen in the emergency room for chest pain symptoms, troponins were negative and EKG was normal.  She reported that she would have intermittent chest discomfort that would last for a few seconds no shortness of breath and no identifiable triggers.  Coronary CTA on 04/05/2022 indicated a coronary calcium score of 5.8, this was 77th percentile for age, sex and race matched control.  She had minimal soft plaque in the RCA and distal LAD.  Was recommended that she start on Crestor 10 mg daily.  Today she presents for follow-up.  She reports that she has been doing well overall.  She remains very active playing pickle ball and golf multiple times a week.  She also walks 3.5 miles 3 days a week and tolerates overall well.  She notes some intermittent burning sensation in her chest, feels that possibly related to acid reflux.  She also reports episodes of feeling dizzy and lightheaded with position changes such as when bending over to pick up a pickle ball.  She notes that she stays very well-hydrated frequently drinking water and electrolyte replacement beverages throughout the day.    ROS: .   Today she denies chest pain, shortness of breath, lower extremity edema, fatigue, palpitations, melena, hematuria, hemoptysis, diaphoresis, weakness, presyncope, syncope,  orthopnea, and PND.  All other systems are reviewed and otherwise negative. Studies Reviewed: Marland Kitchen   EKG:  EKG is ordered today, personally reviewed, demonstrating  EKG Interpretation Date/Time:  Monday May 01 2023 15:26:59 EDT Ventricular Rate:  75 PR Interval:  148 QRS Duration:  84 QT Interval:  384 QTC Calculation: 428 R Axis:   42  Text Interpretation: Normal sinus rhythm Normal ECG When compared with ECG of 17-Apr-2021 22:36, T wave amplitude has increased in Lateral leads Confirmed by Reather Littler [phone]) on 05/01/2023 3:35:05 PM   CV Studies: Cardiac studies reviewed are outlined and summarized above. Otherwise please see EMR for full report. Cardiac Studies & Procedures   ______________________________________________________________________________________________          CT SCANS  CT CORONARY MORPH W/CTA COR W/SCORE 04/05/2022  Addendum 04/08/2022  8:45 AM ADDENDUM REPORT: 04/08/2022 08:42  ADDENDUM: OVER-READ INTERPRETATION  CT CHEST  The following report is an over-read performed by radiologist Dr. Thora Lance III MD of [facility], [place]. This over-read does not include interpretation of cardiac or coronary anatomy or pathology. The CTA interpretation by cardiologist is attached.  No pericardial effusion.  No mediastinal mass or adenopathy.  Dependent atelectasis posteriorly in the visualized lung fields. No pleural effusion. No pneumothorax.  Regional bones unremarkable.  Small hiatal hernia.  Upper abdomen otherwise unremarkable.  IMPRESSION: No acute findings   Electronically Signed By: Corlis Leak M.D. On: 04/08/2022 08:42  Narrative CLINICAL DATA:  58 yo female with chest pain  EXAM: Cardiac/Coronary CTA  TECHNIQUE: A non-contrast, gated CT scan was obtained with axial slices of 3 mm through the heart for calcium scoring. Calcium scoring was performed using the Agatston method. A 120 kV prospective, gated, contrast cardiac  scan was obtained. Gantry rotation speed was 250 msecs and collimation was 0.6 mm. Two sublingual nitroglycerin tablets (0.8 mg) were given. The 3D data set was reconstructed in 5% intervals of the 35-75% of the R-R cycle. Diastolic phases were analyzed on a dedicated workstation using MPR, MIP, and VRT modes. The patient received 95 cc of contrast.  FINDINGS: Image quality: Excellent.  Noise artifact is: Limited.  Coronary Arteries:  Normal coronary origin.  Right dominance.  Left main: The left main is a large caliber vessel with a normal take off from the left coronary cusp that bifurcates to form a left anterior descending artery and a left circumflex artery. There is no plaque or stenosis.  Left anterior descending artery: The LAD has minimal (0-24) soft plaque in the distal vessel. The LAD gives off 3 small patent diagonal branches.  Left circumflex artery: The LCX is non-dominant and patent with no evidence of plaque or stenosis. The LCX terminates in large, branching OM with no plaque noted.  Right coronary artery: The RCA is dominant with normal take off from the right coronary cusp. There is minimal (0-24) soft plaque in the proximal to mid vessel. The RCA terminates as a PDA and right posterolateral branch without evidence of plaque or stenosis.  Right Atrium: Right atrial size is within normal limits.  Right Ventricle: The right ventricular cavity is within normal limits.  Left Atrium: Left atrial size is normal in size with no left atrial appendage filling defect.  Left Ventricle: The ventricular cavity size is within normal limits. There are no stigmata of prior infarction. There is no abnormal filling defect.  Pulmonary arteries: Normal in size without proximal filling defect.  Pulmonary veins: Normal pulmonary venous drainage.  Pericardium: Normal thickness with no significant effusion or calcium present.  Cardiac valves: The aortic valve is  trileaflet without significant calcification. The mitral valve is normal structure without significant calcification.  Aorta: Normal caliber with no significant disease.  Extra-cardiac findings: See attached radiology report for non-cardiac structures.  IMPRESSION: 1. Coronary calcium score of 5.80. This was 77 percentile for age-, sex, and race-matched controls.  2. Normal coronary origin with right dominance.  3. Minimal (0-24) soft plaque in the RCA and distal LAD.  RECOMMENDATIONS: CAD-RADS 1: Minimal non-obstructive CAD (0-24%). Consider non-atherosclerotic causes of chest pain. Consider preventive therapy and risk factor modification.  Olga Millers, MD  Electronically Signed: By: Olga Millers M.D. On: 04/05/2022 13:40     ______________________________________________________________________________________________       Current Reported Medications:.    Current Meds  Medication Sig   Ascorbic Acid (VITAMIN C) 500 MG CAPS as directed Orally twice a day   betamethasone dipropionate 0.05 % cream Apply topically 2 (two) times daily.   buPROPion (WELLBUTRIN SR) 150 MG 12 hr tablet Take 150 mg by mouth 2 (two) times daily.   Cholecalciferol (VITAMIN D-3 PO) Take 1 tablet by mouth daily.   co-enzyme Q-10 30 MG capsule Take 30 mg by mouth 3 (three) times daily.   Erenumab-aooe (AIMOVIG) 140 MG/ML SOAJ Inject 140 mg into the skin every 28 (twenty-eight) days.   estradiol (ESTRING) 7.5 MCG/24HR vaginal ring Place 1 each vaginally every 3 (three) months.   Multiple Vitamins-Minerals (MULTIVITAMIN WITH MINERALS) tablet Take  1 tablet by mouth daily.   Omega-3 Fatty Acids (FISH OIL) 1000 MG CAPS Take by mouth.   omeprazole (PRILOSEC) 20 MG capsule Take 20 mg by mouth daily.   OVER THE COUNTER MEDICATION Magnesium 325mg -Take 2 capsule by mouth daily.   OZEMPIC, 0.25 OR 0.5 MG/DOSE, 2 MG/3ML SOPN    Probiotic Product (PROBIOTIC-10 PO) Take by mouth.   rosuvastatin  (CRESTOR) 10 MG tablet TAKE 1 TABLET(10 MG) BY MOUTH DAILY   rosuvastatin (CRESTOR) 10 MG tablet Take 1 tablet (10 mg total) by mouth daily.   triamcinolone cream (KENALOG) 0.1 % Apply topically 2 (two) times daily.   Ubrogepant (UBRELVY) 100 MG TABS Take 1 tablet (100 mg total) by mouth as needed (May repeat in 2 hours if needed.  Maximum 2 tablets in 24 hours.).   valACYclovir (VALTREX) 500 MG tablet Take 1 tablet (500 mg total) by mouth daily.    Physical Exam:    VS:  BP 120/80 (BP Location: Right Arm, Patient Position: Sitting, Cuff Size: Normal)   Pulse 75   Ht 5\' 8"  (1.727 m)   Wt 199 lb 6.4 oz (90.4 kg)   LMP  (LMP Unknown) Comment: patient had spotting in October 2020  SpO2 94%   BMI 30.32 kg/m    Wt Readings from Last 3 Encounters:  05/01/23 199 lb 6.4 oz (90.4 kg)  03/23/23 200 lb 9.6 oz (91 kg)  02/17/23 205 lb (93 kg)    GEN: Well nourished, well developed in no acute distress NECK: No JVD; No carotid bruits CARDIAC: RRR, no murmurs, rubs, gallops RESPIRATORY:  Clear to auscultation without rales, wheezing or rhonchi  ABDOMEN: Soft, non-tender, non-distended EXTREMITIES:  No edema; No acute deformity     Asessement and Plan:.    Mild nonobstructive CAD: Noted on coronary CTA in 03/2022, she had minimal soft plaque in the RCA and distal LAD. Stable with no anginal symptoms. No indication for ischemic evaluation.  Heart healthy diet and regular cardiovascular exercise encouraged.  Continue rosuvastatin 10 mg daily.  Hyperlipidemia:Last lipid profile in 06/2022 indicated total cholesterol 143, HDL 55, triglycerides 67 and LDL 74. LDL goal less than 70. Heart healthy diet and regular cardiovascular exercise encouraged.  Check fasting lipid profile and LFTs.     Disposition: F/u with Dr. Flora Lipps in one year or sooner if needed.   Signed, Rip Harbour, NP

## 2023-05-01 ENCOUNTER — Ambulatory Visit: Attending: Cardiology | Admitting: Cardiology

## 2023-05-01 ENCOUNTER — Encounter: Payer: Self-pay | Admitting: Cardiology

## 2023-05-01 VITALS — BP 120/80 | HR 75 | Ht 68.0 in | Wt 199.4 lb

## 2023-05-01 DIAGNOSIS — E782 Mixed hyperlipidemia: Secondary | ICD-10-CM

## 2023-05-01 DIAGNOSIS — R931 Abnormal findings on diagnostic imaging of heart and coronary circulation: Secondary | ICD-10-CM | POA: Diagnosis not present

## 2023-05-01 NOTE — Patient Instructions (Signed)
 Medication Instructions:  No changes *If you need a refill on your cardiac medications before your next appointment, please call your pharmacy*  Lab Work: Within the next 2 weeks we are going to need a fasting lipid panel and LFT's If you have labs (blood work) drawn today and your tests are completely normal, you will receive your results only by: MyChart Message (if you have MyChart) OR A paper copy in the mail If you have any lab test that is abnormal or we need to change your treatment, we will call you to review the results.  Testing/Procedures: No testing  Follow-Up: At [facility], you and your health needs are our priority.  As part of our continuing mission to provide you with exceptional heart care, our providers are all part of one team.  This team includes your primary Cardiologist (physician) and Advanced Practice Providers or APPs (Physician Assistants and Nurse Practitioners) who all work together to provide you with the care you need, when you need it.  Your next appointment:   1 year(s)  Provider:   Reatha Harps, MD or Reather Littler, NP   We recommend signing up for the patient portal called "MyChart".  Sign up information is provided on this After Visit Summary.  MyChart is used to connect with patients for Virtual Visits (Telemedicine).  Patients are able to view lab/test results, encounter notes, upcoming appointments, etc.  Non-urgent messages can be sent to your provider as well.   To learn more about what you can do with MyChart, go to [email or web address].   Other Instructions   1st Floor: - Lobby - Registration  - Pharmacy  - Lab - Cafe  2nd Floor: - PV Lab - Diagnostic Testing (echo, CT, nuclear med)  3rd Floor: - Vacant  4th Floor: - TCTS (cardiothoracic surgery) - AFib Clinic - Structural Heart Clinic - Vascular Surgery  - Vascular Ultrasound  5th Floor: - [facility] Cardiology (general and EP) - Clinical Pharmacy for  coumadin, hypertension, lipid, weight-loss medications, and med management appointments    Valet parking services will be available as well.

## 2023-05-03 ENCOUNTER — Encounter: Payer: Self-pay | Admitting: Cardiology

## 2023-05-07 ENCOUNTER — Other Ambulatory Visit: Payer: Self-pay | Admitting: Cardiovascular Disease

## 2023-05-09 LAB — LIPID PANEL
Chol/HDL Ratio: 2.7 ratio (ref 0.0–4.4)
Cholesterol, Total: 135 mg/dL (ref 100–199)
HDL: 50 mg/dL (ref >39)
LDL Chol Calc (NIH): 70 mg/dL (ref 0–99)
Triglycerides: 75 mg/dL (ref 0–149)
VLDL Cholesterol Cal: 15 mg/dL (ref 5–40)

## 2023-05-09 LAB — HEPATIC FUNCTION PANEL
ALT: 50 IU/L — ABNORMAL HIGH (ref 0–32)
AST: 29 IU/L (ref 0–40)
Albumin: 4.5 g/dL (ref 3.8–4.9)
Alkaline Phosphatase: 55 IU/L (ref 44–121)
Bilirubin Total: 0.3 mg/dL (ref 0.0–1.2)
Bilirubin, Direct: 0.13 mg/dL (ref 0.00–0.40)
Total Protein: 8 g/dL (ref 6.0–8.5)

## 2023-05-10 ENCOUNTER — Telehealth: Payer: Self-pay

## 2023-05-10 ENCOUNTER — Inpatient Hospital Stay: Attending: Hematology and Oncology

## 2023-05-10 ENCOUNTER — Other Ambulatory Visit: Payer: Self-pay | Admitting: Hematology and Oncology

## 2023-05-10 DIAGNOSIS — Z79899 Other long term (current) drug therapy: Secondary | ICD-10-CM | POA: Diagnosis not present

## 2023-05-10 DIAGNOSIS — Z811 Family history of alcohol abuse and dependence: Secondary | ICD-10-CM | POA: Insufficient documentation

## 2023-05-10 DIAGNOSIS — D472 Monoclonal gammopathy: Secondary | ICD-10-CM | POA: Diagnosis present

## 2023-05-10 DIAGNOSIS — Z8249 Family history of ischemic heart disease and other diseases of the circulatory system: Secondary | ICD-10-CM | POA: Diagnosis not present

## 2023-05-10 DIAGNOSIS — Z825 Family history of asthma and other chronic lower respiratory diseases: Secondary | ICD-10-CM | POA: Insufficient documentation

## 2023-05-10 DIAGNOSIS — Z803 Family history of malignant neoplasm of breast: Secondary | ICD-10-CM | POA: Insufficient documentation

## 2023-05-10 DIAGNOSIS — Z833 Family history of diabetes mellitus: Secondary | ICD-10-CM | POA: Insufficient documentation

## 2023-05-10 LAB — CMP (CANCER CENTER ONLY)
ALT: 42 U/L (ref 0–44)
AST: 29 U/L (ref 15–41)
Albumin: 4.5 g/dL (ref 3.5–5.0)
Alkaline Phosphatase: 46 U/L (ref 38–126)
Anion gap: 5 (ref 5–15)
BUN: 16 mg/dL (ref 6–20)
CO2: 28 mmol/L (ref 22–32)
Calcium: 10.1 mg/dL (ref 8.9–10.3)
Chloride: 105 mmol/L (ref 98–111)
Creatinine: 1 mg/dL (ref 0.44–1.00)
GFR, Estimated: >60 mL/min (ref >60)
Glucose, Bld: 90 mg/dL (ref 70–99)
Potassium: 4.2 mmol/L (ref 3.5–5.1)
Sodium: 138 mmol/L (ref 135–145)
Total Bilirubin: 0.5 mg/dL (ref 0.0–1.2)
Total Protein: 8.3 g/dL — ABNORMAL HIGH (ref 6.5–8.1)

## 2023-05-10 LAB — CBC WITH DIFFERENTIAL (CANCER CENTER ONLY)
Abs Immature Granulocytes: 0.02 10*3/uL (ref 0.00–0.07)
Basophils Absolute: 0.1 10*3/uL (ref 0.0–0.1)
Basophils Relative: 1 %
Eosinophils Absolute: 0.3 10*3/uL (ref 0.0–0.5)
Eosinophils Relative: 4 %
HCT: 42.7 % (ref 36.0–46.0)
Hemoglobin: 14.3 g/dL (ref 12.0–15.0)
Immature Granulocytes: 0 %
Lymphocytes Relative: 31 %
Lymphs Abs: 2.9 10*3/uL (ref 0.7–4.0)
MCH: 31.1 pg (ref 26.0–34.0)
MCHC: 33.5 g/dL (ref 30.0–36.0)
MCV: 92.8 fL (ref 80.0–100.0)
Monocytes Absolute: 1 10*3/uL (ref 0.1–1.0)
Monocytes Relative: 11 %
Neutro Abs: 4.9 10*3/uL (ref 1.7–7.7)
Neutrophils Relative %: 53 %
Platelet Count: 315 10*3/uL (ref 150–400)
RBC: 4.6 MIL/uL (ref 3.87–5.11)
RDW: 13.8 % (ref 11.5–15.5)
WBC Count: 9.1 10*3/uL (ref 4.0–10.5)
nRBC: 0 % (ref 0.0–0.2)

## 2023-05-10 LAB — RETIC PANEL
Immature Retic Fract: 9.1 % (ref 2.3–15.9)
RBC.: 4.58 MIL/uL (ref 3.87–5.11)
Retic Count, Absolute: 62.7 10*3/uL (ref 19.0–186.0)
Retic Ct Pct: 1.4 % (ref 0.4–3.1)
Reticulocyte Hemoglobin: 36 pg (ref >27.9)

## 2023-05-10 LAB — IRON AND IRON BINDING CAPACITY (CC-WL,HP ONLY)
Iron: 114 ug/dL (ref 28–170)
Saturation Ratios: 38 % — ABNORMAL HIGH (ref 10.4–31.8)
TIBC: 297 ug/dL (ref 250–450)
UIBC: 183 ug/dL (ref 148–442)

## 2023-05-10 LAB — FERRITIN: Ferritin: 64 ng/mL (ref 11–307)

## 2023-05-10 LAB — LACTATE DEHYDROGENASE: LDH: 156 U/L (ref 98–192)

## 2023-05-10 NOTE — Telephone Encounter (Signed)
 Called patient advised of below they verbalized understanding Patient ALT from [facility] came back normal do not need to reorder

## 2023-05-10 NOTE — Telephone Encounter (Signed)
-----   Message from Katlyn D West sent at 05/10/2023  7:41 AM EDT ----- Please let Gwendolyn Ramos know that her LDL is at goal of 70. Her AST, one of the indicators of liver function is normal, her ALT, the other indicator is slightly elevated. Lets plan to recheck LFTs in one month for monitoring. Overall good results!

## 2023-05-11 LAB — KAPPA/LAMBDA LIGHT CHAINS
Kappa free light chain: 49.3 mg/L — ABNORMAL HIGH (ref 3.3–19.4)
Kappa, lambda light chain ratio: 4.98 — ABNORMAL HIGH (ref 0.26–1.65)
Lambda free light chains: 9.9 mg/L (ref 5.7–26.3)

## 2023-05-12 LAB — MULTIPLE MYELOMA PANEL, SERUM
Albumin SerPl Elph-Mcnc: 3.9 g/dL (ref 2.9–4.4)
Albumin/Glob SerPl: 1.1 (ref 0.7–1.7)
Alpha 1: 0.2 g/dL (ref 0.0–0.4)
Alpha2 Glob SerPl Elph-Mcnc: 0.7 g/dL (ref 0.4–1.0)
B-Globulin SerPl Elph-Mcnc: 0.9 g/dL (ref 0.7–1.3)
Gamma Glob SerPl Elph-Mcnc: 2.1 g/dL — ABNORMAL HIGH (ref 0.4–1.8)
Globulin, Total: 3.9 g/dL (ref 2.2–3.9)
IgA: 50 mg/dL — ABNORMAL LOW (ref 87–352)
IgG (Immunoglobin G), Serum: 2383 mg/dL — ABNORMAL HIGH (ref 586–1602)
IgM (Immunoglobulin M), Srm: 93 mg/dL (ref 26–217)
M Protein SerPl Elph-Mcnc: 1.5 g/dL — ABNORMAL HIGH
Total Protein ELP: 7.8 g/dL (ref 6.0–8.5)

## 2023-05-17 ENCOUNTER — Other Ambulatory Visit: Payer: Self-pay | Admitting: Cardiovascular Disease

## 2023-05-17 ENCOUNTER — Other Ambulatory Visit: Payer: BC Managed Care – PPO

## 2023-05-17 NOTE — Progress Notes (Unsigned)
 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 Rosaline Coma. Ferritin 75.   # IgG Kappa MGUS 06/30/2021: establish care with Dr. Rosaline Coma  06/30/2021: Kappa 55.7, Lambda 8.6, Ratio 6.48. 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   Interval History:  Rayonna Heldman 58 y.o. female with medical history significant for MGUS and hereditary hemochromatosis who presents for a follow up visit. The patient's last visit was on 12/14/2022. In the interim since the last visit she has had no major changes in her health.  On exam today Mrs. Hollenkamp notes everything has been fine in the interim her last visit.  She has had no changes in her health such as hospitalizations, ER visits, or new medications.  She reports that she did have norovirus back in December and had a lot of diarrhea but no vomiting.  She reports she is not have the flu.  She did not get her vaccines this year.  She reports her appetite and energy have been good and she is not having any bone pain.  She reports she does not check her urine is unsure if it is bubbling, foaming, or having changes in color.  She reports she was having some trouble with constipation on Ozempic now her bowels are moving more regularly.  She reports nothing else has been out of the ordinary.  She denies any fevers, chills, sweats, nausea, vomiting or diarrhea.  Full 10 point ROS is otherwise negative.  MEDICAL HISTORY:  Past Medical History:  Diagnosis Date   Anxiety    COVID-19 08/2020    mild symptoms, no treatment, all symptoms resolved as of 10/21/20 per patient   Depression    Eczema of hand    both hands   GERD (gastroesophageal reflux disease)    Hemochromatosis associated with compound heterozygous mutation in HFE gene (HCC)    followed by dr Charolett Copes (oncology/ hemotology)  hereditary ,  pt was tested due to her father had the mutation   History of toxic shock syndrome    age 55   HSV-1 infection    IBS (irritable bowel syndrome)    Lichen sclerosus    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 10/21/20    SURGICAL HISTORY: Past Surgical History:  Procedure Laterality Date   BLADDER SUSPENSION N/A 10/26/2020   Procedure: TRANSVAGINAL TAPE (TVT) PROCEDURE;  Surgeon: Arma Lamp, MD;  Location: [facility];  Service: Gynecology;  Laterality: N/A;   BREAST REDUCTION SURGERY Bilateral 01/13/2022   wtih abdominoplasty   BUNIONECTOMY Bilateral    CHOLECYSTECTOMY  2001   CYSTOSCOPY N/A 10/26/2020   Procedure: CYSTOSCOPY;  Surgeon: Arma Lamp, MD;  Location: [facility];  Service: Gynecology;  Laterality: N/A;   NASAL SEPTUM SURGERY     RECTOCELE REPAIR N/A 10/26/2020   Procedure: POSTERIOR REPAIR (RECTOCELE) with perineorrhaphy;  Surgeon: Arma Lamp, MD;  Location: [facility];  Service: Gynecology;  Laterality: N/A;  total time requested for all procedures is  1.5 hours   REDUCTION MAMMAPLASTY     TONSILLECTOMY      SOCIAL HISTORY: Social History   Socioeconomic History   Marital status: Married    Spouse name: Not on file   Number of children: 2   Years of education: Not on file   Highest education level: Not on file  Occupational History   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  Tobacco Use   Smoking status: Never   Smokeless tobacco: Never  Vaping Use   Vaping status: Never Used  Substance and  Sexual Activity   Alcohol use: Yes    Alcohol/week: 3.0 standard drinks of alcohol    Types: 3 Glasses of wine per week   Drug use: Never   Sexual activity: Not Currently    Birth control/protection: None  Other Topics Concern   Not on file  Social History Narrative   Right handed    Two story home   Drinks caffeine   Social Drivers of Corporate investment banker Strain: Not on file  Food Insecurity: Not on file  Transportation Needs: Not on file  Physical Activity: Not on file  Stress: Not on file  Social Connections: Not on file  Intimate Partner Violence: Not on file    FAMILY HISTORY: Family History  Problem Relation Age of Onset   Diabetes Mother    COPD Mother    Congestive Heart Failure Mother    Thyroid disease Mother    Hemochromatosis Father    Alcoholism Father    Breast cancer Sister 89       invasive lobular breast cancer, negative genetic testing   Diabetes Sister    Asthma Sister    Asthma Brother    Diabetes Maternal Aunt    Breast cancer Maternal Aunt    Heart attack Maternal Grandfather    Diabetes Other     ALLERGIES:  is allergic to levaquin [levofloxacin], methylchloroisothiazolinone [methylisothiazolinone], and keflex  [cephalexin ].  MEDICATIONS:  Current Outpatient Medications  Medication Sig Dispense Refill   Ascorbic Acid (VITAMIN C) 500 MG CAPS as directed Orally twice a day     betamethasone dipropionate 0.05 % cream Apply topically 2 (two) times daily.     buPROPion (WELLBUTRIN SR) 150 MG 12 hr tablet Take 150 mg by mouth 2 (two) times daily.     Cholecalciferol (VITAMIN D -3 PO) Take 1 tablet by mouth daily.     co-enzyme Q-10 30 MG capsule Take 30 mg by mouth 3 (three) times daily.     Erenumab -aooe (AIMOVIG ) 140 MG/ML SOAJ Inject 140 mg into the skin every 28 (twenty-eight) days. 1.12 mL 11   estradiol  (ESTRING ) 7.5 MCG/24HR vaginal ring Place 1 each vaginally every 3 (three) months. 1 each 3   Estradiol  (VAGIFEM ) 10 MCG TABS  vaginal tablet Place 1 tablet (10 mcg total) vaginally 2 (two) times a week. (Patient not taking: Reported on 05/01/2023) 24 tablet 3   Multiple Vitamins-Minerals (MULTIVITAMIN WITH MINERALS) tablet Take 1 tablet by mouth daily.     Omega-3 Fatty Acids (FISH OIL) 1000 MG CAPS Take by mouth.     omeprazole (PRILOSEC) 20 MG capsule Take 20 mg by mouth daily.     OVER THE COUNTER MEDICATION Magnesium 325mg -Take 2 capsule by mouth daily.     OZEMPIC, 0.25 OR 0.5 MG/DOSE, 2 MG/3ML SOPN      Probiotic Product (PROBIOTIC-10 PO) Take by mouth.     rosuvastatin  (CRESTOR ) 10 MG tablet TAKE 1 TABLET(10 MG) BY MOUTH DAILY 90  tablet 3   rosuvastatin  (CRESTOR ) 10 MG tablet Take 1 tablet (10 mg total) by mouth daily. 30 tablet 0   triamcinolone  cream (KENALOG) 0.1 % Apply topically 2 (two) times daily.     Ubrogepant  (UBRELVY ) 100 MG TABS Take 1 tablet (100 mg total) by mouth as needed (May repeat in 2 hours if needed.  Maximum 2 tablets in 24 hours.). 10 tablet 11   valACYclovir  (VALTREX ) 500 MG tablet Take 1 tablet (500 mg total) by mouth daily. 90 tablet 4   No current facility-administered medications for this visit.    REVIEW OF SYSTEMS:   Constitutional: ( - ) fevers, ( - )  chills , ( - ) night sweats Eyes: ( - ) blurriness of vision, ( - ) double vision, ( - ) watery eyes Ears, nose, mouth, throat, and face: ( - ) mucositis, ( - ) sore throat Respiratory: ( - ) cough, ( - ) dyspnea, ( - ) wheezes Cardiovascular: ( - ) palpitation, ( - ) chest discomfort, ( - ) lower extremity swelling Gastrointestinal:  ( - ) nausea, ( - ) heartburn, ( - ) change in bowel habits Skin: ( - ) abnormal skin rashes Lymphatics: ( - ) new lymphadenopathy, ( - ) easy bruising Neurological: ( - ) numbness, ( - ) tingling, ( - ) new weaknesses Behavioral/Psych: ( - ) mood change, ( - ) new changes  All other systems were reviewed with the patient and are negative.  PHYSICAL EXAMINATION:  Vitals:   05/18/23 1033  BP:  121/81  Pulse: 71  Resp: 13  Temp: 97.9 F (36.6 C)  SpO2: 97%     Filed Weights   05/18/23 1033  Weight: 200 lb 1.6 oz (90.8 kg)      GENERAL: Well-appearing middle-age Caucasian female, alert, no distress and comfortable SKIN: skin color, texture, turgor are normal, no rashes or significant lesions EYES: conjunctiva are pink and non-injected, sclera clear LUNGS: clear to auscultation and percussion with normal breathing effort HEART: regular rate & rhythm and no murmurs and no lower extremity edema Musculoskeletal: no cyanosis of digits and no clubbing  PSYCH: alert & oriented x 3, fluent speech NEURO: no focal motor/sensory deficits  LABORATORY DATA:  I have reviewed the data as listed    Latest Ref Rng & Units 05/10/2023    8:10 AM 12/06/2022    7:58 AM 11/22/2022    8:58 AM  CBC  WBC 4.0 - 10.5 K/uL 9.1  9.0  8.8   Hemoglobin 12.0 - 15.0 g/dL 16.1  09.6  04.5   Hematocrit 36.0 - 46.0 % 42.7  44.0  42.8   Platelets 150 - 400 K/uL 315  321  324.0        Latest Ref Rng & Units 05/10/2023    8:10 AM 05/09/2023    8:20 AM 12/06/2022    7:58 AM  CMP  Glucose 70 - 99 mg/dL 90   92   BUN 6 - 20 mg/dL 16   15   Creatinine 4.09 - 1.00 mg/dL 8.11   9.14   Sodium 782 - 145 mmol/L 138   139   Potassium 3.5 - 5.1 mmol/L 4.2   4.1   Chloride 98 - 111 mmol/L 105   104   CO2 22 - 32 mmol/L 28   28   Calcium  8.9 - 10.3 mg/dL 95.6   21.3   Total Protein 6.5 - 8.1 g/dL 8.3  8.0  8.4  Total Bilirubin 0.0 - 1.2 mg/dL 0.5  0.3  0.5   Alkaline Phos 38 - 126 U/L 46  55  53   AST 15 - 41 U/L 29  29  23    ALT 0 - 44 U/L 42  50  28     Lab Results  Component Value Date   MPROTEIN 1.5 (H) 05/10/2023   MPROTEIN 1.5 (H) 12/06/2022   MPROTEIN 1.7 (H) 06/07/2022   Lab Results  Component Value Date   KPAFRELGTCHN 49.3 (H) 05/10/2023   KPAFRELGTCHN 48.1 (H) 12/06/2022   KPAFRELGTCHN 54.7 (H) 06/07/2022   LAMBDASER 9.9 05/10/2023   LAMBDASER 7.7 12/06/2022   LAMBDASER 10.1  06/07/2022   KAPLAMBRATIO 4.98 (H) 05/10/2023   KAPLAMBRATIO 6.25 (H) 12/06/2022   KAPLAMBRATIO >13.52 06/09/2022    RADIOGRAPHIC STUDIES: No results found.  ASSESSMENT & PLAN Kealohilani Maiorino 58 y.o. female with medical history significant for MGUS and hereditary hemochromatosis who presents for a follow up visit.  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  Patient is never required phlebotomy --Compound heterozygous rarely develop overload of iron. --ferritin 64 on 05/10/2023 --Continue to monitor  # IgG Kappa MGUS --Labs showed white blood cell count 9.1, hemoglobin 14.3, MCV 92.8, platelets 315 with M protein stable at 1.5. --recommend a metastatic bone survey yearly to assess for lytic lesions. UPEP ordered last week, results pending.  --bone marrow biopsy performed on 08/05/2021 showed 9% plasma cells by manual count, consistent with MGUS.  --Return to clinic in 6 months for labs in 12 months for clinic visit with labs 1 week before.  No orders of the defined types were placed in this encounter.   All questions were answered. The patient knows to call the clinic with any problems, questions or concerns.  A total of more than 30 minutes were spent on this encounter with face-to-face time and non-face-to-face time, including preparing to see the patient, ordering tests and/or medications, counseling the patient and coordination of care as outlined above.   Rogerio Clay, MD Department of Hematology/Oncology [facility] at [facility] Phone: [phone] Pager: [phone] Email: [email or web address]  05/18/2023 11:00 AM

## 2023-05-18 ENCOUNTER — Inpatient Hospital Stay (HOSPITAL_BASED_OUTPATIENT_CLINIC_OR_DEPARTMENT_OTHER): Admitting: Hematology and Oncology

## 2023-05-18 VITALS — BP 121/81 | HR 71 | Temp 97.9°F | Resp 13 | Wt 200.1 lb

## 2023-05-18 DIAGNOSIS — D472 Monoclonal gammopathy: Secondary | ICD-10-CM | POA: Diagnosis not present

## 2023-05-24 ENCOUNTER — Ambulatory Visit: Payer: BC Managed Care – PPO | Admitting: Hematology and Oncology

## 2023-06-05 ENCOUNTER — Encounter (HOSPITAL_COMMUNITY): Payer: Self-pay

## 2023-06-12 ENCOUNTER — Ambulatory Visit
Admission: RE | Admit: 2023-06-12 | Discharge: 2023-06-12 | Disposition: A | Discharge status: Home / Self Care | Source: Ambulatory Visit | Attending: Obstetrics & Gynecology | Admitting: Obstetrics & Gynecology

## 2023-06-12 ENCOUNTER — Other Ambulatory Visit (HOSPITAL_COMMUNITY)
Admission: RE | Admit: 2023-06-12 | Discharge: 2023-06-12 | Disposition: A | Discharge status: Home / Self Care | Source: Ambulatory Visit | Attending: Obstetrics & Gynecology | Admitting: Obstetrics & Gynecology

## 2023-06-12 ENCOUNTER — Encounter (HOSPITAL_BASED_OUTPATIENT_CLINIC_OR_DEPARTMENT_OTHER): Payer: Self-pay | Admitting: Obstetrics & Gynecology

## 2023-06-12 ENCOUNTER — Ambulatory Visit (HOSPITAL_BASED_OUTPATIENT_CLINIC_OR_DEPARTMENT_OTHER): Payer: BC Managed Care – PPO | Admitting: Obstetrics & Gynecology

## 2023-06-12 VITALS — BP 123/81 | HR 66 | Ht 68.0 in | Wt 204.0 lb

## 2023-06-12 DIAGNOSIS — Z124 Encounter for screening for malignant neoplasm of cervix: Secondary | ICD-10-CM

## 2023-06-12 DIAGNOSIS — R0683 Snoring: Secondary | ICD-10-CM | POA: Diagnosis not present

## 2023-06-12 DIAGNOSIS — N644 Mastodynia: Secondary | ICD-10-CM

## 2023-06-12 DIAGNOSIS — R921 Mammographic calcification found on diagnostic imaging of breast: Secondary | ICD-10-CM

## 2023-06-12 DIAGNOSIS — N930 Postcoital and contact bleeding: Secondary | ICD-10-CM | POA: Diagnosis present

## 2023-06-12 NOTE — Progress Notes (Signed)
 GYNECOLOGY  VISIT  CC:   post coital bleeding  HPI: 58 y.o. G44P2012 Married White or Caucasian female here for follow up due to post coital bleeding.  This only occurs with intercourse.  Bleeding can be bright red.  She does have some pain with intercourse.  Has used Estring  and this needs to be removed today.  Not sure this has done anything.  Would like to consider topical estrogen or oral estrogen therapy.  She is followed by cardiology, neurology and oncology.  Discussed risks and benefits.  Would prefer to use vaginal method if possible.  Had diagnostic mammogram done today that was scheduled for follow up.  She has been having some left breast pain/chest wall pain that has been present for about a month.  Was advised additional imaging may be needed and to discuss with me.  Having snoring issues and fatigue.  Also wonders if HRT would help this.  Has been recommended to have sleep study but hasn't been scheduled.  Will refer for this.     Past Medical History:  Diagnosis Date   Anxiety    COVID-19 08/2020   mild symptoms, no treatment, all symptoms resolved as of 10/21/20 per patient   Depression    Eczema of hand    both hands   GERD (gastroesophageal reflux disease)    Hemochromatosis associated with compound heterozygous mutation in HFE gene (HCC)    followed by dr Charolett Copes (oncology/ hemotology)  hereditary ,  pt was tested due to her father had the mutation   History of toxic shock syndrome    age 36   HSV-1 infection    IBS (irritable bowel syndrome)    Lichen sclerosus    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 10/21/20    MEDS:   Current Outpatient Medications on File Prior to Visit  Medication Sig Dispense Refill   Ascorbic Acid (VITAMIN C) 500 MG CAPS as directed Orally twice a day     betamethasone dipropionate 0.05 % cream Apply topically 2 (two) times daily.      buPROPion (WELLBUTRIN SR) 150 MG 12 hr tablet Take 150 mg by mouth 2 (two) times daily.     Cholecalciferol (VITAMIN D -3 PO) Take 1 tablet by mouth daily.     co-enzyme Q-10 30 MG capsule Take 30 mg by mouth 3 (three) times daily.     Erenumab -aooe (AIMOVIG ) 140 MG/ML SOAJ Inject 140 mg into the skin every 28 (twenty-eight) days. 1.12 mL 11   estradiol  (ESTRING ) 7.5 MCG/24HR vaginal ring Place 1 each vaginally every 3 (three) months. 1 each 3   Estradiol  (VAGIFEM ) 10 MCG TABS vaginal tablet Place 1 tablet (10 mcg total) vaginally 2 (two) times a week. 24 tablet 3   Multiple Vitamins-Minerals (MULTIVITAMIN WITH MINERALS) tablet Take 1 tablet by mouth daily.     Omega-3 Fatty Acids (FISH OIL) 1000 MG CAPS Take by mouth.     omeprazole (PRILOSEC) 20 MG capsule Take 20 mg by mouth daily.     OVER THE COUNTER MEDICATION Magnesium 325mg -Take 2 capsule by mouth daily.     OZEMPIC, 0.25 OR 0.5 MG/DOSE, 2 MG/3ML SOPN      Probiotic Product (PROBIOTIC-10 PO) Take by mouth.     rosuvastatin  (CRESTOR ) 10 MG tablet TAKE 1 TABLET(10 MG) BY MOUTH DAILY 90 tablet 3   triamcinolone  cream (KENALOG) 0.1 % Apply  topically 2 (two) times daily.     Ubrogepant  (UBRELVY ) 100 MG TABS Take 1 tablet (100 mg total) by mouth as needed (May repeat in 2 hours if needed.  Maximum 2 tablets in 24 hours.). 10 tablet 11   valACYclovir  (VALTREX ) 500 MG tablet Take 1 tablet (500 mg total) by mouth daily. 90 tablet 4   No current facility-administered medications on file prior to visit.    ALLERGIES: Levaquin [levofloxacin], Methylchloroisothiazolinone [methylisothiazolinone], and Keflex  [cephalexin ]  SH:  married, non smoker  Review of Systems  Constitutional: Negative.   Genitourinary: Negative.     PHYSICAL EXAMINATION:    BP 123/81 (BP Location: Left Arm, Patient Position: Sitting, Cuff Size: Normal)   Pulse 66   Ht 5\' 8"  (1.727 m)   Wt 204 lb (92.5 kg)   LMP  (LMP Unknown) Comment: patient had spotting in October  2020  BMI 31.02 kg/m     General appearance: alert, cooperative and appears stated age  Lymph:  no inguinal LAD noted Pelvic: External genitalia:  no lesions              Urethra:  normal appearing urethra with no masses, tenderness or lesions              Bartholins and Skenes: normal                 Vagina: atrophy noted, Estring  removed              Cervix: no lesions and pap obtained and bleeding noted              Bimanual Exam:  Uterus:  normal size, contour, position, consistency, mobility, non-tender              Adnexa: no mass, fullness, tenderness   Chaperone, Myrtie Atkinson, CMA, was present for exam.  Assessment/Plan: 1. PCB (post coital bleeding) (Primary) - will obtain pap today and have pt return for ultrasound to visualize endometrium and cervix, in particular - Cytology - PAP( [place]) - US  PELVIS TRANSVAGINAL NON-OB (TV ONLY); Future - if this is all negative, will consider additional vaginal estrogen options.  Will need to check with specialists prior to any oral/transdermal estrogens  2. Cervical cancer screening  3. Snoring - Ambulatory referral to Neurology  4. Breast pain - given no u/s, would recommend proceeding with this.  Will reach out to radiology to ensure no additional orders needed. - US  LIMITED ULTRASOUND INCLUDING AXILLA RIGHT BREAST; Future  Total time with pt:  31 minutes Documentation: 8 minutes Total time:  39 minutes

## 2023-06-13 ENCOUNTER — Other Ambulatory Visit (HOSPITAL_BASED_OUTPATIENT_CLINIC_OR_DEPARTMENT_OTHER): Payer: Self-pay | Admitting: Obstetrics & Gynecology

## 2023-06-13 DIAGNOSIS — N644 Mastodynia: Secondary | ICD-10-CM

## 2023-06-14 ENCOUNTER — Encounter (HOSPITAL_BASED_OUTPATIENT_CLINIC_OR_DEPARTMENT_OTHER): Payer: Self-pay | Admitting: Obstetrics & Gynecology

## 2023-06-14 ENCOUNTER — Ambulatory Visit (HOSPITAL_BASED_OUTPATIENT_CLINIC_OR_DEPARTMENT_OTHER)

## 2023-06-14 ENCOUNTER — Ambulatory Visit (HOSPITAL_BASED_OUTPATIENT_CLINIC_OR_DEPARTMENT_OTHER): Admitting: Obstetrics & Gynecology

## 2023-06-14 ENCOUNTER — Ambulatory Visit: Payer: BC Managed Care – PPO | Admitting: Neurology

## 2023-06-14 ENCOUNTER — Ambulatory Visit (HOSPITAL_BASED_OUTPATIENT_CLINIC_OR_DEPARTMENT_OTHER): Payer: Self-pay | Admitting: Obstetrics & Gynecology

## 2023-06-14 VITALS — BP 131/74 | HR 80 | Wt 204.6 lb

## 2023-06-14 DIAGNOSIS — N952 Postmenopausal atrophic vaginitis: Secondary | ICD-10-CM | POA: Diagnosis not present

## 2023-06-14 DIAGNOSIS — N930 Postcoital and contact bleeding: Secondary | ICD-10-CM

## 2023-06-14 LAB — CYTOLOGY - PAP: Diagnosis: NEGATIVE

## 2023-06-17 NOTE — Progress Notes (Signed)
 GYNECOLOGY  VISIT  CC:   Discuss ultrasound results  HPI: 58 y.o. G53P2012 Married White or Caucasian female here for discussion of ultrasound results.  Ultrasound obtained due to post coital bleeding.  Exam showed continue vaginal atrophy despite treatment with vaginal Estring  for 3 months.  Pap smear did show normal findings with severe inflammation.    Ultrasound today showed uterus measuring about 5 x 5 x 4cm.  Endometrium 3.32mm. Ovaries normal.    She does have some interest in trying HRT.  Will communicated with neurology, cardiology and oncology before starting.  For now, could use vaginal suppositories for 3 months as I feel there are more risks for her with oral or transdermal therapy.  Pt voices understanding.   Past Medical History:  Diagnosis Date   Anxiety    COVID-19 08/2020   mild symptoms, no treatment, all symptoms resolved as of 10/21/20 per patient   Depression    Eczema of hand    both hands   GERD (gastroesophageal reflux disease)    Hemochromatosis associated with compound heterozygous mutation in HFE gene (HCC)    followed by dr Charolett Copes (oncology/ hemotology)  hereditary ,  pt was tested due to her father had the mutation   History of toxic shock syndrome    age 42   HSV-1 infection    IBS (irritable bowel syndrome)    Lichen sclerosus    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 10/21/20    MEDS:   Current Outpatient Medications on File Prior to Visit  Medication Sig Dispense Refill   Ascorbic Acid (VITAMIN C) 500 MG CAPS as directed Orally twice a day     betamethasone dipropionate 0.05 % cream Apply topically 2 (two) times daily.     buPROPion (WELLBUTRIN SR) 150 MG 12 hr tablet Take 150 mg by mouth 2 (two) times daily.     Cholecalciferol (VITAMIN D -3 PO) Take 1 tablet by mouth daily.     co-enzyme Q-10 30 MG capsule Take 30 mg by mouth 3 (three) times daily.      Erenumab -aooe (AIMOVIG ) 140 MG/ML SOAJ Inject 140 mg into the skin every 28 (twenty-eight) days. 1.12 mL 11   estradiol  (ESTRING ) 7.5 MCG/24HR vaginal ring Place 1 each vaginally every 3 (three) months. 1 each 3   Estradiol  (VAGIFEM ) 10 MCG TABS vaginal tablet Place 1 tablet (10 mcg total) vaginally 2 (two) times a week. 24 tablet 3   Multiple Vitamins-Minerals (MULTIVITAMIN WITH MINERALS) tablet Take 1 tablet by mouth daily.     Omega-3 Fatty Acids (FISH OIL) 1000 MG CAPS Take by mouth.     omeprazole (PRILOSEC) 20 MG capsule Take 20 mg by mouth daily.     OVER THE COUNTER MEDICATION Magnesium 325mg -Take 2 capsule by mouth daily.     OZEMPIC, 0.25 OR 0.5 MG/DOSE, 2 MG/3ML SOPN      Probiotic Product (PROBIOTIC-10 PO) Take by mouth.     rosuvastatin  (CRESTOR ) 10 MG tablet TAKE 1 TABLET(10 MG) BY MOUTH DAILY 90 tablet 3   triamcinolone  cream (KENALOG) 0.1 % Apply topically 2 (two) times daily.     Ubrogepant  (UBRELVY ) 100 MG TABS Take 1 tablet (100 mg total) by mouth as needed (May repeat in 2 hours if needed.  Maximum 2 tablets in 24 hours.). 10 tablet 11   valACYclovir  (VALTREX ) 500 MG tablet Take 1 tablet (500  mg total) by mouth daily. 90 tablet 4   No current facility-administered medications on file prior to visit.    ALLERGIES: Levaquin [levofloxacin], Methylchloroisothiazolinone [methylisothiazolinone], and Keflex  [cephalexin ]  SH:  married, non smoker  Review of Systems  Constitutional: Negative.   Genitourinary:        Postcoital bleeding    PHYSICAL EXAMINATION:    BP 131/74 (BP Location: Right Arm, Patient Position: Sitting, Cuff Size: Large)   Pulse 80   Wt 204 lb 9.6 oz (92.8 kg)   LMP  (LMP Unknown) Comment: patient had spotting in October 2020  BMI 31.11 kg/m      Physical Exam Constitutional:      Appearance: Normal appearance.  Neurological:     General: No focal deficit present.     Mental Status: She is alert.  Psychiatric:        Mood and Affect: Mood  normal.     Assessment/Plan: 1. Postcoital bleeding (Primary) - pap normal.  HR HPV negative. - ultrasound does not show any abnormalities.  Cervix appeared normal as well.  2. Vaginal atrophy - will send rx for compounded vaginal suppositories    - will communicated with her cardiologist, oncologist and hematologist for input before deciding about oral or transdermal treatment

## 2023-06-22 ENCOUNTER — Ambulatory Visit
Admission: RE | Admit: 2023-06-22 | Discharge: 2023-06-22 | Discharge status: Home / Self Care | Source: Ambulatory Visit | Attending: Obstetrics & Gynecology | Admitting: Obstetrics & Gynecology

## 2023-06-22 ENCOUNTER — Other Ambulatory Visit (HOSPITAL_BASED_OUTPATIENT_CLINIC_OR_DEPARTMENT_OTHER): Payer: Self-pay | Admitting: Obstetrics & Gynecology

## 2023-06-22 ENCOUNTER — Ambulatory Visit
Admission: RE | Admit: 2023-06-22 | Discharge: 2023-06-22 | Disposition: A | Discharge status: Home / Self Care | Source: Ambulatory Visit | Attending: Obstetrics & Gynecology | Admitting: Obstetrics & Gynecology

## 2023-06-22 DIAGNOSIS — N644 Mastodynia: Secondary | ICD-10-CM

## 2023-06-22 DIAGNOSIS — N63 Unspecified lump in unspecified breast: Secondary | ICD-10-CM

## 2023-06-23 ENCOUNTER — Telehealth: Payer: Self-pay | Admitting: Pharmacy Technician

## 2023-06-23 ENCOUNTER — Other Ambulatory Visit (HOSPITAL_COMMUNITY): Payer: Self-pay

## 2023-06-23 NOTE — Telephone Encounter (Signed)
 Pharmacy Patient Advocate Encounter   Received notification from CoverMyMeds that prior authorization for UBRELVY  100MG  is required/requested.   Insurance verification completed.   The patient is insured through [facility] .   Per test claim: PA required; PA submitted to above mentioned insurance via CoverMyMeds Key/confirmation #/EOC [id number] Status is pending

## 2023-06-26 ENCOUNTER — Other Ambulatory Visit (HOSPITAL_BASED_OUTPATIENT_CLINIC_OR_DEPARTMENT_OTHER): Payer: Self-pay | Admitting: Obstetrics & Gynecology

## 2023-06-26 DIAGNOSIS — N952 Postmenopausal atrophic vaginitis: Secondary | ICD-10-CM

## 2023-06-26 MED ORDER — NONFORMULARY OR COMPOUNDED ITEM: 3 refills | Status: DC

## 2023-07-02 ENCOUNTER — Encounter (HOSPITAL_BASED_OUTPATIENT_CLINIC_OR_DEPARTMENT_OTHER): Payer: Self-pay | Admitting: Obstetrics & Gynecology

## 2023-07-11 ENCOUNTER — Other Ambulatory Visit: Payer: Self-pay | Admitting: Neurology

## 2023-08-08 ENCOUNTER — Other Ambulatory Visit: Payer: Self-pay | Admitting: Neurology

## 2023-08-21 ENCOUNTER — Other Ambulatory Visit (HOSPITAL_BASED_OUTPATIENT_CLINIC_OR_DEPARTMENT_OTHER): Payer: Self-pay | Admitting: Obstetrics & Gynecology

## 2023-08-21 DIAGNOSIS — Z9229 Personal history of other drug therapy: Secondary | ICD-10-CM

## 2023-08-21 MED ORDER — PROGESTERONE 200 MG PO CAPS: ORAL_CAPSULE | ORAL | 1 refills | Status: DC

## 2023-08-21 MED ORDER — ESTRADIOL 0.05 MG/24HR TD PTTW: 1.0000 | MEDICATED_PATCH | TRANSDERMAL | 2 refills | Status: DC

## 2023-08-21 NOTE — Progress Notes (Unsigned)
 NEUROLOGY FOLLOW UP OFFICE NOTE  Gwendolyn Ramos [id number]  Assessment/Plan:    Migraine without aura, without status migrainosus, not intractable     1.  Migraine prevention: Aimovig  140mg  every 30 days  2.  Migraine rescue:  Ubrelvy100mg    3.  Limit use of pain relievers to no more than 9 days out of the month to prevent risk of rebound or medication-overuse headache. 4.  Keep headache diary 5.  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   UPDATE: Doing well Migraines occur 1 to 2 days a month, lasts 30 minutes with Ubrelvy  (occasionally takes second dose) Mild headaches occur 1 or less a month.  Brief with Aleve or Ubrelvy  Often gets them in middle of night.   Dizziness remains stable.  Usually positional, when standing up too fast.  Brief.   Current analgesics/NSAIDs or triptans Current NSAIDS:  none Current analgesics:  none Current triptans:  none Current ergotamine:  none Current anti-emetic:  none Current muscle relaxants:  none Current anti-anxiolytic:  none Current sleep aide:  none Current Antihypertensive medications:  none Current Antidepressant medications:  Wellbutrin Current Anticonvulsant medications:  none Current anti-CGRP:  Aimovig  140mg , Ubrelvy  100mg  Other therapy:  Aroma therapy.  She does receive Botox for cosmetic reasons.   Caffeine:  1 to 2 cups of coffee daily Diet:  At least 64 oz water daily. Does not skip meals Exercise:  Routine.  Walk, golf, tennis Depression:  Yes but stable; Anxiety:  no Other pain:  neck pain improved with Dr. Claudene.  Still with some back pain.     Sleep hygiene:  Had a sleep study.  Has mild sleep apnea.  Planning to use a mouth guard.       HISTORY:  Onset:  High school and college Location:  1.  Diffuse; 2.  Unilateral either side; 3.  Bilateral retro-orbital.  Sometimes associated neck pain. Quality:  1.   Pressure; 2.  Throbbing; 3.  Stabbing Initial Intensity:  1.  Mild-moderate; 2.  Severe; 3.  Severe.  She denies new headache, thunderclap headache or severe headache that wakes her from sleep. Aura:  no Associated symptoms:  Photophobia, phonophobia, osmophobia, dizziness, sometimes blurred vision.  No recent nausea or vomiting.  She denies associated unilateral numbness or weakness. Initial Duration:  Hour to all day Initial Frequency:  Daily headache (severe headaches occur 1 to 2 times a month) Frequency of abortive medication: She pre-treats with Aleve to prevent progression into migraine.  Takes Aleve 4 to 5 days a week. Triggers:  Valsalva, hitting head, menopause; neck pain; perfumes or gasoline, hunger, sugar/sweets Relieving factors:  Aroma therapy; hot shower Activity:  Aggravates.   On 04/17/2021, she had a new type of headache.  She describes a 7/10 searing paroxysmal pain on left greater than right side of head.  She had associated persistent left sided facial numbness as well.  She went to the ED where brain imaging was suggested but she decided to leave.  It lasted an entire day.  She always has a dull head pressure but she had no headache at all for 3 days.  To evaluate new paroxysmal headache and left facial numbness, she had an MRI/MRA of head on 05/19/2021 was unremarkable.      Past NSAIDS:  Advil , Aleve (upsets stomach) Past analgesics:  Tylenol  with codeine; Tylenol  Past abortive triptans:  Imitrex,  Maxalt  Past abortive ergotamine:  none Past muscle relaxants:  Soma; Flexeril Past anti-emetic:  none Past antihypertensive medications:  metoprolol  Past antidepressant medications:  none Past anticonvulsant medications:  topiramate  Past anti-CGRP:  none Past vitamins/Herbal/Supplements:  none Past antihistamines/decongestants:  none Other past therapies:  none     Family history of headache:  Mother (migraines); daughter (migraines)  PAST MEDICAL HISTORY: Past Medical  History:  Diagnosis Date   Anxiety    COVID-19 08/2020   mild symptoms, no treatment, all symptoms resolved as of 10/21/20 per patient   Depression    Eczema of hand    both hands   GERD (gastroesophageal reflux disease)    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 10/21/20    MEDICATIONS: Current Outpatient Medications on File Prior to Visit  Medication Sig Dispense Refill   Ascorbic Acid (VITAMIN C) 500 MG CAPS as directed Orally twice a day     betamethasone dipropionate 0.05 % cream Apply topically 2 (two) times daily.     buPROPion (WELLBUTRIN SR) 150 MG 12 hr tablet Take 150 mg by mouth 2 (two) times daily.     Cholecalciferol (VITAMIN D -3 PO) Take 1 tablet by mouth daily.     co-enzyme Q-10 30 MG capsule Take 30 mg by mouth 3 (three) times daily.     Multiple Vitamins-Minerals (MULTIVITAMIN WITH MINERALS) tablet Take 1 tablet by mouth daily.     Omega-3 Fatty Acids (FISH OIL) 1000 MG CAPS Take by mouth.     omeprazole (PRILOSEC) 20 MG capsule Take 20 mg by mouth daily.     OVER THE COUNTER MEDICATION Magnesium 325mg -Take 2 capsule by mouth daily.     Probiotic Product (PROBIOTIC-10 PO) Take by mouth.     rosuvastatin  (CRESTOR ) 10 MG tablet TAKE 1 TABLET(10 MG) BY MOUTH DAILY 90 tablet 3   triamcinolone  cream (KENALOG) 0.1 % Apply topically 2 (two) times daily.     valACYclovir  (VALTREX ) 500 MG tablet Take 1 tablet (500 mg total) by mouth daily. 90 tablet 4   estradiol  (VIVELLE -DOT) 0.05 MG/24HR patch Place 1 patch (0.05 mg total) onto the skin 2 (two) times a week. (Patient  not taking: Reported on 08/22/2023) 24 patch 2   progesterone  (PROMETRIUM ) 200 MG capsule Take 1 tablet days 1-15 each month. (Patient not taking: Reported on 08/22/2023) 45 capsule 1   No current facility-administered medications on file prior to visit.     ALLERGIES: Allergies  Allergen Reactions   Levaquin [Levofloxacin]     High fever, elevated heart rate   Methylchloroisothiazolinone [Methylisothiazolinone]     In liquid soaps causes skin irritation.   Keflex  [Cephalexin ] Rash    Caused possible rash of arms, HA    FAMILY HISTORY: Family History  Problem Relation Age of Onset   Diabetes Mother    COPD Mother    Congestive Heart Failure Mother    Thyroid disease Mother    Hemochromatosis Father    Alcoholism Father    Breast cancer Sister 27  invasive lobular breast cancer, negative genetic testing   Diabetes Sister    Asthma Sister    Asthma Brother    Diabetes Maternal Aunt    Breast cancer Maternal Aunt    Heart attack Maternal Grandfather    Diabetes Other       Objective:  Blood pressure 125/77, height 5' 9 (1.753 m). General: No acute distress.  Patient appears well-groomed.   Head:  Normocephalic/atraumatic Neck:  Supple.  No paraspinal tenderness.  Full range of motion. Heart:  Regular rate and rhythm. Neuro:  Alert and oriented.  Speech fluent and not dysarthric.  Language intact.  CN II-XII intact.  Bulk and tone normal.  Muscle strength 5/5 throughout.  Sensation to light touch intact.  Deep tendon reflexes 2+ throughout, toes downgoing.  Gait normal.  Romberg negative.    Juliene Dunnings, DO  CC: Isaiah Geralds, MD

## 2023-08-22 ENCOUNTER — Encounter: Payer: Self-pay | Admitting: Neurology

## 2023-08-22 ENCOUNTER — Ambulatory Visit (INDEPENDENT_AMBULATORY_CARE_PROVIDER_SITE_OTHER): Payer: BC Managed Care – PPO | Admitting: Neurology

## 2023-08-22 DIAGNOSIS — G43009 Migraine without aura, not intractable, without status migrainosus: Secondary | ICD-10-CM

## 2023-08-22 MED ORDER — UBRELVY 100 MG PO TABS
1.0000 | ORAL_TABLET | ORAL | 11 refills | Status: AC | PRN
Start: 2023-08-22 — End: ?

## 2023-08-22 MED ORDER — AIMOVIG 140 MG/ML ~~LOC~~ SOAJ: 140.0000 mg | SUBCUTANEOUS | 11 refills | Status: DC

## 2023-08-22 NOTE — Patient Instructions (Signed)
 Aimovig  140mg  every 30 days Ubrelvy  as needed

## 2023-08-23 ENCOUNTER — Ambulatory Visit (HOSPITAL_BASED_OUTPATIENT_CLINIC_OR_DEPARTMENT_OTHER): Admitting: Obstetrics & Gynecology

## 2023-08-23 ENCOUNTER — Other Ambulatory Visit (HOSPITAL_COMMUNITY)
Admission: RE | Admit: 2023-08-23 | Discharge: 2023-08-23 | Disposition: A | Discharge status: Home / Self Care | Source: Ambulatory Visit | Attending: Obstetrics & Gynecology | Admitting: Obstetrics & Gynecology

## 2023-08-23 VITALS — BP 107/73 | HR 81 | Wt 205.0 lb

## 2023-08-23 DIAGNOSIS — N930 Postcoital and contact bleeding: Secondary | ICD-10-CM | POA: Diagnosis not present

## 2023-08-23 DIAGNOSIS — N898 Other specified noninflammatory disorders of vagina: Secondary | ICD-10-CM | POA: Diagnosis present

## 2023-08-24 LAB — CERVICOVAGINAL ANCILLARY ONLY
Bacterial Vaginitis (gardnerella): NEGATIVE
Candida Glabrata: NEGATIVE
Candida Vaginitis: NEGATIVE
Comment: NEGATIVE
Comment: NEGATIVE
Comment: NEGATIVE

## 2023-08-24 LAB — SURGICAL PATHOLOGY

## 2023-08-26 NOTE — Progress Notes (Signed)
 GYNECOLOGY  VISIT  CC:   postcoital bleeding  HPI: 58 y.o. G69P2012 Married White or Caucasian female here for additional evaluation of postcoital bleeding.  In message sent via mychart, pt reports post coital bleeding has worsened.  She does not have any bleeding or spotting that is not relate to intercourse.   She has under updated pap smear, ultrasound with endometrium <80mm, trial of vaginal estrogen cream (which she now reports she did not use faithfully), vaginal estring , and vaginal estrogen suppository.  All of which helped modestly but not more than that.  Given worsening bleeding, felt additional evaluation was reasonable with ECC and possibly endometrial biopsy.   I have communicated with her neurologist, hematologist, and cardiologist who are all ok with pt using HRT.  Vivelle  dot and prometrium  200mg  nightly days 1-15 each month has been sent to the pharmacy.  She has not stared this yet.  Reviewed this with her today.  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 10/21/20    MEDS:   Current Outpatient Medications on File Prior to Visit  Medication Sig Dispense Refill   Ascorbic Acid (VITAMIN C) 500 MG CAPS as directed Orally twice a day     betamethasone dipropionate 0.05 % cream Apply topically 2 (two) times daily.     buPROPion  (WELLBUTRIN SR) 150 MG 12 hr tablet Take 150 mg by mouth 2 (two) times daily.     Cholecalciferol (VITAMIN D -3 PO) Take 1 tablet by mouth daily.     co-enzyme Q-10 30 MG capsule Take 30 mg by mouth 3 (three) times daily.     Erenumab -aooe (AIMOVIG ) 140 MG/ML SOAJ Inject 140 mg as directed every 30 (thirty) days. INJECT 140 MG INTO THE SKIN EVERY 28 DAYS 1 mL 11   estradiol  (VIVELLE -DOT) 0.05 MG/24HR patch Place 1 patch (0.05 mg total) onto the skin 2 (two) times a week. 24 patch 2   Multiple Vitamins-Minerals (MULTIVITAMIN WITH MINERALS) tablet Take 1 tablet by mouth daily.     Omega-3 Fatty Acids (FISH OIL) 1000 MG CAPS Take by mouth.     omeprazole (PRILOSEC) 20 MG capsule Take 20 mg by mouth daily.     OVER THE COUNTER MEDICATION Magnesium 325mg -Take 2 capsule by mouth daily.     Probiotic Product (PROBIOTIC-10 PO) Take by mouth.     progesterone  (PROMETRIUM ) 200 MG capsule Take 1 tablet days 1-15 each month. (Patient taking differently: Take 1 tablet days 1-15 each month. Hasn't picked up yet) 45 capsule 1   rosuvastatin  (CRESTOR ) 10 MG tablet TAKE 1 TABLET(10 MG) BY MOUTH DAILY 90 tablet 3   triamcinolone  cream (KENALOG) 0.1 % Apply topically 2 (two) times daily.  Ubrogepant  (UBRELVY ) 100 MG TABS Take 1 tablet (100 mg total) by mouth as needed (May repeat in 2 hours if needed.  Maximum 2 tablets in 24 hours.). 10 tablet 11   valACYclovir  (VALTREX ) 500 MG tablet Take 1 tablet (500 mg total) by mouth daily. 90 tablet 4   No current facility-administered medications on file prior to visit.    ALLERGIES: Levaquin [levofloxacin], Methylchloroisothiazolinone [methylisothiazolinone], and Keflex  [cephalexin ]  SH:  married, non smoker  Review of Systems  Constitutional: Negative.   Genitourinary:        Postcoital bleeding    PHYSICAL EXAMINATION:    BP 107/73 (BP Location: Left Arm, Patient Position: Sitting, Cuff Size: Large)   Pulse 81   Wt 205 lb (93 kg)   LMP  (LMP Unknown)  Comment: patient had spotting in October 2020  SpO2 97%   BMI 30.27 kg/m     General appearance: alert, cooperative and appears stated age  Lymph:  no inguinal LAD noted Pelvic: External genitalia:  no lesions              Urethra:  normal appearing urethra with no masses, tenderness or lesions              Bartholins and Skenes: normal                 Vagina: extremely atrophic vagnal tissue.  There is some bleeding just with placement of the speculum               Cervix: no lesions              Bimanual Exam:  Uterus:  normal size, contour, position, consistency, mobility, non-tender              Adnexa: no mass, fullness, tenderness . Procedure:  endocervical curettage obtained wtihout difficulty.2  Assessment/Plan: 1. Postcoital bleeding (Primary) - tissue will be sent to pathology.  Do not feel endometrial biopsy indicated due to thin endometrium on ultrasound and also bleeding is clearly from vaginal mucosa - Surgical pathology( [place]/ [facility]) - starting HRT with vivelle  dot and prometirium 200mg  days 1-15 each month.  She knows to call if has any bleeding.   - may need to consider vaginal estrogen as well.  2. Vaginal irritation - Cervicovaginal ancillary only( [place])

## 2023-09-01 ENCOUNTER — Ambulatory Visit (HOSPITAL_BASED_OUTPATIENT_CLINIC_OR_DEPARTMENT_OTHER): Payer: Self-pay | Admitting: Obstetrics & Gynecology

## 2023-09-10 ENCOUNTER — Other Ambulatory Visit: Payer: Self-pay | Admitting: Neurology

## 2023-09-21 ENCOUNTER — Encounter (HOSPITAL_BASED_OUTPATIENT_CLINIC_OR_DEPARTMENT_OTHER): Payer: Self-pay | Admitting: Obstetrics & Gynecology

## 2023-09-21 ENCOUNTER — Ambulatory Visit (HOSPITAL_BASED_OUTPATIENT_CLINIC_OR_DEPARTMENT_OTHER): Admitting: Obstetrics & Gynecology

## 2023-09-21 VITALS — BP 116/77 | HR 76 | Wt 209.6 lb

## 2023-09-21 DIAGNOSIS — Z9229 Personal history of other drug therapy: Secondary | ICD-10-CM

## 2023-09-21 DIAGNOSIS — Z79890 Hormone replacement therapy (postmenopausal): Secondary | ICD-10-CM

## 2023-09-21 DIAGNOSIS — Z8742 Personal history of other diseases of the female genital tract: Secondary | ICD-10-CM

## 2023-09-21 MED ORDER — PROGESTERONE 200 MG PO CAPS: 200.0000 mg | ORAL_CAPSULE | Freq: Every evening | ORAL | 2 refills | Status: AC

## 2023-09-21 NOTE — Progress Notes (Signed)
 GYNECOLOGY  VISIT  CC:   recheck after starting HRT  HPI: 58 y.o. G1P2012 Married White or Caucasian female here for vaginal recheck after starting HRT.  She has been experiencing significant vaginal atrophy and bleeding with intercourse.  She's undergone PAP, ultrasound and ECC.  All of this was negative.  Given medical hx, I did review starting HRT with her cardiologist, neurologist and oncologist.  All were ok with this..  She's not had any issues with starting except some very mild breast tenderness that has improved.  She only mentioned this when specifically asked.  She feels this is not bothersome.  Has not been SA yet.  Hasn't really noted in any increase discharge or odor.   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 10/21/20    MEDS:   Current Outpatient Medications on File Prior to Visit  Medication Sig Dispense Refill   AIMOVIG  140 MG/ML SOAJ INJECT 140 MG INTO THE SKIN EVERY 28 DAYS 1 mL 11   Ascorbic Acid (VITAMIN C) 500 MG CAPS as directed Orally twice a day     betamethasone dipropionate 0.05 % cream Apply topically 2 (two) times daily.     buPROPion (WELLBUTRIN SR) 150 MG 12 hr tablet Take 150 mg by mouth 2 (two) times daily.     Cholecalciferol (VITAMIN D -3 PO) Take 1 tablet by mouth daily.     co-enzyme Q-10 30 MG capsule Take 30 mg  by mouth 3 (three) times daily.     DTx App - Miscellaneous (OMADA GLP1 EXTENDED CARE TRACK) MISC Inject 1 mg as directed once a week.     estradiol  (VIVELLE -DOT) 0.05 MG/24HR patch Place 1 patch (0.05 mg total) onto the skin 2 (two) times a week. 24 patch 2   Multiple Vitamins-Minerals (MULTIVITAMIN WITH MINERALS) tablet Take 1 tablet by mouth daily.     Omega-3 Fatty Acids (FISH OIL) 1000 MG CAPS Take by mouth.     omeprazole (PRILOSEC) 20 MG capsule Take 20 mg by mouth daily.     OVER THE COUNTER MEDICATION Magnesium 325mg -Take 2 capsule by mouth daily.     Probiotic Product (PROBIOTIC-10 PO) Take by mouth.     progesterone  (PROMETRIUM ) 200 MG capsule Take 1 tablet days 1-15 each month. (Patient taking differently: Take 1 tablet days 1-15 each month. Hasn't picked up yet) 45 capsule 1   rosuvastatin  (CRESTOR ) 10 MG tablet TAKE 1 TABLET(10 MG) BY MOUTH DAILY 90 tablet 3   triamcinolone  cream (KENALOG) 0.1 % Apply topically 2 (two) times daily.     Ubrogepant  (UBRELVY ) 100 MG TABS Take 1 tablet (100 mg total) by mouth as needed (May repeat in 2 hours if needed.  Maximum 2 tablets in 24  hours.). 10 tablet 11   valACYclovir  (VALTREX ) 500 MG tablet Take 1 tablet (500 mg total) by mouth daily. 90 tablet 4   No current facility-administered medications on file prior to visit.    ALLERGIES: Levaquin [levofloxacin], Methylchloroisothiazolinone [methylisothiazolinone], and Keflex  [cephalexin ]  SH:  married, non smoker  Review of Systems  Constitutional: Negative.   Genitourinary: Negative.     PHYSICAL EXAMINATION:    BP 116/77 (BP Location: Right Arm, Patient Position: Sitting, Cuff Size: Large)   Pulse 76   Wt 209 lb 9.6 oz (95.1 kg)   LMP  (LMP Unknown) Comment: patient had spotting in October 2020  SpO2 95%   BMI 30.95 kg/m     General appearance: alert, cooperative and appears stated age  Lymph:  no inguinal LAD noted Pelvic: External genitalia:  no lesions              Urethra:   normal appearing urethra with no masses, tenderness or lesions              Bartholins and Skenes: normal                 Vagina: normal mucosa without prolapse or lesions and no bleeding with manipulation of speculum              Cervix: no lesions and no bleeding noted with touching cervix with scopette  Assessment/Plan: 1. History of postmenopausal bleeding (Primary) - this is much better with exam today.  No bleeding noted.  Feel beign SA would be reasonable at this time.  Asked her to call with any future bleeding.    2. History of postmenopausal HRT - will continue HRT but will switch to taking the progesterone  daily as this is more convenient for her.   - progesterone  (PROMETRIUM ) 200 MG capsule; Take 1 capsule (200 mg total) by mouth at bedtime.  Dispense: 90 capsule; Refill: 2

## 2023-09-24 DIAGNOSIS — Z8742 Personal history of other diseases of the female genital tract: Secondary | ICD-10-CM

## 2023-09-24 DIAGNOSIS — Z9229 Personal history of other drug therapy: Secondary | ICD-10-CM | POA: Insufficient documentation

## 2023-09-24 HISTORY — DX: Personal history of other diseases of the female genital tract: Z87.42

## 2023-09-24 HISTORY — DX: Personal history of other drug therapy: Z92.29

## 2023-09-26 ENCOUNTER — Encounter (HOSPITAL_BASED_OUTPATIENT_CLINIC_OR_DEPARTMENT_OTHER): Payer: Self-pay | Admitting: Obstetrics & Gynecology

## 2023-11-13 ENCOUNTER — Encounter (HOSPITAL_BASED_OUTPATIENT_CLINIC_OR_DEPARTMENT_OTHER): Payer: Self-pay | Admitting: Obstetrics & Gynecology

## 2023-11-16 ENCOUNTER — Other Ambulatory Visit (HOSPITAL_BASED_OUTPATIENT_CLINIC_OR_DEPARTMENT_OTHER): Payer: Self-pay | Admitting: Obstetrics & Gynecology

## 2023-11-16 DIAGNOSIS — N930 Postcoital and contact bleeding: Secondary | ICD-10-CM

## 2023-11-17 ENCOUNTER — Inpatient Hospital Stay: Attending: Hematology and Oncology

## 2023-11-17 ENCOUNTER — Other Ambulatory Visit: Payer: Self-pay | Admitting: Hematology and Oncology

## 2023-11-17 DIAGNOSIS — Z79899 Other long term (current) drug therapy: Secondary | ICD-10-CM | POA: Insufficient documentation

## 2023-11-17 LAB — CMP (CANCER CENTER ONLY)
ALT: 37 U/L (ref 0–44)
AST: 41 U/L (ref 15–41)
Albumin: 4.2 g/dL (ref 3.5–5.0)
Alkaline Phosphatase: 47 U/L (ref 38–126)
Anion gap: 4 — ABNORMAL LOW (ref 5–15)
BUN: 11 mg/dL (ref 6–20)
CO2: 26 mmol/L (ref 22–32)
Calcium: 9.4 mg/dL (ref 8.9–10.3)
Chloride: 108 mmol/L (ref 98–111)
Creatinine: 0.98 mg/dL (ref 0.44–1.00)
GFR, Estimated: >60 mL/min (ref >60)
Glucose, Bld: 92 mg/dL (ref 70–99)
Potassium: 4.2 mmol/L (ref 3.5–5.1)
Sodium: 138 mmol/L (ref 135–145)
Total Bilirubin: 0.3 mg/dL (ref 0.0–1.2)
Total Protein: 7.8 g/dL (ref 6.5–8.1)

## 2023-11-17 LAB — CBC WITH DIFFERENTIAL (CANCER CENTER ONLY)
Abs Immature Granulocytes: 0.02 K/uL (ref 0.00–0.07)
Basophils Absolute: 0.1 K/uL (ref 0.0–0.1)
Basophils Relative: 1 %
Eosinophils Absolute: 0.3 K/uL (ref 0.0–0.5)
Eosinophils Relative: 4 %
HCT: 39.8 % (ref 36.0–46.0)
Hemoglobin: 13.9 g/dL (ref 12.0–15.0)
Immature Granulocytes: 0 %
Lymphocytes Relative: 37 %
Lymphs Abs: 2.9 K/uL (ref 0.7–4.0)
MCH: 31.9 pg (ref 26.0–34.0)
MCHC: 34.9 g/dL (ref 30.0–36.0)
MCV: 91.3 fL (ref 80.0–100.0)
Monocytes Absolute: 0.8 K/uL (ref 0.1–1.0)
Monocytes Relative: 11 %
Neutro Abs: 3.8 K/uL (ref 1.7–7.7)
Neutrophils Relative %: 47 %
Platelet Count: 319 K/uL (ref 150–400)
RBC: 4.36 MIL/uL (ref 3.87–5.11)
RDW: 13.3 % (ref 11.5–15.5)
WBC Count: 7.9 K/uL (ref 4.0–10.5)
nRBC: 0 % (ref 0.0–0.2)

## 2023-11-17 LAB — RETIC PANEL
Immature Retic Fract: 8.2 % (ref 2.3–15.9)
RBC.: 4.42 MIL/uL (ref 3.87–5.11)
Retic Count, Absolute: 59.2 K/uL (ref 19.0–186.0)
Retic Ct Pct: 1.3 % (ref 0.4–3.1)
Reticulocyte Hemoglobin: 35.9 pg (ref >27.9)

## 2023-11-17 LAB — IRON AND IRON BINDING CAPACITY (CC-WL,HP ONLY)
Iron: 67 ug/dL (ref 28–170)
Saturation Ratios: 24 % (ref 10.4–31.8)
TIBC: 279 ug/dL (ref 250–450)
UIBC: 212 ug/dL (ref 148–442)

## 2023-11-17 LAB — FERRITIN: Ferritin: 135 ng/mL (ref 11–307)

## 2023-11-20 ENCOUNTER — Other Ambulatory Visit (HOSPITAL_BASED_OUTPATIENT_CLINIC_OR_DEPARTMENT_OTHER): Payer: Self-pay | Admitting: Obstetrics & Gynecology

## 2023-11-20 DIAGNOSIS — N930 Postcoital and contact bleeding: Secondary | ICD-10-CM

## 2023-11-21 ENCOUNTER — Encounter (HOSPITAL_COMMUNITY): Payer: Self-pay | Admitting: Obstetrics & Gynecology

## 2023-11-21 ENCOUNTER — Other Ambulatory Visit: Payer: Self-pay

## 2023-11-21 NOTE — Progress Notes (Signed)
 SDW CALL  Patient was given pre-op instructions over the phone. The opportunity was given for the patient to ask questions. No further questions asked. Patient verbalized understanding of instructions given.   PCP - Isaiah Geralds Cardiologist - Dr. Darryle Decent - LOV 05/01/23  PPM/ICD - denies   Chest x-ray - denies EKG - 05/01/23 Stress Test - denies ECHO - denies Cardiac Cath - denies  Sleep Study - OSA+ - pt reports very mild and wears a mouthguard   Pre-diabetes and does not check blood sugar at home  Last dose of GLP1 agonist-  n/a  GLP1 instructions:  n/a  Blood Thinner Instructions: n/a Aspirin Instructions: n/a  ERAS Protcol - clears until 815 PRE-SURGERY Ensure or G2- na  COVID TEST- no   Anesthesia review: yes - non-obstructive CAD  Patient denies shortness of breath, fever, cough and chest pain over the phone call   All instructions explained to the patient, with a verbal understanding of the material. Patient agrees to go over the instructions while at home for a better understanding.

## 2023-11-22 ENCOUNTER — Other Ambulatory Visit (HOSPITAL_COMMUNITY): Payer: Self-pay

## 2023-11-22 ENCOUNTER — Telehealth: Payer: Self-pay | Admitting: Pharmacy Technician

## 2023-11-22 NOTE — Telephone Encounter (Signed)
 Pharmacy Patient Advocate Encounter   Received notification from CoverMyMeds that prior authorization for AIMOVIG  140MG  is required/requested.   Insurance verification completed.   The patient is insured through [facility].   Per test claim: PA required; PA submitted to above mentioned insurance via Latent Key/confirmation #/EOC [id number] Status is pending

## 2023-11-22 NOTE — Telephone Encounter (Signed)
 Pharmacy Patient Advocate Encounter  Received notification from [facility] that Prior Authorization for AIMOVIG  140MG  has been APPROVED from 10.29.25 to 10.29.26. Ran test claim, Copay is $0. This test claim was processed through [facility]- copay amounts may vary at other pharmacies due to pharmacy/plan contracts, or as the patient moves through the different stages of their insurance plan.   PA #/Case ID/Reference #: [id number]

## 2023-11-22 NOTE — Anesthesia Preprocedure Evaluation (Signed)
 Anesthesia Evaluation  Patient identified by MRN, date of birth, ID band Patient awake    Reviewed: Allergy & Precautions, NPO status , Patient's Chart, lab work & pertinent test results  History of Anesthesia Complications Negative for: history of anesthetic complications  Airway Mallampati: I  TM Distance: >3 FB Neck ROM: Full    Dental  (+) Teeth Intact, Dental Advisory Given   Pulmonary    breath sounds clear to auscultation       Cardiovascular  Rhythm:Regular Rate:Normal     Neuro/Psych    GI/Hepatic   Endo/Other    Renal/GU      Musculoskeletal   Abdominal   Peds  Hematology   Anesthesia Other Findings   Reproductive/Obstetrics                              Anesthesia Physical Anesthesia Plan  ASA: 2  Anesthesia Plan: General   Post-op Pain Management:    Induction: Intravenous  PONV Risk Score and Plan: 2 and Ondansetron  and Treatment may vary due to age or medical condition  Airway Management Planned: Oral ETT  Additional Equipment:   Intra-op Plan:   Post-operative Plan: Extubation in OR  Informed Consent:      Dental advisory given  Plan Discussed with: CRNA and Surgeon  Anesthesia Plan Comments: (PAT note written 11/22/2023 by Isaiah Ruder, PA-C.  )         Anesthesia Quick Evaluation

## 2023-11-22 NOTE — Progress Notes (Signed)
 Anesthesia Chart Review: CANDELARIA MICHAE POLITE  Case: [id number] Date/Time: 11/23/23 1112   Procedure: DILATATION AND CURETTAGE /HYSTEROSCOPY - hysteroscopy with dilation and curettage, endocervical curettage   Anesthesia type: Choice   Diagnosis: Postcoital bleeding [N93.0]   Pre-op diagnosis: Postcoital bleeding   [facility] ROOM 08 / [facility]   Surgeons: Cleotilde Ronal RAMAN, MD       DISCUSSION: Patient is a 58 year old female scheduled for the above procedure.  History includes never smoker, prediabetes, MUGA's, hereditary hemochromatosis (has not required phlebotomy as of 05/18/2023), IBS, GERD, OSA (mild 07/2023, uses mouthguard), migraines. Minimal CAD (0-24% RCA, LAD) 03/2022 CCTA.   Coronary CTA on 04/05/2022 indicated a coronary calcium  score of 5.8, this was 77th percentile for age, sex and race matched control.  She had minimal soft plaque in the RCA and distal LAD.  Was recommended that she start on Crestor  10 mg daily. One year follow-up planned at her 05/01/2023 APP visit.  NSR on 05/01/2023 EKG. Unremarkable CBC and CMP on 11/17/2023. Anesthesia team to evaluate on the day of surgery.   VS: LMP  (LMP Unknown) Comment: patient had spotting in October 2020.  Wt Readings from Last 3 Encounters:  09/21/23 95.1 kg  08/23/23 93 kg  06/14/23 92.8 kg   BP Readings from Last 3 Encounters:  09/21/23 116/77  08/23/23 107/73  08/22/23 125/77   Pulse Readings from Last 3 Encounters:  09/21/23 76  08/23/23 81  06/14/23 80     PROVIDERS: Waddell Rake, MD is PCP Barbaraann Kotyk, MD is cardiologist Skeet Cornet, DO is neurologist Adnan, Javaid, MD is pulmonologist Federico Norleen CLORE, MD is HEM   LABS: Last lab results in [facility] include: Lab Results  Component Value Date   WBC 7.9 11/17/2023   HGB 13.9 11/17/2023   HCT 39.8 11/17/2023   PLT 319 11/17/2023   GLUCOSE 92 11/17/2023   CHOL 135 05/09/2023   TRIG 75 05/09/2023   HDL 50 05/09/2023   LDLCALC 70 05/09/2023   ALT 37 11/17/2023    AST 41 11/17/2023   NA 138 11/17/2023   K 4.2 11/17/2023   CL 108 11/17/2023   CREATININE 0.98 11/17/2023   BUN 11 11/17/2023   CO2 26 11/17/2023   TSH 1.38 11/22/2022    Sleep Study 08/02/2023 ([facility]): shows mild OSA with an overall AHI of approximately 7.1, severe when supine.    IMAGES: Pelvic US  06/14/2023: FINDINGS: - Uterus: 5.3 x 4.9 x 4.2cm.  Volume: 59.44ml - Endometrial thickness:  3.6mm, no abnormal blood flow - Right ovary:  2.8 x 2.3 x 2.2cm.  Volume:  7.9ml - Left ovary:  1.9 x 2.4 x 1.7cm.  Volume:  4.57ml - Other findings:  No abnormal free fluid.  Cervix WNL.   MRI/MRA Head 05/19/2021: IMPRESSION: No intracranial mass or other significant abnormality. Unremarkable vascular imaging.    EKG: 05/01/2023: Normal sinus rhythm Normal ECG When compared with ECG of 17-Apr-2021 22:36, T wave amplitude has increased in Lateral leads Confirmed by West, Katlyn [phone]) on 05/01/2023 3:35:05 PM   CV: CTA Coronary 04/05/2022: IMPRESSION: 1. Coronary calcium  score of 5.80. This was 32 percentile for age-, sex, and race-matched controls. 2. Normal coronary origin with right dominance. 3. Minimal (0-24) soft plaque in the RCA and distal LAD. RECOMMENDATIONS: CAD-RADS 1: Minimal non-obstructive CAD (0-24%). Consider non-atherosclerotic causes of chest pain. Consider preventive therapy and risk factor modification.    CT Cardiac Calcium  Scoring 11/17/2021: IMPRESSION: 1. Coronary artery calcium  score of 0.  No signs of calcified coronary artery disease. 2. Small hiatal hernia.    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 10/21/20   Pre-diabetes    Sleep apnea     Past Surgical History:  Procedure Laterality Date   BLADDER SUSPENSION N/A 10/26/2020   Procedure: TRANSVAGINAL TAPE (TVT) PROCEDURE;  Surgeon: Marilynne Rosaline SAILOR, MD;  Location: [facility];  Service: Gynecology;  Laterality: N/A;   BREAST REDUCTION SURGERY Bilateral 01/13/2022   wtih abdominoplasty   BUNIONECTOMY Bilateral    CHOLECYSTECTOMY  2001   CYSTOSCOPY N/A 10/26/2020   Procedure: CYSTOSCOPY;  Surgeon: Marilynne Rosaline SAILOR, MD;  Location: [facility];  Service: Gynecology;  Laterality: N/A;   NASAL SEPTUM SURGERY     RECTOCELE REPAIR N/A 10/26/2020   Procedure: POSTERIOR REPAIR (RECTOCELE) with perineorrhaphy;  Surgeon: Marilynne Rosaline SAILOR, MD;  Location: [facility];  Service: Gynecology;  Laterality: N/A;  total time requested for all procedures is 1.5 hours   REDUCTION MAMMAPLASTY     TONSILLECTOMY      MEDICATIONS: No current facility-administered medications for this encounter.    AIMOVIG  140 MG/ML SOAJ   Ascorbic Acid (VITAMIN C PO)   betamethasone dipropionate 0.05 % cream   Biotin 10000 MCG TABS   buPROPion (WELLBUTRIN SR) 150 MG 12 hr tablet   Coenzyme Q10 300 MG CAPS   DTx App - Miscellaneous (OMADA GLP1 EXTENDED CARE TRACK) MISC   estradiol  (VIVELLE -DOT) 0.05 MG/24HR patch   Magnesium 200 MG TABS   Multiple Vitamins-Minerals (MULTIVITAMIN WITH MINERALS) tablet   Omega-3 Fatty Acids (FISH OIL PO)   omeprazole (PRILOSEC) 20 MG capsule   progesterone  (PROMETRIUM ) 200 MG capsule   rosuvastatin  (CRESTOR ) 10 MG tablet   triamcinolone  cream (KENALOG) 0.1 %   TURMERIC CURCUMIN  PO   Ubrogepant  (UBRELVY ) 100 MG TABS   valACYclovir  (VALTREX ) 500 MG tablet   Vitamin D -Vitamin K (VITAMIN D2 + K1 PO)    Isaiah Ruder, PA-C Surgical Short Stay/Anesthesiology [facility] Phone [phone] [facility] Phone [phone] 11/22/2023 12:59 PM

## 2023-11-23 ENCOUNTER — Encounter (HOSPITAL_COMMUNITY): Payer: Self-pay | Admitting: Obstetrics & Gynecology

## 2023-11-23 ENCOUNTER — Ambulatory Visit (HOSPITAL_COMMUNITY): Payer: Self-pay | Admitting: Vascular Surgery

## 2023-11-23 ENCOUNTER — Ambulatory Visit (HOSPITAL_COMMUNITY)
Admission: RE | Admit: 2023-11-23 | Discharge: 2023-11-23 | Disposition: A | Discharge status: Home / Self Care | Attending: Obstetrics & Gynecology | Admitting: Obstetrics & Gynecology

## 2023-11-23 ENCOUNTER — Other Ambulatory Visit (HOSPITAL_COMMUNITY): Payer: Self-pay

## 2023-11-23 ENCOUNTER — Encounter (HOSPITAL_COMMUNITY)
Admission: RE | Disposition: A | Discharge status: Home / Self Care | Payer: Self-pay | Source: Home / Self Care | Attending: Obstetrics & Gynecology

## 2023-11-23 ENCOUNTER — Other Ambulatory Visit: Payer: Self-pay

## 2023-11-23 DIAGNOSIS — N930 Postcoital and contact bleeding: Secondary | ICD-10-CM | POA: Diagnosis present

## 2023-11-23 DIAGNOSIS — Z780 Asymptomatic menopausal state: Secondary | ICD-10-CM | POA: Insufficient documentation

## 2023-11-23 DIAGNOSIS — K589 Irritable bowel syndrome without diarrhea: Secondary | ICD-10-CM | POA: Diagnosis not present

## 2023-11-23 DIAGNOSIS — K219 Gastro-esophageal reflux disease without esophagitis: Secondary | ICD-10-CM | POA: Insufficient documentation

## 2023-11-23 DIAGNOSIS — R7303 Prediabetes: Secondary | ICD-10-CM | POA: Diagnosis not present

## 2023-11-23 DIAGNOSIS — G4730 Sleep apnea, unspecified: Secondary | ICD-10-CM | POA: Insufficient documentation

## 2023-11-23 DIAGNOSIS — Z9229 Personal history of other drug therapy: Secondary | ICD-10-CM

## 2023-11-23 HISTORY — DX: Sleep apnea, unspecified: G47.30

## 2023-11-23 HISTORY — DX: Postcoital and contact bleeding: N93.0

## 2023-11-23 HISTORY — PX: MYOSURE RESECTION: SHX7611

## 2023-11-23 HISTORY — PX: HYSTEROSCOPY WITH D & C: SHX1775

## 2023-11-23 HISTORY — DX: Prediabetes: R73.03

## 2023-11-23 LAB — CBC
HCT: 43 % (ref 36.0–46.0)
Hemoglobin: 14.7 g/dL (ref 12.0–15.0)
MCH: 31.5 pg (ref 26.0–34.0)
MCHC: 34.2 g/dL (ref 30.0–36.0)
MCV: 92.1 fL (ref 80.0–100.0)
Platelets: 337 K/uL (ref 150–400)
RBC: 4.67 MIL/uL (ref 3.87–5.11)
RDW: 13.5 % (ref 11.5–15.5)
WBC: 9.7 K/uL (ref 4.0–10.5)
nRBC: 0 % (ref 0.0–0.2)

## 2023-11-23 SURGERY — DILATATION AND CURETTAGE /HYSTEROSCOPY
Anesthesia: General | Site: Uterus

## 2023-11-23 MED ORDER — PROPOFOL 10 MG/ML IV BOLUS
INTRAVENOUS | Status: DC | PRN
Start: 2023-11-23 — End: 2023-11-23
  Administered 2023-11-23: 100 mg via INTRAVENOUS
  Administered 2023-11-23: 40 mg via INTRAVENOUS

## 2023-11-23 MED ORDER — DROPERIDOL 2.5 MG/ML IJ SOLN
0.6250 mg | Freq: Once | INTRAMUSCULAR | Status: DC | PRN
Start: 2023-11-23 — End: 2023-11-23

## 2023-11-23 MED ORDER — FENTANYL CITRATE (PF) 100 MCG/2ML IJ SOLN: 25.0000 ug | INTRAMUSCULAR | Status: DC | PRN

## 2023-11-23 MED ORDER — OXYCODONE HCL 5 MG/5ML PO SOLN: 5.0000 mg | Freq: Once | ORAL | Status: DC | PRN

## 2023-11-23 MED ORDER — POVIDONE-IODINE 10 % EX SWAB
2.0000 | Freq: Once | CUTANEOUS | Status: AC
  Administered 2023-11-23: 2 via TOPICAL

## 2023-11-23 MED ORDER — LACTATED RINGERS IV SOLN: INTRAVENOUS | Status: DC

## 2023-11-23 MED ORDER — CHLORHEXIDINE GLUCONATE 0.12 % MT SOLN
OROMUCOSAL | Status: AC
  Administered 2023-11-23: 15 mL via OROMUCOSAL
  Filled 2023-11-23: qty 15

## 2023-11-23 MED ORDER — CHLORHEXIDINE GLUCONATE 0.12 % MT SOLN: 15.0000 mL | Freq: Once | OROMUCOSAL | Status: AC

## 2023-11-23 MED ORDER — ONDANSETRON HCL 4 MG/2ML IJ SOLN
INTRAMUSCULAR | Status: AC
  Filled 2023-11-23: qty 2

## 2023-11-23 MED ORDER — LIDOCAINE-EPINEPHRINE 1 %-1:100000 IJ SOLN
INTRAMUSCULAR | Status: DC | PRN
  Administered 2023-11-23: 8 mL

## 2023-11-23 MED ORDER — LIDOCAINE 2% (20 MG/ML) 5 ML SYRINGE
INTRAMUSCULAR | Status: AC
  Filled 2023-11-23: qty 5

## 2023-11-23 MED ORDER — MIDAZOLAM HCL (PF) 2 MG/2ML IJ SOLN
INTRAMUSCULAR | Status: DC | PRN
  Administered 2023-11-23: 2 mg via INTRAVENOUS

## 2023-11-23 MED ORDER — FENTANYL CITRATE (PF) 100 MCG/2ML IJ SOLN
INTRAMUSCULAR | Status: AC
  Filled 2023-11-23: qty 2

## 2023-11-23 MED ORDER — IBUPROFEN 800 MG PO TABS
800.0000 mg | ORAL_TABLET | Freq: Three times a day (TID) | ORAL | 0 refills | Status: AC | PRN
  Filled 2023-11-23: qty 20, 7d supply, fill #0

## 2023-11-23 MED ORDER — OXYCODONE HCL 5 MG PO TABS: 5.0000 mg | ORAL_TABLET | Freq: Once | ORAL | Status: DC | PRN

## 2023-11-23 MED ORDER — LACTATED RINGERS IV SOLN: INTRAVENOUS | Status: DC | PRN

## 2023-11-23 MED ORDER — DEXAMETHASONE SOD PHOSPHATE PF 10 MG/ML IJ SOLN
INTRAMUSCULAR | Status: DC | PRN
  Administered 2023-11-23: 10 mg via INTRAVENOUS

## 2023-11-23 MED ORDER — ONDANSETRON HCL 4 MG/2ML IJ SOLN
INTRAMUSCULAR | Status: DC | PRN
  Administered 2023-11-23: 4 mg via INTRAVENOUS

## 2023-11-23 MED ORDER — SODIUM CHLORIDE 0.9 % IR SOLN
Status: DC | PRN
Start: 2023-11-23 — End: 2023-11-23
  Administered 2023-11-23: 3000 mL

## 2023-11-23 MED ORDER — MIDAZOLAM HCL 2 MG/2ML IJ SOLN
INTRAMUSCULAR | Status: AC
  Filled 2023-11-23: qty 2

## 2023-11-23 MED ORDER — FENTANYL CITRATE (PF) 100 MCG/2ML IJ SOLN
INTRAMUSCULAR | Status: DC | PRN
  Administered 2023-11-23 (×2): 50 ug via INTRAVENOUS

## 2023-11-23 MED ORDER — EPHEDRINE SULFATE (PRESSORS) 25 MG/5ML IV SOSY
PREFILLED_SYRINGE | INTRAVENOUS | Status: DC | PRN
  Administered 2023-11-23 (×4): 5 mg via INTRAVENOUS

## 2023-11-23 MED ORDER — HYDROCODONE-ACETAMINOPHEN 5-325 MG PO TABS
1.0000 | ORAL_TABLET | Freq: Four times a day (QID) | ORAL | 0 refills | Status: AC | PRN
  Filled 2023-11-23: qty 10, 2d supply, fill #0

## 2023-11-23 MED ORDER — ORAL CARE MOUTH RINSE: 15.0000 mL | Freq: Once | OROMUCOSAL | Status: AC

## 2023-11-23 MED ORDER — ONDANSETRON HCL 4 MG/2ML IJ SOLN: 4.0000 mg | Freq: Once | INTRAMUSCULAR | Status: DC | PRN

## 2023-11-23 MED ORDER — ACETAMINOPHEN 500 MG PO TABS
1000.0000 mg | ORAL_TABLET | ORAL | Status: AC
  Administered 2023-11-23: 1000 mg via ORAL
  Filled 2023-11-23: qty 2

## 2023-11-23 MED ORDER — LIDOCAINE 2% (20 MG/ML) 5 ML SYRINGE
INTRAMUSCULAR | Status: DC | PRN
  Administered 2023-11-23: 80 mg via INTRAVENOUS

## 2023-11-23 MED ORDER — ACETAMINOPHEN 10 MG/ML IV SOLN: 1000.0000 mg | Freq: Once | INTRAVENOUS | Status: DC | PRN

## 2023-11-23 SURGICAL SUPPLY — 17 items
CANISTER SUCTION 3000ML PPV (SUCTIONS) ×2 IMPLANT
CATH ROBINSON RED A/P 16FR (CATHETERS) ×2 IMPLANT
COVER MAYO STAND STRL (DRAPES) ×2 IMPLANT
DEVICE MYOSURE LITE (MISCELLANEOUS) IMPLANT
DEVICE MYOSURE REACH (MISCELLANEOUS) IMPLANT
DILATOR CANAL MILEX (MISCELLANEOUS) IMPLANT
GLOVE ECLIPSE 6.5 STRL STRAW (GLOVE) ×2 IMPLANT
GLOVE SURG UNDER POLY LF SZ7 (GLOVE) ×4 IMPLANT
GOWN STRL REUS W/ TWL LRG LVL3 (GOWN DISPOSABLE) ×4 IMPLANT
GOWN STRL REUS W/ TWL XL LVL3 (GOWN DISPOSABLE) IMPLANT
KIT PROCED FLUENT PRO FLT212S (KITS) ×2 IMPLANT
KIT TURNOVER KIT B (KITS) ×2 IMPLANT
PACK VAGINAL MINOR WOMEN LF (CUSTOM PROCEDURE TRAY) ×2 IMPLANT
PAD OB MATERNITY 11 LF (PERSONAL CARE ITEMS) ×2 IMPLANT
SEAL ROD LENS SCOPE MYOSURE (ABLATOR) ×2 IMPLANT
TOWEL GREEN STERILE FF (TOWEL DISPOSABLE) ×4 IMPLANT
UNDERPAD 30X36 HEAVY ABSORB (UNDERPADS AND DIAPERS) ×2 IMPLANT

## 2023-11-23 NOTE — Op Note (Signed)
 11/23/2023  12:13 PM  PATIENT:  Gwendolyn Ramos  58 y.o. female  PRE-OPERATIVE DIAGNOSIS:  Postcoital bleeding  POST-OPERATIVE DIAGNOSIS:  Postcoital bleeding  PROCEDURE:  Procedure(s): DILATATION AND CURETTAGE LELDON NAIL RESECTION  SURGEON:  Ronal GORMAN Pinal  ASSISTANTS: OR staff.    ANESTHESIA:   general  ESTIMATED BLOOD LOSS: 5cc  BLOOD ADMINISTERED:none   FLUIDS: 400cc LR  UOP: pt voided before going back to the OR  SPECIMEN:  endometrial tissue, endocervical tissue  DISPOSITION OF SPECIMEN:  PATHOLOGY  FINDINGS: thin appearing endometrium and no lesions, no endocervical lesions, atrophic vaginal tissue especially anterior to the cervix  DESCRIPTION OF OPERATION: Patient was taken to the operating room.  She is placed in the supine position. SCDs were on her lower extremities and functioning properly. General anesthesia with an LMA was administered without difficulty. Dr. Waddell, anesthesia, oversaw case.  Legs were then placed in the [facility] stirrups in the low lithotomy position. The legs were lifted to the high lithotomy position and the Betadine  prep was used on the inner thighs perineum and vagina x3. Patient was draped in a normal standard fashion. A bivalve speculum was placed the vagina. The anterior lip of the cervix was grasped with single-tooth tenaculum.  A paracervical block of 1% lidocaine  mixed one-to-one with epinephrine  (1:100,000 units).  8 cc was used total. The cervix is dilated up to [facility] dilators. The endometrial cavity sounded to 8 cm.   A 5.5 millimeter diagnostic hysteroscope was obtained. Normal saline was used as a hysteroscopic fluid. The hysteroscope was advanced through the endocervical canal into the endometrial cavity. The tubal ostia were noted bilaterally. Endometrium was thin and there were no intracavitary lesions.  Endocervix also appeared visually normal.  .  The hysteroscope was removed. Using the [facility] lite, direct  endometrial tissue sampling was obtained without difficulty.  At this point, the hysteroscope was removed. The fluid deficit was 50 cc. ECC was then performed without difficulty.  The tenaculum was removed from the anterior lip of the cervix. The speculum was removed from the vagina. The prep was cleansed of the patient's skin. The legs are positioned back in the supine position. Sponge, lap, needle, instrument counts were correct x2. Patient was taken to recovery in stable condition.  COUNTS:  YES  PLAN OF CARE: Transfer to PACU

## 2023-11-23 NOTE — Transfer of Care (Signed)
 Immediate Anesthesia Transfer of Care Note  Patient: Gwendolyn Ramos [facility]  Procedure(s) Performed: DILATATION AND CURETTAGE /HYSTEROSCOPY (Uterus) MYOSURE RESECTION (Uterus)  Patient Location: PACU  Anesthesia Type:General  Level of Consciousness: awake, alert , oriented, and patient cooperative  Airway & Oxygen Therapy: Patient Spontanous Breathing  Post-op Assessment: Report given to RN, Post -op Vital signs reviewed and stable, Patient moving all extremities X 4, and Patient able to stick tongue midline  Post vital signs: Reviewed and stable  Last Vitals:  Vitals Value Taken Time  BP 140/85 11/23/23 12:20  Temp 36.5 C 11/23/23 12:20  Pulse 72 11/23/23 12:22  Resp 10 11/23/23 12:22  SpO2 99 % 11/23/23 12:22  Vitals shown include unfiled device data.  Last Pain:  Vitals:   11/23/23 1220  TempSrc:   PainSc: Asleep         Complications: No notable events documented.

## 2023-11-23 NOTE — Anesthesia Procedure Notes (Signed)
 Procedure Name: LMA Insertion Date/Time: 11/23/2023 11:42 AM  Performed by: Viviana Almarie DASEN, CRNAPre-anesthesia Checklist: Patient identified, Emergency Drugs available, Suction available, Patient being monitored and Timeout performed Patient Re-evaluated:Patient Re-evaluated prior to induction Oxygen Delivery Method: Circle system utilized Preoxygenation: Pre-oxygenation with 100% oxygen Induction Type: IV induction LMA: LMA inserted LMA Size: 4.0 Number of attempts: 1 Placement Confirmation: positive ETCO2 and breath sounds checked- equal and bilateral Tube secured with: Tape

## 2023-11-23 NOTE — Anesthesia Postprocedure Evaluation (Signed)
 Anesthesia Post Note  Patient: Gwendolyn Ramos [facility]  Procedure(s) Performed: DILATATION AND CURETTAGE /HYSTEROSCOPY (Uterus) MYOSURE RESECTION (Uterus)     Patient location during evaluation: PACU Anesthesia Type: General Level of consciousness: awake Pain management: pain level controlled Vital Signs Assessment: post-procedure vital signs reviewed and stable Respiratory status: spontaneous breathing Cardiovascular status: blood pressure returned to baseline Postop Assessment: no apparent nausea or vomiting Anesthetic complications: no   No notable events documented.  Last Vitals:  Vitals:   11/23/23 1300 11/23/23 1315  BP: 132/80 129/83  Pulse: 71 71  Resp: 12 16  Temp:  36.5 C  SpO2: 97% 99%    Last Pain:  Vitals:   11/23/23 1230  TempSrc:   PainSc: Asleep                 Lauraine KATHEE [place]

## 2023-11-23 NOTE — Discharge Instructions (Signed)
Post-surgical Instructions, Outpatient Surgery ° °You may expect to feel dizzy, weak, and drowsy for as long as 24 hours after receiving the medicine that made you sleep (anesthetic). For the first 24 hours after your surgery:   °· Do not drive a car, ride a bicycle, participate in physical activities, or take public transportation until you are done taking narcotic pain medicines or as directed by Dr. Hisashi Amadon.  °· Do not drink alcohol or take tranquilizers.  °· Do not take medicine that has not been prescribed by your physicians.  °· Do not sign important papers or make important decisions while on narcotic pain medicines.  °· Have a responsible person with you.  ° °PAIN MANAGEMENT °· Motrin 800mg.  (This is the same as 4-200mg over the counter tablets of Motrin or ibuprofen.)  You may take this every eight hours or as needed for cramping.   °· Vicodin 5/325mg.  For more severe pain, take one or two tablets every four to six hours as needed for pain control.  (Remember that narcotic pain medications increase your risk of constipation.  If this becomes a problem, you may take an over the counter stool softener like Colace 100mg up to four times a day.) ° °DO'S AND DON'T'S °· Do not take a tub bath for one week.  You may shower on the first day after your surgery °· Do not do any heavy lifting for one to two weeks.  This increases the chance of bleeding. °· Do move around as you feel able.  Stairs are fine.  You may begin to exercise again as you feel able.  Do not lift any weights for two weeks. °· Do not put anything in the vagina for two weeks--no tampons, intercourse, or douching.   ° °REGULAR MEDIATIONS/VITAMINS: °· You may restart all of your regular medications as prescribed. °· You may restart all of your vitamins as you normally take them.   ° °PLEASE CALL OR SEEK MEDICAL CARE IF: °· You have persistent nausea and vomiting.  °· You have trouble eating or drinking.  °· You have an oral temperature above 100.5.   °· You have constipation that is not helped by adjusting diet or increasing fluid intake. Pain medicines are a common cause of constipation.  °· You have heavy vaginal bleeding ° °

## 2023-11-23 NOTE — H&P (Signed)
 Gwendolyn Ramos is an 58 y.o. female G3P2A1 with postcoital bleeding here for additional evaluation with hysteroscopy, endometrial sampling and ECC.  Pt had significant atrophic changes which I felt was contributing and after conferring with her neurologist and cardiologist and after failing several topica/vaginal options, started transdermal estradiol  with oral progesterone .  This has significantly improved with the HRT.  She did have a pap smear, ultrasound and ECC for evaluation.  Although these have not shown any abnormality and the bleeding is much improved, she is still having a small amount of spotting with intercourse only.  We discussed additional evaluation with the above procedure.  Procedure discussed with patient.  Recovery and pain management discussed.  Risks discussed including but not limited to bleeding, rare risk of transfusion, infection, 1% risk of uterine perforation with risks of fluid deficit causing cardiac arrythmia, cerebral swelling and/or need to stop procedure early.  Fluid emboli and rare risk of death discussed.  DVT/PE, rare risk of risk of bowel/bladder/ureteral/vascular injury.  Patient aware if pathology abnormal she may need additional treatment.  All questions answered.     Pertinent Gynecological History: Menses: post-menopausal Bleeding: postcoital Contraception: post menopausal status DES exposure: denies Blood transfusions: none Sexually transmitted diseases: no past history Previous GYN Procedures: rectocele repair  Last mammogram: normal Date: 06/22/2023 Last pap: normal Date: 06/02/2023 OB History: G3, P2   Menstrual History: No LMP recorded (lmp unknown). Patient is postmenopausal.    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 10/21/20   Pre-diabetes    Sleep apnea     Past Surgical History:  Procedure Laterality Date   BLADDER SUSPENSION N/A 10/26/2020   Procedure: TRANSVAGINAL TAPE (TVT) PROCEDURE;  Surgeon: Marilynne Rosaline SAILOR, MD;  Location: [facility];  Service: Gynecology;  Laterality: N/A;   BREAST REDUCTION SURGERY Bilateral 01/13/2022   wtih abdominoplasty   BUNIONECTOMY Bilateral    CHOLECYSTECTOMY  2001   CYSTOSCOPY N/A 10/26/2020   Procedure: CYSTOSCOPY;  Surgeon: Marilynne Rosaline SAILOR, MD;  Location: [facility];  Service: Gynecology;  Laterality: N/A;   NASAL SEPTUM SURGERY     RECTOCELE REPAIR N/A 10/26/2020   Procedure: POSTERIOR REPAIR (RECTOCELE) with perineorrhaphy;  Surgeon: Marilynne Rosaline SAILOR, MD;  Location: [facility];  Service: Gynecology;  Laterality: N/A;  total time requested for all procedures is 1.5 hours   REDUCTION MAMMAPLASTY     TONSILLECTOMY      Family History  Problem Relation Age of Onset   Diabetes Mother    COPD Mother    Congestive Heart Failure Mother    Thyroid disease Mother    Hemochromatosis Father    Alcoholism Father    Breast cancer Sister 43  invasive lobular breast cancer, negative genetic testing   Diabetes Sister    Asthma Sister    Asthma Brother    Diabetes Maternal Aunt    Breast cancer Maternal Aunt    Heart attack Maternal Grandfather    Diabetes Other     Social History:  reports that she has never smoked. She has never used smokeless tobacco. She reports  current alcohol use of about 3.0 standard drinks of alcohol per week. She reports that she does not use drugs.  Allergies:  Allergies  Allergen Reactions   Levaquin [Levofloxacin]     High fever, elevated heart rate   Methylchloroisothiazolinone [Methylisothiazolinone]     In liquid soaps causes skin irritation.   Keflex  [Cephalexin ] Rash    Caused possible rash of arms, HA    Medications Prior to Admission  Medication Sig Dispense Refill Last Dose/Taking   AIMOVIG  140 MG/ML SOAJ INJECT 140 MG INTO THE SKIN EVERY 28 DAYS 1 mL 11 Past Month   Ascorbic Acid (VITAMIN C PO) Take 1 capsule by mouth daily. Super C   Past Week   betamethasone dipropionate 0.05 % cream Apply 1 Application topically daily as needed Wolm).   Past Week   Biotin 89999 MCG TABS Take 10,000 mg by mouth daily.   Past Week   buPROPion (WELLBUTRIN SR) 150 MG 12 hr tablet Take 150 mg by mouth 2 (two) times daily.   11/22/2023   Coenzyme Q10 300 MG CAPS Take 300 mg by mouth daily.   Past Week   DTx App - Miscellaneous (OMADA GLP1 EXTENDED CARE TRACK) MISC Inject 1 mg as directed once a week. Compound   Taking   estradiol  (VIVELLE -DOT) 0.05 MG/24HR patch Place 1 patch (0.05 mg total) onto the skin 2 (two) times a week. 24 patch 2 11/23/2023 Morning   Magnesium 200 MG TABS Take 200 mg by mouth 2 (two) times daily.   Past Week   Multiple Vitamins-Minerals (MULTIVITAMIN WITH MINERALS) tablet Take 1 tablet by mouth daily. One a day   Past Week   Omega-3 Fatty Acids (FISH OIL PO) Take 1,600 mg by mouth daily.   Past Week   omeprazole (PRILOSEC) 20 MG capsule Take 20 mg by mouth daily.   Taking   progesterone  (PROMETRIUM ) 200 MG capsule Take 1 capsule (200 mg total) by mouth at bedtime. (Patient taking differently: Take 200 mg by mouth every other day.) 90 capsule 2 11/22/2023   rosuvastatin  (CRESTOR ) 10 MG tablet TAKE 1 TABLET(10 MG) BY MOUTH DAILY 90 tablet 3 11/23/2023 Morning   triamcinolone  cream (KENALOG) 0.1 % Apply 1  Application topically daily as needed (Eczema).   Past Week   TURMERIC CURCUMIN PO Take 1 capsule by mouth daily.   Past Week   Ubrogepant  (UBRELVY ) 100 MG TABS Take 1 tablet (100 mg total) by mouth as needed (May repeat in 2 hours if needed.  Maximum 2 tablets in 24 hours.). 10 tablet 11 Past Week   valACYclovir  (VALTREX ) 500 MG tablet Take 1 tablet (500 mg total) by mouth daily. 90 tablet 4 11/22/2023   Vitamin D -Vitamin K (VITAMIN D2 + K1 PO) Take 1 tablet by mouth daily.   Past Week    Review of Systems  Constitutional: Negative.   Respiratory: Negative.    Cardiovascular: Negative.   Psychiatric/Behavioral: Negative.      Blood pressure 129/76, pulse 69, temperature 98.7 F (37.1 C), temperature source Oral, resp. rate 18, height 5' 9 (1.753 m), weight 93 kg,  SpO2 96%. Physical Exam Constitutional:      Appearance: Normal appearance.  Cardiovascular:     Rate and Rhythm: Normal rate and regular rhythm.  Pulmonary:     Effort: Pulmonary effort is normal.     Breath sounds: Normal breath sounds.  Neurological:     General: No focal deficit present.     Mental Status: She is alert.  Psychiatric:        Mood and Affect: Mood normal.     Results for orders placed or performed during the hospital encounter of 11/23/23 (from the past 24 hours)  CBC     Status: None   Collection Time: 11/23/23  9:39 AM  Result Value Ref Range   WBC 9.7 4.0 - 10.5 K/uL   RBC 4.67 3.87 - 5.11 MIL/uL   Hemoglobin 14.7 12.0 - 15.0 g/dL   HCT 56.9 63.9 - 53.9 %   MCV 92.1 80.0 - 100.0 fL   MCH 31.5 26.0 - 34.0 pg   MCHC 34.2 30.0 - 36.0 g/dL   RDW 86.4 88.4 - 84.4 %   Platelets 337 150 - 400 K/uL   nRBC 0.0 0.0 - 0.2 %    No results found.  Assessment/Plan: 58 yo G3P2A1 MWF with postcoital bleeding here for additional evaluation with hysteroscopy, endometrial sampling, endocervical curettage.  Questions answered.  Pt ready to proceed.  Ronal GORMAN Pinal 11/23/2023, 10:08 AM

## 2023-11-24 ENCOUNTER — Encounter (HOSPITAL_COMMUNITY): Payer: Self-pay | Admitting: Obstetrics & Gynecology

## 2023-11-24 LAB — SURGICAL PATHOLOGY

## 2023-11-27 ENCOUNTER — Ambulatory Visit (HOSPITAL_BASED_OUTPATIENT_CLINIC_OR_DEPARTMENT_OTHER): Payer: Self-pay | Admitting: Obstetrics & Gynecology

## 2023-11-27 DIAGNOSIS — R413 Other amnesia: Secondary | ICD-10-CM

## 2023-11-30 ENCOUNTER — Encounter: Payer: Self-pay | Admitting: Neurology

## 2023-11-30 DIAGNOSIS — R413 Other amnesia: Secondary | ICD-10-CM

## 2023-12-01 ENCOUNTER — Encounter: Payer: Self-pay | Admitting: Hematology and Oncology

## 2023-12-08 ENCOUNTER — Other Ambulatory Visit: Payer: Self-pay | Admitting: *Deleted

## 2023-12-08 ENCOUNTER — Other Ambulatory Visit

## 2023-12-08 DIAGNOSIS — D472 Monoclonal gammopathy: Secondary | ICD-10-CM

## 2023-12-08 NOTE — Addendum Note (Signed)
 Addended by: OZELL JESUSA PARAS on: 12/08/2023 10:31 AM   Modules accepted: Orders

## 2023-12-09 LAB — VITAMIN B12: Vitamin B-12: 1344 pg/mL — ABNORMAL HIGH (ref 200–1100)

## 2023-12-11 ENCOUNTER — Ambulatory Visit: Payer: Self-pay | Admitting: Neurology

## 2023-12-11 DIAGNOSIS — R413 Other amnesia: Secondary | ICD-10-CM

## 2023-12-11 NOTE — Progress Notes (Signed)
 Patient advised.

## 2023-12-20 ENCOUNTER — Telehealth: Payer: Self-pay | Admitting: Hematology and Oncology

## 2023-12-25 ENCOUNTER — Inpatient Hospital Stay: Attending: Hematology and Oncology

## 2023-12-25 DIAGNOSIS — D472 Monoclonal gammopathy: Secondary | ICD-10-CM

## 2023-12-25 DIAGNOSIS — Z79899 Other long term (current) drug therapy: Secondary | ICD-10-CM | POA: Diagnosis not present

## 2023-12-26 LAB — KAPPA/LAMBDA LIGHT CHAINS
Kappa free light chain: 54.8 mg/L — ABNORMAL HIGH (ref 3.3–19.4)
Kappa, lambda light chain ratio: 6.16 — ABNORMAL HIGH (ref 0.26–1.65)
Lambda free light chains: 8.9 mg/L (ref 5.7–26.3)

## 2023-12-27 LAB — MULTIPLE MYELOMA PANEL, SERUM
Albumin SerPl Elph-Mcnc: 4 g/dL (ref 2.9–4.4)
Albumin/Glob SerPl: 1 (ref 0.7–1.7)
Alpha 1: 0.2 g/dL (ref 0.0–0.4)
Alpha2 Glob SerPl Elph-Mcnc: 0.8 g/dL (ref 0.4–1.0)
B-Globulin SerPl Elph-Mcnc: 1 g/dL (ref 0.7–1.3)
Gamma Glob SerPl Elph-Mcnc: 2.1 g/dL — ABNORMAL HIGH (ref 0.4–1.8)
Globulin, Total: 4.1 g/dL — ABNORMAL HIGH (ref 2.2–3.9)
IgA: 46 mg/dL — ABNORMAL LOW (ref 87–352)
IgG (Immunoglobin G), Serum: 2401 mg/dL — ABNORMAL HIGH (ref 586–1602)
IgM (Immunoglobulin M), Srm: 101 mg/dL (ref 26–217)
M Protein SerPl Elph-Mcnc: 1.8 g/dL — ABNORMAL HIGH
Total Protein ELP: 8.1 g/dL (ref 6.0–8.5)

## 2024-01-30 ENCOUNTER — Encounter: Payer: Self-pay | Admitting: Psychology

## 2024-01-30 DIAGNOSIS — G43909 Migraine, unspecified, not intractable, without status migrainosus: Secondary | ICD-10-CM | POA: Insufficient documentation

## 2024-01-30 DIAGNOSIS — G4733 Obstructive sleep apnea (adult) (pediatric): Secondary | ICD-10-CM | POA: Insufficient documentation

## 2024-01-30 DIAGNOSIS — R7303 Prediabetes: Secondary | ICD-10-CM | POA: Insufficient documentation

## 2024-01-31 ENCOUNTER — Encounter: Payer: Self-pay | Admitting: Psychology

## 2024-01-31 ENCOUNTER — Ambulatory Visit: Admitting: Psychology

## 2024-01-31 ENCOUNTER — Other Ambulatory Visit (HOSPITAL_BASED_OUTPATIENT_CLINIC_OR_DEPARTMENT_OTHER): Payer: Self-pay

## 2024-01-31 ENCOUNTER — Encounter (HOSPITAL_BASED_OUTPATIENT_CLINIC_OR_DEPARTMENT_OTHER): Payer: Self-pay | Admitting: Obstetrics & Gynecology

## 2024-01-31 ENCOUNTER — Ambulatory Visit: Payer: Self-pay

## 2024-01-31 DIAGNOSIS — F4329 Adjustment disorder with other symptoms: Secondary | ICD-10-CM

## 2024-01-31 DIAGNOSIS — Z9229 Personal history of other drug therapy: Secondary | ICD-10-CM

## 2024-01-31 DIAGNOSIS — R4189 Other symptoms and signs involving cognitive functions and awareness: Secondary | ICD-10-CM | POA: Diagnosis not present

## 2024-01-31 MED ORDER — ESTRADIOL 0.05 MG/24HR TD PTTW: 1.0000 | MEDICATED_PATCH | TRANSDERMAL | 2 refills | Status: AC

## 2024-01-31 NOTE — Progress Notes (Signed)
 "   NEUROPSYCHOLOGICAL EVALUATION [place]. [facility] Department of Neurology  Date of Evaluation: January 31, 2024  Reason for Referral:   Shaleta Ruacho is a 59 y.o. right-handed Caucasian female referred by Juliene Dunnings, D.O., to characterize her current cognitive functioning and assist with diagnostic clarity and treatment planning in the context of subjective cognitive decline.   Assessment and Plan:   Clinical Impression(s): Ms. Scantling pattern of performance is suggestive of neuropsychological functioning within normal limits relative to age-matched peers. Performances across all cognitive domains were normatively appropriate. This includes processing speed, attention/concentration, executive functioning, receptive and expressive language, visuospatial abilities, and all aspects of learning and memory. Functionally, Ms. Boliver denied difficulties completing instrumental activities of daily living (ADLs) independently. She does not warrant consideration for a neurocognitive disorder at the present time.   Subjective day-to-day difficulties are most likely due to a variety of biopsychosocial stressors, superimposed upon the normal aging process. Acutely, she reported a significant degree stress stemming from her husband obtaining a new job in [place]  and them currently juggling two homes and other associated responsibilities. She also described a more longstanding history of sleep dysfunction, mild psychiatric distress, and headache symptoms. Day-to-day trouble with information retrieval and word finding would represent very common concerns stemming from these variables.  Specific to memory, Ms. Crusoe was able to learn novel verbal and visual information efficiently and retained this knowledge after lengthy delays. Overall, memory performance combined with intact performances across other areas of cognitive functioning is not suggestive of presently symptomatic  Alzheimer's disease. Likewise, her cognitive and behavioral profile is not suggestive of any other form of neurodegenerative illness presently.  Recommendations: Ms. Glantz is encouraged to attend to lifestyle factors for brain health (e.g., regular physical exercise, good nutrition habits and consideration of the MIND-DASH diet, regular participation in cognitively-stimulating activities, and general stress management techniques), which are likely to have benefits for both emotional adjustment and cognition. In fact, in addition to promoting good general health, regular exercise incorporating aerobic activities (e.g., brisk walking, jogging, cycling, etc.) has been demonstrated to be a very effective treatment for depression and stress, with similar efficacy rates to both antidepressant medication and psychotherapy. Optimal control of vascular risk factors (including safe cardiovascular exercise and adherence to dietary recommendations) is encouraged. Likewise, continued compliance with her mouthguard device to treat sleep apnea will be important. Continued participation in activities which provide mental stimulation and social interaction is also recommended.   If interested, there are some activities which have therapeutic value and can be useful in keeping her cognitively stimulated. For suggestions, Ms. Shands is encouraged to go to the following website: https://www.[facility].org/get-to-know-barrow/centers-programs/neurorehabilitation-center/neuro-rehab-apps-and-games/ which has smart phone/tablet based options. It should be noted that these activities should not be viewed as a substitute for therapy.  Memory can be improved using internal strategies such as rehearsal, repetition, chunking, mnemonics, association, and imagery. External strategies such as written notes in a consistently used memory journal, visual and nonverbal auditory cues such as a calendar on the refrigerator or appointments with  alarm, such as on a cell phone, can also help maximize recall.    To address day-to-day problems with fluctuating attention and/or executive dysfunction, she may wish to consider:   -Avoiding external distractions when needing to concentrate   -Limiting exposure to fast paced environments with multiple sensory demands   -Writing down complicated information and using checklists   -Attempting and completing one task at a time (i.e., no multi-tasking)   -  Verbalizing aloud each step of a task to maintain focus   -Taking frequent breaks during the completion of steps/tasks to avoid fatigue   -Reducing the amount of information considered at one time   -Scheduling more difficult activities for a time of day where she is usually most alert  Reducing anxiety and/or stress may also aid in the retrieval of information. For word finding, she is encouraged to prepare scripts she can use socially when she experiences difficulty with word finding or memory. Such scripts should be brief explanations of the difficulty (e.g., the word escapes me now) and allow her to move the conversation forward quickly rather than dwelling on the issue.  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 10/21/20   Obstructive sleep apnea, mild    Postcoital bleeding 11/23/2023   Pre-diabetes    Situational anxiety    Somatic dysfunction of spine, cervical 06/10/2021   Subjective memory complaints 01/31/2024    Past Surgical History:  Procedure Laterality Date   BLADDER SUSPENSION N/A 10/26/2020   Procedure: TRANSVAGINAL TAPE (TVT) PROCEDURE;  Surgeon: Marilynne Rosaline SAILOR, MD;  Location[facility];  Service: Gynecology;  Laterality: N/A;   BREAST REDUCTION SURGERY Bilateral 01/13/2022   wtih abdominoplasty   BUNIONECTOMY Bilateral    CHOLECYSTECTOMY  2001   CYSTOSCOPY N/A 10/26/2020   Procedure: CYSTOSCOPY;  Surgeon: Marilynne Rosaline SAILOR, MD;  Location: [facility];  Service: Gynecology;  Laterality: N/A;   HYSTEROSCOPY WITH D & C N/A 11/23/2023   Procedure: DILATATION AND CURETTAGE /HYSTEROSCOPY;  Surgeon: Cleotilde Ronal RAMAN, MD;  Location: [facility] OR;  Service: Gynecology;  Laterality: N/A;  hysteroscopy with dilation and curettage, endocervical curettage   MYOSURE RESECTION  11/23/2023   Procedure: MYOSURE RESECTION;  Surgeon: Cleotilde Ronal RAMAN, MD;  Location: [facility] OR;  Service: Gynecology;;   NASAL SEPTUM SURGERY     RECTOCELE REPAIR N/A 10/26/2020   Procedure: POSTERIOR REPAIR (RECTOCELE) with perineorrhaphy;  Surgeon: Marilynne Rosaline SAILOR, MD;  Location: [facility];  Service: Gynecology;  Laterality: N/A;  total time requested for all procedures is 1.5 hours   REDUCTION MAMMAPLASTY     TONSILLECTOMY      Current Outpatient Medications:    AIMOVIG  140 MG/ML SOAJ, INJECT 140 MG INTO THE SKIN EVERY 28 DAYS, Disp: 1 mL, Rfl: 11   Ascorbic Acid (VITAMIN C PO), Take 1 capsule by mouth daily. Super C, Disp: , Rfl:    betamethasone dipropionate 0.05 % cream, Apply 1 Application topically daily as needed (Ecezma)., Disp: ,  Rfl:     buPROPion (WELLBUTRIN SR) 150 MG 12 hr tablet, Take 150 mg by mouth 2 (two) times daily., Disp: , Rfl:    Coenzyme Q10 300 MG CAPS, Take 300 mg by mouth daily., Disp: , Rfl:    DTx App - Miscellaneous (OMADA GLP1 EXTENDED CARE TRACK) MISC, Inject 1 mg as directed once a week. Compound, Disp: , Rfl:    [START ON 02/01/2024] estradiol  (VIVELLE -DOT) 0.05 MG/24HR patch, Place 1 patch (0.05 mg total) onto the skin 2 (two) times a week., Disp: 24 patch, Rfl: 2   HYDROcodone -acetaminophen  (NORCO/VICODIN) 5-325 MG tablet, Take 1-2 tablets by mouth every 6 (six) hours as needed., Disp: 10 tablet, Rfl: 0   ibuprofen  (ADVIL ) 800 MG tablet, Take 1 tablet (800 mg total) by mouth every 8 (eight) hours as needed., Disp: 20 tablet, Rfl: 0   Magnesium 200 MG TABS, Take 200 mg by mouth 2 (two) times daily., Disp: , Rfl:    Multiple Vitamins-Minerals (MULTIVITAMIN WITH MINERALS) tablet, Take 1 tablet by mouth daily. One a day, Disp: , Rfl:    Omega-3 Fatty Acids (FISH OIL PO), Take 1,600 mg by mouth daily., Disp: , Rfl:    omeprazole (PRILOSEC) 20 MG capsule, Take 20 mg by mouth daily., Disp: , Rfl:    progesterone  (PROMETRIUM ) 200 MG capsule, Take 1 capsule (200 mg total) by mouth at bedtime. (Patient taking differently: Take 200 mg by mouth every other day.), Disp: 90 capsule, Rfl: 2   rosuvastatin  (CRESTOR ) 10 MG tablet, TAKE 1 TABLET(10 MG) BY MOUTH DAILY, Disp: 90 tablet, Rfl: 3   triamcinolone  cream (KENALOG) 0.1 %, Apply 1 Application topically daily as needed (Eczema)., Disp: , Rfl:    TURMERIC CURCUMIN PO, Take 1 capsule by mouth daily., Disp: , Rfl:    Ubrogepant  (UBRELVY ) 100 MG TABS, Take 1 tablet (100 mg total) by mouth as needed (May repeat in 2 hours if needed.  Maximum 2 tablets in 24 hours.)., Disp: 10 tablet, Rfl: 11   valACYclovir  (VALTREX ) 500 MG tablet, Take 1 tablet (500 mg total) by mouth daily., Disp: 90 tablet, Rfl: 4   Vitamin D -Vitamin K (VITAMIN D2 + K1 PO), Take 1 tablet by mouth daily.,  Disp: , Rfl:   Neuroimaging: Brain MRI/MRA on 05/19/2021 was unremarkable.   Clinical Interview:   The following information was obtained during a clinical interview with Ms. Laur prior to cognitive testing.  Cognitive Symptoms: Decreased short-term memory: Endorsed. Primary concerns surrounding trouble with information retrieval. She provided examples surrounding an instance where she was unable to recall the company her daughter works for, as well as a separate instance where she did not immediately remember that a particular date was her daughter's birthday while scheduling another appointment. She also described instances where she will enter rooms or look on her phone and forget her original intention.  Decreased long-term memory: Denied. Decreased attention/concentration: Endorsed. She reported some distractibility. At least some of this was attributed to recent stressors. She reported that her husband had recently gotten a job in [place]  and that they were currently juggling two residences and other associated responsibilities.  Reduced processing speed: Denied. However, she did remark that information retrieval can appear more effortful lately which can impact the speed/efficiency of information processing.  Difficulties with executive functions: Denied. Difficulties with emotion regulation: Denied. She also denied any significant personality changes.  Difficulties with receptive language: Denied. Difficulties with word finding: Endorsed. She described broad word finding difficulties, as well as  more specific instances where she will say a unintended word while speaking. These are generally appropriate and semantically related (e.g., halftime instead of intermission or group instead of team). However, they seem to be happening more frequently which has raised subjective concern.  Decreased visuoperceptual ability: Denied.  Difficulties completing ADLs: Denied.  Additional Medical  History: History of traumatic brain injury/concussion: Denied. History of stroke: Denied. History of seizure activity: Denied. History of known exposure to toxins: Denied. Symptoms of chronic pain: Denied. Experience of frequent headaches/migraines: She has a somewhat longstanding history of migraine headaches. These had been treated with topiramate  a few years prior. This was discontinued due to concerns surrounding cognitive side effects. She has been taking Aimovig  recently which has been quite successful in managing migraine experiences. She noted an experience around Christmas but that this represented her first migraine in quite some time. She also remarked that smaller headache experiences also seem less frequent with current medication intervention.  Frequent instances of dizziness/vertigo: She noted lightheadedness at times, primarily when standing or arising quickly. The cause for this was unknown as she and her medical team have seemingly ruled out blood pressure concerns as a primary culprit.   Sensory changes: She has had LASIK performed in the past and can see adequately. She noted a longstanding sensory thing where she can become easily overwhelmed where there are multiple sources of noise occurring spontaneously. Outside of this, hearing loss was largely denied. Other sensory changes/difficulties (e.g., taste or smell) were denied.  Balance/coordination difficulties: Denied. She also denied any recent falls. Other motor difficulties: She reported a very subtle and occasional postural tremor impacting her hands bilaterally. She also reported her perception of greater weakness in her hands and that she has been dropping things more frequently lately.   Sleep History: Estimated hours obtained each night: Unclear. She described her sleep as inconsistent. Difficulties falling asleep: Denied. Difficulties staying asleep: Endorsed. She reported being an early riser regardless of when she  goes to bed.  Feels rested and refreshed upon awakening: Variably so depending on the quantity and quality of sleep obtained the night before. She did comment having more restful than non-restful nights on average.   History of snoring: Endorsed. History of waking up gasping for air: Endorsed. Witnessed breath cessation while asleep: Endorsed. She had a sleep study performed and was diagnosed with mild obstructive sleep apnea. This condition is currently treated with a mouthguard device.   History of vivid dreaming: Denied. Excessive movement while asleep: Denied. Instances of acting out her dreams: Denied.  Psychiatric/Behavioral Health History: Depression: She described a longstanding history of generally mild depressive symptoms, starting following the birth of one of her daughters. She has been on Wellbutrin for 20+ years. She noted a previous attempt to get off this medication; however, her mood deteriorated and she went back on. Overall, current mood symptoms appear well managed. She described her current mood as pretty good. Current or remote suicidal ideation, intent, or plan was denied.  Anxiety: She reported situational anxiety over time. Current stressors surrounded her juggling two residences following her husband getting a new job in [place] . She also described acute stress surrounding subjective cognitive decline, as well as the current political climate.  Mania: Denied. Trauma History: Denied. Visual/auditory hallucinations: Denied. Delusional thoughts: Denied.  Tobacco: Denied. Alcohol: She reported social alcohol consumption and denied a history of problematic alcohol abuse or dependence.  Recreational drugs: Denied.  Family History: Problem Relation Age of Onset   Diabetes Mother  COPD Mother    Congestive Heart Failure Mother    Thyroid disease Mother    Memory loss Mother    Hemochromatosis Father    Alcoholism Father    Breast cancer Sister 30       invasive  lobular breast cancer, negative genetic testing   Diabetes Sister    Asthma Sister    Asthma Brother    Dementia Maternal Grandmother    Heart attack Maternal Grandfather    Diabetes Maternal Aunt    Breast cancer Maternal Aunt    Diabetes Other    This information was confirmed by Ms. Dutko.  Academic/Vocational History: Highest level of educational attainment: 16 years. She graduated from high school and earned a Oncologist in geologist, engineering. She described herself as not a good student in academic settings. She reported attending early education in a setting where multiple grades were included within the same room, which was distracting and not a conducive learning environment. Academic performance did seem to improve when attending more traditional school environments in [employer]. She estimated her high school GPA being around 2.9. Math was noted as a likely relative weakness throughout academic settings. History of developmental delay: Denied. History of grade repetition: Denied. Enrollment in special education courses: Denied. History of LD/ADHD: Denied.  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 Following the birth of her daughters, she remained at home. However, she is heavily involved in various volunteer positions currently, often holding leadership positions with high demands.   Evaluation Results:   Behavioral Observations: Ms. Ferrie was unaccompanied, arrived to her appointment on time, and was appropriately dressed and groomed. She appeared alert. Observed gait and station were within normal limits. Gross motor functioning appeared intact upon informal observation and no abnormal movements (e.g., tremors) were noted. Her affect was relaxed and positive. Spontaneous speech was fluent and word finding difficulties were not observed during the clinical interview. Thought processes were coherent, organized, and normal in content.  Insight into her cognitive difficulties appeared adequate.   During testing, sustained attention was appropriate. Task engagement was adequate and she persisted when challenged. Overall, Ms. Kastelic was cooperative with the clinical interview and subsequent testing procedures.   Adequacy of Effort: The validity of neuropsychological testing is limited by the extent to which the individual being tested may be assumed to have exerted adequate effort during testing. Ms. Buda expressed her intention to perform to the best of her abilities and exhibited adequate task engagement and persistence. Scores across stand-alone and embedded performance validity measures were within expectation. As such, the results of the current evaluation are believed to be a valid representation of Ms. Torbert's current cognitive functioning.  Test Results: Ms. Kearn was fully oriented at the time of the current evaluation.  Intellectual abilities based upon educational and vocational attainment were estimated to be in the average range. Premorbid abilities were estimated to be within the average range based upon a single-word reading test.   Processing speed was average to above average. Basic attention was average. More complex attention (e.g., working memory) was above average. Executive functioning was average to exceptionally high.  While not directly assessed, receptive language abilities were believed to be intact. Ms. Dimauro did not exhibit any difficulties comprehending task instructions and answered all questions asked of her appropriately. Assessed expressive language (e.g., verbal fluency and confrontation naming) was average to exceptionally high.     Assessed visuospatial/visuoconstructional abilities were average.    Learning (i.e., encoding) of novel  verbal and visual information was mildly variable but overall appropriate, ranging from the below average to above average normative ranges. Spontaneous  delayed recall (i.e., retrieval) of previously learned information was average to well above average. Retention rates were 100% across a list learning task, 103% across a story learning task, 100% across a daily living task, and 113% across a shape learning task. Performance across recognition tasks was average to above average, suggesting evidence for information consolidation.   Results of emotional screening instruments suggested that recent symptoms of generalized anxiety were in the minimal range, while symptoms of depression were within normal limits. A screening instrument assessing recent sleep quality suggested the presence of minimal sleep dysfunction.  Table of Scores:   Note: This summary of test scores accompanies the interpretive report and should not be considered in isolation without reference to the appropriate sections in the text. Descriptors are based on appropriate normative data and may be adjusted based on clinical judgment. Terms such as Within Normal Limits and Outside Normal Limits are used when a more specific description of the test score cannot be determined.       Percentile - Normative Descriptor > 98 - Exceptionally High 91-97 - Well Above Average 75-90 - Above Average 25-74 - Average 9-24 - Below Average 2-8 - Well Below Average < 2 - Exceptionally Low       Orientation:      Raw Score Percentile   NAB Orientation, Form 1 29/29 --- ---       Intellectual Functioning:      Standard Score Percentile   Test of Premorbid Functioning: 101 53 Average       Memory:     NAB Memory Module, Form 1: Standard Score/ T Score Percentile   Total Memory Index 109 73 Average  List Learning       Total Trials 1-3 31/36 (61) 86 Above Average    List B 6/12 (53) 62 Average    Short Delay Free Recall 12/12 (65) 93 Well Above Average    Long Delay Free Recall 12/12 (65) 93 Well Above Average    Retention Percentage 100 (51) 54 Average    Recognition Discriminability  12 (61) 86 Above Average  Shape Learning       Total Trials 1-3 22/27 (62) 88 Above Average    Delayed Recall 9/9 (64) 92 Well Above Average    Retention Percentage 113 (55) 69 Average    Recognition Discriminability 9 (62) 88 Above Average  Story Learning       Immediate Recall 60/80 (42) 21 Below Average    Delayed Recall 33/40 (44) 27 Average    Retention Percentage 103 (57) 75 Above Average  Daily Living Memory       Immediate Recall 43/51 (44) 27 Average    Delayed Recall 15/17 (47) 38 Average    Retention Percentage 100 (57) 75 Above Average    Recognition Hits 9/10 (50) 50 Average       Attention/Executive Function:     Trail Making Test (TMT): Raw Score (T Score) Percentile     Part A 24 secs.,  0 errors (55) 69 Average    Part B 58 secs.,  1 error (51) 54 Average         Scaled Score Percentile   WAIS-5 Coding: 11 63 Average  WAIS-5 Naming Speed Quantity: 12 75 Above Average       NAB Attention Module, Form 1: T Score Percentile  Digits Forward 43 25 Average    Digits Backwards 57 75 Above Average        Scaled Score Percentile   WAIS-5 Matrix Reasoning: 13 84 Above Average       D-KEFS Color-Word Interference Test: Raw Score (Scaled Score) Percentile     Color Naming 29 secs. (10) 50 Average    Word Reading 20 secs. (12) 75 Above Average    Inhibition 66 secs. (9) 37 Average      Total Errors 3 errors (8) 25 Average    Inhibition/Switching 59 secs. (12) 75 Above Average      Total Errors 0 errors (12) 75 Above Average       D-KEFS Verbal Fluency Test: Raw Score (Scaled Score) Percentile     Letter Total Correct 65 (18) >99 Exceptionally High    Category Total Correct 55 (17) 99 Exceptionally High    Category Switching Total Correct 19 (17) 99 Exceptionally High    Category Switching Accuracy 18 (16) 98 Exceptionally High      Total Set Loss Errors 1 (11) 63 Average      Total Repetition Errors 5 (8) 25 Average       D-KEFS 20 Questions Test: Scaled  Score Percentile     Total Weighted Achievement Score 13 84 Above Average    Initial Abstraction Score 11 63 Average       Language:     Verbal Fluency Test: Raw Score (T Score) Percentile     Phonemic Fluency (FAS) 65 (66) 95 Well Above Average    Animal Fluency 29 (63) 91 Well Above Average        NAB Language Module, Form 1: T Score Percentile     Naming 31/31 (53) 62 Average       Visuospatial/Visuoconstruction:      Raw Score Percentile   Clock Drawing: 10/10 --- Within Normal Limits       NAB Spatial Module, Form 1: T Score Percentile     Figure Drawing Copy 54 66 Average        Scaled Score Percentile   WAIS-5 Visual Puzzles: 10 50 Average       Mood and Personality:      Raw Score Percentile   Beck Depression Inventory - II: 8 --- Within Normal Limits  PROMIS Anxiety Questionnaire: 13 --- None to Slight       Additional Questionnaires:      Raw Score Percentile   PROMIS Sleep Disturbance Questionnaire: 20 --- None to Slight   Informed Consent and Coding/Compliance:   The current evaluation represents a clinical evaluation for the purposes previously outlined by the referral source and is in no way reflective of a forensic evaluation.   Ms. Sanzone was provided with a verbal description of the nature and purpose of the present neuropsychological evaluation. Also reviewed were the foreseeable risks and/or discomforts and benefits of the procedure, limits of confidentiality, and mandatory reporting requirements of this provider. The patient was given the opportunity to ask questions and receive answers about the evaluation. Oral consent to participate was provided by the patient.   This evaluation was conducted by Arthea KYM Maryland, Ph.D., ABPP-CN, board certified clinical neuropsychologist. Ms. Kleist completed a clinical interview with Dr. Maryland, billed as one unit [phone], and 122 minutes of cognitive testing and scoring, billed as one unit [phone] and three additional units  [id number]. As a separate and discrete service, one unit [id number] and two units 96133 (155 minutes) were billed  for Dr. Loralee time spent in interpretation and report writing.      "

## 2024-02-09 ENCOUNTER — Ambulatory Visit: Admitting: Psychology

## 2024-02-09 ENCOUNTER — Encounter: Payer: Self-pay | Admitting: Neurology

## 2024-02-09 DIAGNOSIS — R4189 Other symptoms and signs involving cognitive functions and awareness: Secondary | ICD-10-CM | POA: Diagnosis not present

## 2024-02-09 NOTE — Progress Notes (Signed)
"  ° °  Neuropsychology Feedback Session Gwendolyn Ramos. [facility] [place] Department of Neurology  Reason for Referral:   Gwendolyn Ramos is a 59 y.o. right-handed Caucasian female referred by Juliene Dunnings, D.O., to characterize her current cognitive functioning and assist with diagnostic clarity and treatment planning in the context of subjective cognitive decline.   Feedback:   Gwendolyn Ramos completed a comprehensive neuropsychological evaluation on 01/31/2024. Please refer to that encounter for the full report and recommendations. Briefly, results suggested neuropsychological functioning within normal limits relative to age-matched peers. Performances across all cognitive domains were normatively appropriate. This includes processing speed, attention/concentration, executive functioning, receptive and expressive language, visuospatial abilities, and all aspects of learning and memory. Subjective day-to-day difficulties are most likely due to a variety of biopsychosocial stressors, superimposed upon the normal aging process. Acutely, she reported a significant degree stress stemming from her husband obtaining a new job in [place]  and them currently juggling two homes and other associated responsibilities. She also described a more longstanding history of sleep dysfunction, mild psychiatric distress, and headache symptoms. Day-to-day trouble with information retrieval and word finding would represent very common concerns stemming from these variables.  Gwendolyn Ramos was unaccompanied during the current feedback session. Content of the current session focused on the results of her neuropsychological evaluation. Gwendolyn Ramos was given the opportunity to ask questions and her questions were answered. She was encouraged to reach out should additional questions arise. A copy of her report was provided at the conclusion of the visit.      One unit 96132 (31 minutes) was billed for Dr. Loralee time  spent preparing for, conducting, and documenting the current feedback session with Gwendolyn Ramos. "

## 2024-02-12 MED ORDER — AJOVY 225 MG/1.5ML ~~LOC~~ SOAJ: 225.0000 mg | SUBCUTANEOUS | 5 refills | Status: AC

## 2024-02-13 ENCOUNTER — Other Ambulatory Visit (HOSPITAL_COMMUNITY): Payer: Self-pay

## 2024-02-13 NOTE — Telephone Encounter (Signed)
 Test billing results with updated insurance information through [facility] for [place]  was successful with no PA. Pt should be able to fill at pharmacy of their choice.

## 2024-03-25 ENCOUNTER — Ambulatory Visit (HOSPITAL_BASED_OUTPATIENT_CLINIC_OR_DEPARTMENT_OTHER): Payer: BC Managed Care – PPO | Admitting: Obstetrics & Gynecology

## 2024-04-16 ENCOUNTER — Ambulatory Visit (HOSPITAL_BASED_OUTPATIENT_CLINIC_OR_DEPARTMENT_OTHER): Admitting: Obstetrics & Gynecology

## 2024-05-22 ENCOUNTER — Inpatient Hospital Stay: Payer: Self-pay

## 2024-05-31 ENCOUNTER — Inpatient Hospital Stay: Payer: Self-pay | Admitting: Physician Assistant

## 2024-08-21 ENCOUNTER — Ambulatory Visit: Admitting: Neurology
